# Patient Record
Sex: Female | Born: 1970
Health system: Southern US, Community
[De-identification: ages and names within clinical notes are randomized; demographics above are authoritative.]

## PROBLEM LIST (undated history)

## (undated) DIAGNOSIS — F32A Depression, unspecified: Secondary | ICD-10-CM

## (undated) DIAGNOSIS — K7581 Nonalcoholic steatohepatitis (NASH): Secondary | ICD-10-CM

## (undated) DIAGNOSIS — N6012 Diffuse cystic mastopathy of left breast: Secondary | ICD-10-CM

## (undated) DIAGNOSIS — E119 Type 2 diabetes mellitus without complications: Secondary | ICD-10-CM

## (undated) DIAGNOSIS — I10 Essential (primary) hypertension: Secondary | ICD-10-CM

## (undated) DIAGNOSIS — M069 Rheumatoid arthritis, unspecified: Secondary | ICD-10-CM

## (undated) DIAGNOSIS — R7303 Prediabetes: Secondary | ICD-10-CM

## (undated) DIAGNOSIS — Z87448 Personal history of other diseases of urinary system: Secondary | ICD-10-CM

## (undated) DIAGNOSIS — K760 Fatty (change of) liver, not elsewhere classified: Secondary | ICD-10-CM

## (undated) DIAGNOSIS — K635 Polyp of colon: Secondary | ICD-10-CM

## (undated) DIAGNOSIS — A630 Anogenital (venereal) warts: Secondary | ICD-10-CM

## (undated) DIAGNOSIS — F329 Major depressive disorder, single episode, unspecified: Secondary | ICD-10-CM

## (undated) DIAGNOSIS — F419 Anxiety disorder, unspecified: Secondary | ICD-10-CM

## (undated) DIAGNOSIS — K219 Gastro-esophageal reflux disease without esophagitis: Secondary | ICD-10-CM

## (undated) DIAGNOSIS — N951 Menopausal and female climacteric states: Secondary | ICD-10-CM

## (undated) HISTORY — DX: Anogenital (venereal) warts: A63.0

## (undated) HISTORY — PX: TOE SURGERY: SHX1073

## (undated) HISTORY — DX: Nonalcoholic steatohepatitis (NASH): K75.81

## (undated) HISTORY — PX: ABDOMINAL HYSTERECTOMY: SHX81

## (undated) HISTORY — DX: Fatty (change of) liver, not elsewhere classified: K76.0

## (undated) HISTORY — PX: LIVER BIOPSY: SHX301

## (undated) HISTORY — DX: Rheumatoid arthritis, unspecified: M06.9

## (undated) HISTORY — DX: Depression, unspecified: F32.A

## (undated) HISTORY — DX: Personal history of other diseases of urinary system: Z87.448

## (undated) HISTORY — PX: WISDOM TOOTH EXTRACTION: SHX21

## (undated) HISTORY — DX: Diffuse cystic mastopathy of left breast: N60.12

## (undated) HISTORY — DX: Anxiety disorder, unspecified: F41.9

## (undated) HISTORY — DX: Prediabetes: R73.03

## (undated) HISTORY — DX: Major depressive disorder, single episode, unspecified: F32.9

## (undated) HISTORY — DX: Menopausal and female climacteric states: N95.1

## (undated) HISTORY — PX: FOOT SURGERY: SHX648

## (undated) HISTORY — DX: Polyp of colon: K63.5

---

## 1998-03-30 DIAGNOSIS — K7581 Nonalcoholic steatohepatitis (NASH): Secondary | ICD-10-CM

## 1998-03-30 HISTORY — DX: Nonalcoholic steatohepatitis (NASH): K75.81

## 2000-11-12 ENCOUNTER — Other Ambulatory Visit: Admission: RE | Admit: 2000-11-12 | Discharge: 2000-11-12 | Payer: Self-pay | Admitting: Obstetrics and Gynecology

## 2001-03-30 DIAGNOSIS — Z87448 Personal history of other diseases of urinary system: Secondary | ICD-10-CM

## 2001-03-30 HISTORY — DX: Personal history of other diseases of urinary system: Z87.448

## 2001-12-08 ENCOUNTER — Inpatient Hospital Stay (HOSPITAL_COMMUNITY): Admission: AD | Admit: 2001-12-08 | Discharge: 2001-12-10 | Payer: Self-pay | Admitting: Obstetrics and Gynecology

## 2003-03-31 HISTORY — PX: PANNICULECTOMY: SUR1001

## 2003-03-31 HISTORY — PX: OTHER SURGICAL HISTORY: SHX169

## 2003-09-13 ENCOUNTER — Inpatient Hospital Stay (HOSPITAL_COMMUNITY): Admission: RE | Admit: 2003-09-13 | Discharge: 2003-09-16 | Payer: Self-pay | Admitting: Obstetrics and Gynecology

## 2003-10-29 ENCOUNTER — Ambulatory Visit (HOSPITAL_COMMUNITY): Admission: RE | Admit: 2003-10-29 | Discharge: 2003-10-29 | Payer: Self-pay | Admitting: Obstetrics and Gynecology

## 2008-08-31 ENCOUNTER — Ambulatory Visit (HOSPITAL_COMMUNITY): Admission: RE | Admit: 2008-08-31 | Discharge: 2008-08-31 | Payer: Self-pay | Admitting: Obstetrics & Gynecology

## 2009-01-11 ENCOUNTER — Ambulatory Visit (HOSPITAL_COMMUNITY): Admission: RE | Admit: 2009-01-11 | Discharge: 2009-01-11 | Payer: Self-pay | Admitting: Obstetrics and Gynecology

## 2010-01-13 ENCOUNTER — Ambulatory Visit (HOSPITAL_COMMUNITY): Admission: RE | Admit: 2010-01-13 | Discharge: 2010-01-13 | Payer: Self-pay | Admitting: Obstetrics and Gynecology

## 2010-01-22 ENCOUNTER — Ambulatory Visit (HOSPITAL_COMMUNITY): Admission: RE | Admit: 2010-01-22 | Discharge: 2010-01-22 | Payer: Self-pay | Admitting: Obstetrics and Gynecology

## 2010-05-29 ENCOUNTER — Other Ambulatory Visit: Payer: Self-pay | Admitting: Obstetrics & Gynecology

## 2010-05-29 DIAGNOSIS — K625 Hemorrhage of anus and rectum: Secondary | ICD-10-CM

## 2010-05-29 DIAGNOSIS — R140 Abdominal distension (gaseous): Secondary | ICD-10-CM

## 2010-05-29 HISTORY — PX: ESOPHAGOGASTRODUODENOSCOPY: SHX1529

## 2010-05-29 HISTORY — PX: COLONOSCOPY: SHX174

## 2010-06-02 ENCOUNTER — Ambulatory Visit (HOSPITAL_COMMUNITY)
Admission: RE | Admit: 2010-06-02 | Discharge: 2010-06-02 | Disposition: A | Discharge status: Home / Self Care | Payer: BC Managed Care – PPO | Source: Ambulatory Visit | Attending: Obstetrics & Gynecology | Admitting: Obstetrics & Gynecology

## 2010-06-02 ENCOUNTER — Encounter (HOSPITAL_COMMUNITY): Payer: Self-pay

## 2010-06-02 DIAGNOSIS — R142 Eructation: Secondary | ICD-10-CM | POA: Insufficient documentation

## 2010-06-02 DIAGNOSIS — K429 Umbilical hernia without obstruction or gangrene: Secondary | ICD-10-CM | POA: Insufficient documentation

## 2010-06-02 DIAGNOSIS — R140 Abdominal distension (gaseous): Secondary | ICD-10-CM

## 2010-06-02 DIAGNOSIS — R141 Gas pain: Secondary | ICD-10-CM | POA: Insufficient documentation

## 2010-06-02 DIAGNOSIS — K625 Hemorrhage of anus and rectum: Secondary | ICD-10-CM

## 2010-06-02 DIAGNOSIS — R143 Flatulence: Secondary | ICD-10-CM | POA: Insufficient documentation

## 2010-06-02 MED ORDER — IOHEXOL 300 MG/ML  SOLN
100.0000 mL | Freq: Once | INTRAMUSCULAR | Status: AC | PRN
  Administered 2010-06-02: 100 mL via INTRAVENOUS

## 2010-06-11 ENCOUNTER — Ambulatory Visit (INDEPENDENT_AMBULATORY_CARE_PROVIDER_SITE_OTHER): Payer: BC Managed Care – PPO | Admitting: Gastroenterology

## 2010-06-11 ENCOUNTER — Encounter: Payer: Self-pay | Admitting: Gastroenterology

## 2010-06-11 DIAGNOSIS — R1013 Epigastric pain: Secondary | ICD-10-CM | POA: Insufficient documentation

## 2010-06-11 DIAGNOSIS — K7689 Other specified diseases of liver: Secondary | ICD-10-CM

## 2010-06-11 DIAGNOSIS — K921 Melena: Secondary | ICD-10-CM

## 2010-06-11 DIAGNOSIS — K7581 Nonalcoholic steatohepatitis (NASH): Secondary | ICD-10-CM | POA: Insufficient documentation

## 2010-06-13 ENCOUNTER — Encounter: Payer: Self-pay | Admitting: Internal Medicine

## 2010-06-13 ENCOUNTER — Encounter: Payer: Self-pay | Admitting: Gastroenterology

## 2010-06-16 ENCOUNTER — Other Ambulatory Visit: Payer: Self-pay | Admitting: Obstetrics and Gynecology

## 2010-06-16 DIAGNOSIS — Z09 Encounter for follow-up examination after completed treatment for conditions other than malignant neoplasm: Secondary | ICD-10-CM

## 2010-06-17 NOTE — Assessment & Plan Note (Signed)
Summary: POSTIVE HEME STOOL/RECTAL BLEEDING/LAW   Vital Signs:  Patient profile:   40 year old female Height:      61 inches Weight:      165 pounds BMI:     31.29 Temp:     98.6 degrees F oral Pulse rate:   84 / minute BP sitting:   140 / 100  (left arm)  Vitals Entered By: Loney Loh LPN (June 11, 7827 5:62 PM)  Visit Type:  Consult Referring Provider:  Sharin [facility]  Chief Complaint:  heme positive stool and rectal bleeding.  History of Present Illness: Loretta Schaefer is a pleasant 40 y/o female who presents today at request of Derrek Monaco ANP/GNP, for further evaluation of rectal bleeding/heme positive stool. Patient developed BRBPR for three days. Never had TCS. On DRE, possible internal hemorrhoid. Denies constipation, diarrhea. Does complain of bloating and upper abd discomfort. No heartburn, melena, n/v.  CT A/P was done. She had 62m hypodensity in anterior segment right hepatic lobe not seen on study in 2005. Possibly a cyst or hemangioma. Focal fat in lateral segment left hepatic lobe, 347mright mid renal cortical hypodensity, stable. She developed itching and hives with the IV contrast.   Labs 05/29/10: WBC 6500, H/H 13.9/41.1, Plt 279,000, sed rate 5, CMET normal.  Has f/u with Dr. FeJohnnye Simaffice next week to f/u BP.   Current Medications (verified): 1)  Bupropion Hcl 150 Mg Xr12h-Tab (Bupropion Hcl) .... Take One Two Times A Day 2)  Daily Multi  Tabs (Multiple Vitamins-Minerals) .... Take One Once Daily 3)  Melatonex 3-10 Mg Cr-Tabs (Melatonin-Pyridoxine) .... Take One At Bed Time 4)  Vitamin B-12 1000 Mcg Tabs (Cyanocobalamin) .... Take One Once Daily 5)  Omeprazole 10 Mg Cpdr (Omeprazole) .... Take One Prn 6)  Advil 200 Mg Tabs (Ibuprofen) .... Prn  Allergies (verified): 1)  ! * Ivp Dye  Past History:  Past Medical History: Steatohepatitis without cirrhosis, bx 2000 H/O right pyelonephritis, 2003 H/O HTN  Past Surgical  History: Hysterectomy and panniculectomy, 2005 C-section, two  Family History: Mother and maternal aunt had steatohepatitis with cirrhosis. Mother passed away two years ago. Mother had colon polyps before age of 42 Social History: Married. Two children. Nonsmoker. No alcohol. No drugs.  Review of Systems General:  Denies fever, chills, sweats, anorexia, fatigue, weakness, malaise, and weight loss. Eyes:  Denies vision loss. ENT:  Denies nasal congestion, hoarseness, and difficulty swallowing. CV:  Denies chest pains, angina, palpitations, dyspnea on exertion, and peripheral edema. Resp:  Denies dyspnea at rest, dyspnea with exercise, cough, and sputum. GI:  See HPI. GU:  Denies urinary burning and blood in urine. MS:  Denies joint pain / LOM and low back pain. Derm:  Denies rash and itching. Neuro:  Denies weakness, frequent headaches, memory loss, and confusion. Psych:  Denies depression and anxiety. Endo:  Denies unusual weight change. Heme:  Denies bruising and bleeding. Allergy:  Denies hives and rash.  Physical Exam  General:  Well developed, well nourished, no acute distress.obese.   Head:  Normocephalic and atraumatic. Eyes:  Conjunctivae pink, no scleral icterus.  Mouth:  Oropharyngeal mucosa moist, pink.  No lesions, erythema or exudate.    Neck:  Supple; no masses or thyromegaly. Lungs:  Clear throughout to auscultation. Heart:  Regular rate and rhythm; no murmurs, rubs,  or bruits. Abdomen:  Mild epigastric tenderness to deep palpation. No rebound or guarding. No HSM or masses. No abd bruit or hernia.  Rectal:  Done by PCP. Extremities:  No clubbing, cyanosis, edema or deformities noted. Neurologic:  Alert and  oriented x4;  grossly normal neurologically. Skin:  Intact without significant lesions or rashes. Cervical Nodes:  No significant cervical adenopathy. Psych:  Alert and cooperative. Normal mood and affect.   Impression & Recommendations:  Problem # 1:   BLOOD IN STOOL (ICD-578.1)  Rectal bleeding X 3 days and heme positive stool. Mother had colon polyps in her 5s. Advised need for TCS but patient would like to think about it. She will call us when she has made a decision.  Orders: Consultation Level III (98022)  Problem # 2:  ABDOMINAL PAIN, EPIGASTRIC (ICD-789.06)  Epigastric pain on exam in setting of NSAID use. CT did not explain pain. Recommended TCS +/- EGD but patient wants to think about it.   Orders: Consultation Level III ([id number])  Problem # 3:  FATTY LIVER DISEASE (ICD-571.79)  Pulled old records. Bx proven steatohepatitis in 2000 without cirrhosis. Mother had NASH cirrhosis as well as maternal aunt. Advise 25 pound weight loss over next 12 months. Previously advised to take URSO 263m two times a day but at some point she came off. Will address with her in near future.  Orders: Consultation Level III ([phone]   Orders Added: 1)  Consultation Level III [[[id number]]  [facility] part in the care of this nice patient.

## 2010-06-17 NOTE — Letter (Signed)
Summary: TCS ORDER  TCS ORDER   Imported By: Sofie Rower 06/13/2010 14:40:19  _____________________________________________________________________  External Attachment:    Type:   Image     Comment:   External Document

## 2010-06-25 ENCOUNTER — Encounter: Payer: BC Managed Care – PPO | Admitting: Internal Medicine

## 2010-06-25 ENCOUNTER — Other Ambulatory Visit: Payer: Self-pay | Admitting: Internal Medicine

## 2010-06-25 ENCOUNTER — Ambulatory Visit (HOSPITAL_COMMUNITY)
Admission: RE | Admit: 2010-06-25 | Discharge: 2010-06-25 | Disposition: A | Discharge status: Home / Self Care | Payer: BC Managed Care – PPO | Source: Ambulatory Visit | Attending: Internal Medicine | Admitting: Internal Medicine

## 2010-06-25 DIAGNOSIS — I10 Essential (primary) hypertension: Secondary | ICD-10-CM | POA: Insufficient documentation

## 2010-06-25 DIAGNOSIS — K2100 Gastro-esophageal reflux disease with esophagitis, without bleeding: Secondary | ICD-10-CM

## 2010-06-25 DIAGNOSIS — K635 Polyp of colon: Secondary | ICD-10-CM

## 2010-06-25 DIAGNOSIS — D129 Benign neoplasm of anus and anal canal: Secondary | ICD-10-CM

## 2010-06-25 DIAGNOSIS — R1013 Epigastric pain: Secondary | ICD-10-CM | POA: Insufficient documentation

## 2010-06-25 DIAGNOSIS — T3995XA Adverse effect of unspecified nonopioid analgesic, antipyretic and antirheumatic, initial encounter: Secondary | ICD-10-CM

## 2010-06-25 DIAGNOSIS — K921 Melena: Secondary | ICD-10-CM | POA: Insufficient documentation

## 2010-06-25 DIAGNOSIS — D128 Benign neoplasm of rectum: Secondary | ICD-10-CM

## 2010-06-25 DIAGNOSIS — Z800 Family history of malignant neoplasm of digestive organs: Secondary | ICD-10-CM | POA: Insufficient documentation

## 2010-06-25 DIAGNOSIS — K625 Hemorrhage of anus and rectum: Secondary | ICD-10-CM

## 2010-06-25 HISTORY — DX: Polyp of colon: K63.5

## 2010-07-06 ENCOUNTER — Encounter: Payer: Self-pay | Admitting: Internal Medicine

## 2010-07-07 ENCOUNTER — Encounter: Payer: Self-pay | Admitting: Internal Medicine

## 2010-07-08 NOTE — Op Note (Signed)
Loretta Schaefer, Loretta Schaefer               ACCOUNT NO.:  [id number]  MEDICAL RECORD NO.:  [id number]           PATIENT TYPE:  O  LOCATION:  DAYP                          FACILITY:  [facility]  PHYSICIAN:  R. Garfield Cornea, M.D. DATE OF BIRTH:  1971-01-11  DATE OF PROCEDURE:  06/25/2010 DATE OF DISCHARGE:                              OPERATIVE REPORT   Diagnostic EGD followed colonoscopy biopsy.  INDICATIONS FOR PROCEDURE:  A 40 year old lady with recent epigastric pain in the setting of NSAID use, negative CT scan.  Recently has had a bout of apparently self-limiting painless rectal bleeding, Hemoccult positive stool.  Positive family history of colon polyps in relatively young age.  EGD and colonoscopy now being done to further evaluate her symptoms.  Risks, benefits, limitations, alternatives, imponderables have been discussed, questions answered, all parties agreeable.  PROCEDURE NOTE:  O2 saturation, blood pressure, pulse, and respirations were monitored throughout the entire procedure.  CONSCIOUS SEDATION:  Versed 7 mg IV, Demerol 125 mg IV in divided doses.  INSTRUMENT:  Pentax video chip system.  FINDINGS:  Examination of the tubular esophagus revealed a small distal esophageal erosions within 5 mm at the GE junction on the esophageal side that were circumferential.  There is no Barrett esophagus or other abnormality.  Tubular esophagus was patent throughout its length.  EG junction was easily traversed.  Stomach:  Gastric cavity was emptied and insufflated well with air.  Thorough examination of gastric mucosa including retroflexion of proximal stomach and esophagogastric junction did demonstrated only a small hiatal hernia.  Pylorus was patent, easily traversed.  Examination of the bulb, second portion revealed no abnormalities.  THERAPEUTIC/DIAGNOSTIC MANEUVERS PERFORMED:  None.  The patient tolerated the procedure well, was prepared for colonoscopy. Digital rectal exam  revealed no abnormalities.  Endoscopic findings: Prep was adequate.  Colon:  Colonic mucosa was surveyed from the rectosigmoid junction through the left transverse, right colon to the appendiceal orifice, ileocecal valve/cecum.  These structures were well seen and photographed for the record.  From this level, the scope was slowly and cautiously withdrawn.  All previously mentioned mucosal surfaces were again seen.  Terminal ileum was intubated 5 cm.  The colonic mucosa as well as the terminal mucosa appeared normal.  Scope was pulled down the rectum.  Thorough examination of the rectal mucosa including retroflexed view of the anal verge on fos view of anal canal demonstrated friable anal canal and 2 proximal rectum, diminutive polyps which were cold biopsy/removed.  Remainder of the rectal mucosa appeared normal.  The patient tolerated this procedure well.  Cecal withdrawal time 9 minutes.  IMPRESSION: 1. Esophagogastroduodenoscopy distal esophageal erosions consistent     with mild erosive reflux esophagitis. 2. Small hiatal hernia, otherwise normal stomach, D1 and D2.     Colonoscopy findings friable anal canal, diminutive rectal polyp     status post cold biopsy removal, otherwise normal-appearing colon     and terminal ileum.  RECOMMENDATIONS: 1. GERD polyp literature provided to Ms. Riviera. 2. Begin Protonix 40 mg orally daily. 3. Followup on path. 4. Begin fiber supplements 1 tablespoon daily. 5. A 10-day course  of Anusol suppositories one per rectum at bedtime.  Further recommendations to follow.  [facility].     Bridgette Habermann, M.D.     RMR/MEDQ  D:  06/25/2010  T:  06/26/2010  Job:  [id number]  cc:   [facility]  Electronically Signed by Jannette Spanner M.D. on 07/08/2010 02:57:47 PM

## 2010-07-25 ENCOUNTER — Other Ambulatory Visit: Payer: Self-pay | Admitting: Obstetrics and Gynecology

## 2010-07-25 DIAGNOSIS — Z09 Encounter for follow-up examination after completed treatment for conditions other than malignant neoplasm: Secondary | ICD-10-CM

## 2010-07-30 ENCOUNTER — Ambulatory Visit (HOSPITAL_COMMUNITY): Payer: BC Managed Care – PPO

## 2010-07-30 ENCOUNTER — Ambulatory Visit (HOSPITAL_COMMUNITY)
Admission: RE | Admit: 2010-07-30 | Discharge: 2010-07-30 | Disposition: A | Discharge status: Home / Self Care | Payer: BC Managed Care – PPO | Source: Ambulatory Visit | Attending: Obstetrics and Gynecology | Admitting: Obstetrics and Gynecology

## 2010-07-30 ENCOUNTER — Other Ambulatory Visit: Payer: Self-pay | Admitting: Obstetrics and Gynecology

## 2010-07-30 DIAGNOSIS — Z09 Encounter for follow-up examination after completed treatment for conditions other than malignant neoplasm: Secondary | ICD-10-CM | POA: Insufficient documentation

## 2010-07-30 DIAGNOSIS — N6009 Solitary cyst of unspecified breast: Secondary | ICD-10-CM | POA: Insufficient documentation

## 2010-07-31 ENCOUNTER — Ambulatory Visit (INDEPENDENT_AMBULATORY_CARE_PROVIDER_SITE_OTHER): Payer: BC Managed Care – PPO | Admitting: Psychology

## 2010-07-31 ENCOUNTER — Ambulatory Visit (HOSPITAL_COMMUNITY): Payer: BC Managed Care – PPO | Admitting: Psychology

## 2010-07-31 DIAGNOSIS — F332 Major depressive disorder, recurrent severe without psychotic features: Secondary | ICD-10-CM

## 2010-08-14 ENCOUNTER — Encounter (INDEPENDENT_AMBULATORY_CARE_PROVIDER_SITE_OTHER): Payer: BC Managed Care – PPO | Admitting: Psychology

## 2010-08-14 DIAGNOSIS — F332 Major depressive disorder, recurrent severe without psychotic features: Secondary | ICD-10-CM

## 2010-08-15 NOTE — Op Note (Signed)
Loretta Schaefer, Loretta Schaefer                         ACCOUNT NO.:  [id number]   MEDICAL RECORD NO.:  [id number]                   PATIENT TYPE:  INP   LOCATION:  P950                                 FACILITY:  [facility]   PHYSICIAN:  Jonnie Kind, M.D.              DATE OF BIRTH:  1970-07-11   DATE OF PROCEDURE:  09/13/2003  DATE OF DISCHARGE:                                 OPERATIVE REPORT   PREOPERATIVE DIAGNOSES:  1. Dysmenorrhea.  2. Menorrhagia.  3. Obesity with panniculus.   POSTOPERATIVE DIAGNOSES:  1. Dysmenorrhea.  2. Menorrhagia.  3. Obesity with panniculus.   PROCEDURES:  1. Total abdominal hysterectomy.  2. Panniculectomy.   SURGEON:  Jonnie Kind, M.D.   ASSISTANTMervin Hack   ANESTHESIA:  General.   COMPLICATIONS:  None.   FINDINGS:  Very thick abdominal fat intraperitoneal, extensive right adnexal  varicosities necessitating conversion from gyrus coagulation device to  sutures.   DETAILS OF PROCEDURE:  The patient was taken to the operating room, prepped  and draped for low abdominal surgery.  The abdomen had previously been  marked in the preop area for excision of the lax and irregularly healed  lower abdominal scar which resulted from her first cesarean section.  Approximately one half of the length of the midline vertical scar from her  second cesarean section was removed with the specimen which consisted of  removing skin and underlying fatty tissue to a depth of approximately 2-3  cm, leaving a layer of intact fat overlying the fascia.  We then entered the  abdomen in the midline, being very careful to open the abdomen and the  midline in a Pelosi incision technique.  We were able to carefully enter the  abdomen without any suspicion of intra-abdominal injury, free up the omental  adhesions to the anterior abdominal wall sufficiently to allow visualization  of the pelvis.  The patient's pelvis was very deep, and there was extensive  abdominal fat  which made packing of the bowel away challenging, even despite  the bowel prep which had been used the night before.  We were able to  identify the pelvis sufficiently to place Balfour retractor and then grasped  the uterine fundus with Lahey thyroid tenaculum.  Round ligaments were taken  down with gyrus bipolar cautery device on both sides.  The right side showed  so many extensive varicosities that we were uncomfortable with the gyrus  device, and we converted to sutures which were used to march down the broad  ligament on either side.  The bladder flap was developed with some  difficulty, as there was lots of scarring from the bladder flap to the  anterior abdominal wall.  We were able to dissect the bladder off the  anterior uterine surface and find the natural cleavage plane between the  cervix and bladder.  I was able to march down in small bites using  straight  Heaney clamp, Mayo scissors transection and 0 chromic suture ligature to  march down each side of the uterus.  Upon reaching the level of the cardinal  ligaments which were incorporated into the last clamp-cutting and sutures,  we were able to then identify the anterior cervical vaginal fornix, enter  the vaginal area with a single stab incision in the anterior cervical  vaginal fornix and amputated the cervix off the vaginal cuff.  The cuff was  then closed with Aldridge stitch at each lateral vaginal angle and  interrupted 2-0 chromic sutures across the cuff edge.  Hemostasis was  satisfactory.  There was oozing from 2-3 sites where the bladder flap  adhesions had been present.  In addition, there was some oozing from  adhesions in the cul-de-sac.  All these were carefully cauterized,  maintaining care to stay away from bladder and bowel surfaces.  Tiny amounts  of point cautery were all that was necessary.  We then were able to irrigate  the pelvis, confirm hemostasis, remove laparotomy equipment and tapes,  closed the  anterior peritoneum with 2-0 chromic, closed the midline vertical  incision with 0 Vicryl, closed the subcu fatty tissue with a series of  interrupted 2-0 plain sutures while placing flat J-P drains in the subcu  space and allowing them to exit at each lateral incision angle through a  separate stab incision.  These were sutured in place.  Staple closure of the  skin completed the procedure with patient tolerating things well with  approximately 200 mL blood loss.      ___________________________________________                                            Jonnie Kind, M.D.   JVF/MEDQ  D:  09/13/2003  T:  09/14/2003  Job:  [id number]

## 2010-08-15 NOTE — Discharge Summary (Signed)
   NAME:  Loretta Schaefer, Loretta Schaefer NO.:  [id number]   MEDICAL RECORD NO.:  [id number]                    PATIENT TYPE:   LOCATION:                                       FACILITY:   PHYSICIAN:  Jonnie Kind, M.D.              DATE OF BIRTH:   DATE OF ADMISSION:  12/08/2001  DATE OF DISCHARGE:  12/10/2001                                 DISCHARGE SUMMARY   ADMISSION DIAGNOSIS:  Right pyelonephritis.   DISCHARGE DIAGNOSIS:  Right pyelonephritis.   HOSPITAL COURSE:  Upon admission, the patient had a temperature of 101.7,  pulse was 100, respirations were in the 20s and blood pressure of 130/80s.  White count was 14.4. With IV antibiotics of Ancef 1 gm IV 6 hours, the  patient was admitted and managed her pain and discomfort and temperature.  The patient responded well to the IV antibiotic therapy and was ready for  discharge on September 13. Temperatures are as follow;  on admission it was  101.7, by the next morning temperature had dropped to normal status, pulse  was down and vital signs were stable. The patient responded well to the IV  therapy and pain management and was able for discharge.   DISCHARGE MEDICATIONS:  1. Ambien for rest.  2. Levaquin 500, one by mouth every day.  3. Ambien 10.  4. Tylox 1-2 for discomfort.   The patient was followed up in the office in one week without any  complications.     [name], [name], M.D.    DL/MEDQ  D:  03/29/2002  T:  03/29/2002  Job:  [id number]

## 2010-08-15 NOTE — H&P (Signed)
   NAME:  Loretta Schaefer, Loretta Schaefer NO.:  [id number]   MEDICAL RECORD NO.:  [id number]                    PATIENT TYPE:   LOCATION:                                       FACILITY:   PHYSICIAN:  Jonnie Kind, M.D.              DATE OF BIRTH:   DATE OF ADMISSION:  12/08/2001  DATE OF DISCHARGE:                                HISTORY & PHYSICAL   ADMISSION DIAGNOSIS:  Pyelonephritis.   HISTORY OF PRESENT ILLNESS:  Loretta Schaefer is a 40 year old patient who presented  this morning with pain in her right flank and lower abdomen for two days, a  low grade temperature and in moderate distress.   PAST MEDICAL HISTORY:  Positive for fatty liver disease.   PAST SURGICAL HISTORY:  Positive for C sections x2.   PHYSICAL EXAMINATION:  Weight is 190 pounds, blood pressure is 148/80.   DATA:  Urine has 1+ blood, 1+ leukocytes, positive for nitrites and 1+  protein. She has right CVA tenderness and pea colored urine with a malodor.   PLAN:  We are going to admit, do a CBC, a urine culture and treat with IV  antibiotics.     [name], [name], M.D.    DL/MEDQ  D:  03/29/2002  T:  03/29/2002  Job:  [id number]

## 2010-08-15 NOTE — Discharge Summary (Signed)
Loretta Schaefer, Loretta Schaefer                         ACCOUNT NO.:  [id number]   MEDICAL RECORD NO.:  [id number]                   PATIENT TYPE:  INP   LOCATION:  J497                                 FACILITY:  [facility]   PHYSICIAN:  Jonnie Kind, M.D.              DATE OF BIRTH:  10-Aug-1970   DATE OF ADMISSION:  09/13/2003  DATE OF DISCHARGE:  09/16/2003                                 DISCHARGE SUMMARY   ADMITTING DIAGNOSES:  1. Menorrhagia.  2. Dysmenorrhea.  3. Obesity.   DISCHARGE DIAGNOSES:  1. Menorrhagia.  2. Dysmenorrhea.  3. Obesity.   PROCEDURE:  Total abdominal hysterectomy and panniculectomy by Jonnie Kind.   DISCHARGE MEDICATIONS:  1. Levaquin 500 mg p.o. daily x1 week.  2. Hydrochlorothiazide 25 mg daily x30 days.  3. Tylox 15 tablets, one q.4h. p.r.n. pain.   FOLLOWUP:  In one week in our office for subcu drain removal and incision  check.   HOSPITAL SUMMARY:  This 40 year old female, gravida 2, para 2 with two prior  cesarean sections was admitted for abdominal hysterectomy due to severity of  menstrual discomfort and heaviness in flow.  She was not interested in  endometrial ablation, with a history of failed birth control pill use  (failed medical therapy).   PAST MEDICAL HISTORY:  Positive for fatty liver on CT of the abdomen,  chronic back pain, hypertension the last few years, depression treated with  Wellbutrin.   SURGICAL HISTORY:  Cesarean section x2.   ALLERGIES:  No known drug allergies.   PHYSICAL EXAMINATION:  Height 5 feet, 0 inches.  Weight 190.  Blood pressure  140/92.  GENERAL:  She is a stocky, Caucasian female, alert and oriented x3.  HEENT:  Pupils equal, round and reactive.  Extraocular movements intact.  CHEST: Clear.  CARDIAC:  Exam, regular rate and rhythm.  ABDOMEN:  Stocky, vertical lower abdominal scar as well as a transverse  surgical scar.  Bowel prep has been performed.  EXTREMITIES:  Normal.   LABORATORY DATA:   Includes hemoglobin 14.4, hematocrit 40.8, sodium 138,  potassium 4.1.  Glucose 120 non fasting.  BUN  7, creatinine 0.9.  AST 57,  ALT 87, both elevated liver function tests.  Blood type is A positive.   MEDICATIONS:  1. Wellbutrin XL, 150 mg p.o. daily.  2. Prilosec over-the-counter taken p.r.n.   HOSPITAL COURSE:  The patient was taken to the operating room and underwent  total abdominal hysterectomy and panniculectomy as described in the  operative note.  Findings include a very-thick abdominal intraperitoneal  fat, extensive right adnexal varicosities.  Estimated blood loss 200 cc.  Postoperatively, the patient tolerated a PCA pump.  She had excellent  support from her husband during her care.  She had TD stockings on for 24  hours.  She was slow to resume bowel function at being postoperative day #2  before she had any  passage of flatus.  She was discharged on postoperative  day #3 on the previously mentioned medicines, with discharge hemoglobin 11.8  and hematocrit 34.0.  She was felt to have perhaps had some postoperative  intraabdominal oozing as the hematocrit drop was greater than anticipated.  The incision remained in excellent condition.  The JP drain remained with  scanty fluid.  No fatty tissue or hematoma was suspected.  She was  discharged home in stable condition on previous medicines as listed.   FOLLOWUP:  In one week for incision inspection and consideration of JP drain  removal.   PATHOLOGY REPORT:  The pathology report returned showing an 86 gram uterus,  330 gram ellipse of skin and fatty tissue with uterus showing benign  proliferative endometrium.     ___________________________________________                                         Jonnie Kind, M.D.   JVF/MEDQ  D:  10/14/2003  T:  10/14/2003  Job:  [id number]

## 2010-08-28 ENCOUNTER — Encounter (INDEPENDENT_AMBULATORY_CARE_PROVIDER_SITE_OTHER): Payer: BC Managed Care – PPO | Admitting: Psychology

## 2010-08-28 DIAGNOSIS — F332 Major depressive disorder, recurrent severe without psychotic features: Secondary | ICD-10-CM

## 2010-09-11 ENCOUNTER — Encounter (INDEPENDENT_AMBULATORY_CARE_PROVIDER_SITE_OTHER): Payer: BC Managed Care – PPO | Admitting: Psychology

## 2010-09-11 DIAGNOSIS — F332 Major depressive disorder, recurrent severe without psychotic features: Secondary | ICD-10-CM

## 2010-09-11 DIAGNOSIS — F411 Generalized anxiety disorder: Secondary | ICD-10-CM

## 2010-09-25 ENCOUNTER — Encounter (INDEPENDENT_AMBULATORY_CARE_PROVIDER_SITE_OTHER): Payer: BC Managed Care – PPO | Admitting: Psychology

## 2010-09-25 DIAGNOSIS — F411 Generalized anxiety disorder: Secondary | ICD-10-CM

## 2010-09-25 DIAGNOSIS — F332 Major depressive disorder, recurrent severe without psychotic features: Secondary | ICD-10-CM

## 2010-10-14 ENCOUNTER — Telehealth: Payer: Self-pay | Admitting: Gastroenterology

## 2010-10-14 NOTE — Telephone Encounter (Signed)
Recommend f/u OV regarding steatohepatitis and to discuss use of URSO. Appt in three months please.

## 2010-10-15 NOTE — Telephone Encounter (Signed)
appt 01/14/11 w/LSL

## 2010-10-16 ENCOUNTER — Encounter (INDEPENDENT_AMBULATORY_CARE_PROVIDER_SITE_OTHER): Payer: BC Managed Care – PPO | Admitting: Psychology

## 2010-10-16 DIAGNOSIS — F411 Generalized anxiety disorder: Secondary | ICD-10-CM

## 2010-10-16 DIAGNOSIS — F332 Major depressive disorder, recurrent severe without psychotic features: Secondary | ICD-10-CM

## 2010-11-06 ENCOUNTER — Encounter (INDEPENDENT_AMBULATORY_CARE_PROVIDER_SITE_OTHER): Payer: BC Managed Care – PPO | Admitting: Psychology

## 2010-11-06 DIAGNOSIS — F332 Major depressive disorder, recurrent severe without psychotic features: Secondary | ICD-10-CM

## 2010-11-06 DIAGNOSIS — F411 Generalized anxiety disorder: Secondary | ICD-10-CM

## 2010-11-27 ENCOUNTER — Encounter (INDEPENDENT_AMBULATORY_CARE_PROVIDER_SITE_OTHER): Payer: BC Managed Care – PPO | Admitting: Psychology

## 2010-11-27 DIAGNOSIS — F411 Generalized anxiety disorder: Secondary | ICD-10-CM

## 2010-11-27 DIAGNOSIS — F332 Major depressive disorder, recurrent severe without psychotic features: Secondary | ICD-10-CM

## 2010-12-02 ENCOUNTER — Other Ambulatory Visit: Payer: Self-pay | Admitting: Obstetrics and Gynecology

## 2010-12-02 DIAGNOSIS — Z09 Encounter for follow-up examination after completed treatment for conditions other than malignant neoplasm: Secondary | ICD-10-CM

## 2010-12-19 ENCOUNTER — Encounter (INDEPENDENT_AMBULATORY_CARE_PROVIDER_SITE_OTHER): Payer: BC Managed Care – PPO | Admitting: Psychology

## 2010-12-19 DIAGNOSIS — F411 Generalized anxiety disorder: Secondary | ICD-10-CM

## 2010-12-19 DIAGNOSIS — F332 Major depressive disorder, recurrent severe without psychotic features: Secondary | ICD-10-CM

## 2011-01-14 ENCOUNTER — Encounter: Payer: Self-pay | Admitting: Gastroenterology

## 2011-01-14 ENCOUNTER — Ambulatory Visit (INDEPENDENT_AMBULATORY_CARE_PROVIDER_SITE_OTHER): Payer: BC Managed Care – PPO | Admitting: Gastroenterology

## 2011-01-14 DIAGNOSIS — K7689 Other specified diseases of liver: Secondary | ICD-10-CM

## 2011-01-14 DIAGNOSIS — K219 Gastro-esophageal reflux disease without esophagitis: Secondary | ICD-10-CM

## 2011-01-14 MED ORDER — URSODIOL 250 MG PO TABS: 250.0000 mg | ORAL_TABLET | Freq: Three times a day (TID) | ORAL | Status: AC

## 2011-01-14 NOTE — Progress Notes (Signed)
Primary Care Physician: Glenda Chroman., MD, MD  Primary Gastroenterologist:  Garfield Cornea, MD   Chief Complaint  Patient presents with  . Follow-up    doing ok    HPI: Loretta Schaefer is a 40 y.o. female here for f/u. Since her last OV, she had EGD/TCS as outlined below.  No heartburn. No dysphagia, vomiting, abdominal pain, constipation, diarrhea, melena, brbpr. Feels good.   She has biopsy documented NASH without cirrhosis in 2000. Mother and maternal aunt both had NASH cirrhosis. CT A/P in 05/2010 showed 5 mm too small to characterize  hypodensity in the anterior segment right hepatic lobe image 11 not definitely identified on the prior study, either due to slice selection differences or interval development, most likely a cyst or hemangioma.  LFTs normal in 05/2010.  Current Outpatient Prescriptions  Medication Sig Dispense Refill  . metoprolol tartrate (LOPRESSOR) 25 MG tablet Take 25 mg by mouth 2 (two) times daily.       . pantoprazole (PROTONIX) 40 MG tablet Take 40 mg by mouth daily.         Allergies as of 01/14/2011 - Review Complete 01/14/2011  Allergen Reaction Noted  . Omnipaque (iohexol) Hives and Itching 06/02/2010    ROS:  General: Negative for anorexia, weight loss, fever, chills, fatigue, weakness. ENT: Negative for hoarseness, difficulty swallowing , nasal congestion. CV: Negative for chest pain, angina, palpitations, dyspnea on exertion, peripheral edema.  Respiratory: Negative for dyspnea at rest, dyspnea on exertion, cough, sputum, wheezing.  GI: See history of present illness. GU:  Negative for dysuria, hematuria, urinary incontinence, urinary frequency, nocturnal urination.  Endo: Negative for unusual weight change.    Physical Examination:   BP 115/80  Pulse 75  Temp(Src) 98.4 F (36.9 C) (Temporal)  Ht 5' 2"  (1.575 m)  Wt 170 lb (77.111 kg)  BMI 31.09 kg/m2  General: Well-nourished, well-developed in no acute distress.  Eyes: No  icterus. Mouth: Oropharyngeal mucosa moist and pink , no lesions erythema or exudate. Lungs: Clear to auscultation bilaterally.  Heart: Regular rate and rhythm, no murmurs rubs or gallops.  Abdomen: Bowel sounds are normal, nontender, nondistended, no hepatosplenomegaly or masses, no abdominal bruits or hernia , no rebound or guarding.   Extremities: No lower extremity edema. No clubbing or deformities. Neuro: Alert and oriented x 4   Skin: Warm and dry, no jaundice.   Psych: Alert and cooperative, normal mood and affect.  Labs:   Imaging Studies: CT from 05/2010 as above.

## 2011-01-14 NOTE — Assessment & Plan Note (Signed)
Doing well on pantoprazole.

## 2011-01-14 NOTE — Progress Notes (Signed)
Cc to PCP 

## 2011-01-14 NOTE — Assessment & Plan Note (Signed)
Bx proven NASH in 2000. FH of NASH cirrhosis. Mother deceased. Discussed NASH and possibility of progression over time to cirrhosis. Given patient's young age and FH, discussed treatment with urso. Begin urso 246m po tid with food. OV in six months with Dr. RGala Romney   Instructions for fatty liver: Recommend 1-2# weight loss per week until ideal body weight through exercise & diet. Low fat/cholesterol diet. Gradually increase exercise from 15 min daily up to 1 hr per day 5 days/week. Limit alcohol use.

## 2011-01-14 NOTE — Patient Instructions (Addendum)
Please start URSO 274m po TID with food. If well-tolerated you can take one pill in am and two in pm with food to make it easier to take. Instructions for fatty liver: Recommend 1-2# weight loss per week until ideal body weight through exercise & diet. Low fat/cholesterol diet. Gradually increase exercise from 15 min daily up to 1 hr per day 5 days/week. Limit alcohol use.  Office visit in six months with Dr. RGala Romney  Fatty Liver Hepatosteatosis, Steatohepatitis Fatty liver is the accumulation of fat in liver cells. It is also called hepatosteatosis or steatohepatitis. It is normal for your liver to contain some fat. If fat is more than 5-10% of your liver's weight, you have fatty liver.  There are often no symptoms (problems) for years while damage is still occurring. People often learn about their fatty liver when they have medical tests for other reasons. Fat can damage your liver for years or even decades without causing problems. When it becomes severe, it can cause fatigue, weight loss, weakness, and confusion. This makes you more likely to develop more serious liver problems. The liver is the largest organ in the body. It does a lot of work and often gives no warning signs when it is sick until late in a disease. The liver has many important jobs including:  Breaking down foods.   Storing vitamins, iron, and other minerals.   Making proteins.   Making bile for food digestion.   Breaking down many products including medications, alcohol and some poisons.  CAUSES There are a number of different conditions, medications, and poisons that can cause a fatty liver. Eating too many calories causes fat to build up in the liver. Not processing and breaking fats down normally may also cause this. Certain conditions, such as obesity, diabetes, and high triglycerides also cause this. Most fatty liver patients tend to be middle-aged and over weight.  Some causes of fatty liver are:  Alcohol  over consumption.  Malnutrition.   Steroid use.   Valproic acid toxicity.   Obesity.  Cushing's syndrome.   Poisons.   Tetracycline in high dosages.   Pregnancy.  Diabetes.   Hyperlipidemia.   Rapid weight loss.   Some people develop fatty liver even having none of these conditions. SYMPTOMS Fatty liver most often causes no problems. This is called asymptomatic.  It can be diagnosed with blood tests and also by a liver biopsy.   It is one of the most common causes of minor elevations of liver enzymes on routine blood tests.   Specialized Imaging of the liver using ultrasound, CT (computed tomography) scan, or MRI (magnetic resonance imaging) can suggest a fatty liver but a biopsy is needed to confirm it.   A biopsy involves taking a small sample of liver tissue. This is done by using a needle. It is then looked at under a microscope by a specialist.  TREATMENT  It is important to treat the cause. Simple fatty liver without a medical reason may not need treatment.  Weight loss, fat restriction, and exercise in overweight patients produces inconsistent results but is worth trying.   Fatty liver due to alcohol toxicity may not improve even with stopping drinking.   Good control of diabetes may reduce fatty liver.   Lower your triglycerides through diet, medication or both.   Eat a balanced, healthy diet.   Increase your physical activity.   Get regular checkups from a liver specialist.   There are no medical or surgical treatments  for a fatty liver or NASH, but improving your diet and increasing your exercise may help prevent or reverse some of the damage.  PROGNOSIS Fatty liver may cause no damage or it can lead to an inflammation of the liver. This is, called steatohepatitis. When it is linked to alcohol abuse, it is called alcoholic steatohepatitis. It often is not linked to alcohol. It is then called nonalcoholic steatohepatitis, or NASH. Over time the liver may  become scarred and hardened. This condition is called cirrhosis. Cirrhosis is serious and may lead to liver failure or cancer. NASH is one of the leading causes of cirrhosis. About 10-20% of Americans have fatty liver and a smaller 2-5% has NASH. Much of this information is from the [employer]. Last reviewed by [facility] 04-30-05 Document Released: 05/01/2005 Document Re-Released: 06/12/2008 [facility] Patient Information 2011 [place].

## 2011-01-15 ENCOUNTER — Encounter (INDEPENDENT_AMBULATORY_CARE_PROVIDER_SITE_OTHER): Payer: BC Managed Care – PPO | Admitting: Psychology

## 2011-01-15 DIAGNOSIS — F332 Major depressive disorder, recurrent severe without psychotic features: Secondary | ICD-10-CM

## 2011-01-15 DIAGNOSIS — F411 Generalized anxiety disorder: Secondary | ICD-10-CM

## 2011-02-05 ENCOUNTER — Encounter (HOSPITAL_COMMUNITY): Payer: Self-pay | Admitting: Psychology

## 2011-02-05 ENCOUNTER — Ambulatory Visit (INDEPENDENT_AMBULATORY_CARE_PROVIDER_SITE_OTHER): Payer: BC Managed Care – PPO | Admitting: Psychology

## 2011-02-05 ENCOUNTER — Encounter (HOSPITAL_COMMUNITY): Payer: BC Managed Care – PPO | Admitting: Psychology

## 2011-02-05 DIAGNOSIS — F332 Major depressive disorder, recurrent severe without psychotic features: Secondary | ICD-10-CM

## 2011-02-05 DIAGNOSIS — F411 Generalized anxiety disorder: Secondary | ICD-10-CM

## 2011-02-05 NOTE — Progress Notes (Deleted)
Patient:   Loretta Schaefer   DOB:   July 16, 1970  MR Number:  [id number]  Location:  [facility] [facility] [address] [place] [address] Dept: [phone]     Date of Service:   ***  Start Time:   *** End Time:   ***  Provider/Observer:  Edgardo Roys PSYD       Billing Code/Service: [phone]  Chief Complaint:    No chief complaint on file.   Reason for Service:  ***  Current Status:  ***  Reliability of Information: ***  Behavioral Observation: DEZIRAE SERVICE  presents as a 40 y.o.-year-old {Handed:22697} {Race/ethnicity:17218} {INFANT GENDER IN OR:22171} who appeared her stated age. her dress was {Desc;appropriate/inappropriate:5787::"Appropriate"} and she was {Appearance:22683} and her manners were {Desc;appropriate/inappropriate:5787::"Appropriate"} to the situation.  There {were/were OIN:86767} any physical disabilities noted.  she displayed an {Desc; ppropriate/inappropriate:30686::"appropriate"} level of cooperation and motivation.    Interactions:    {BHH PARTICIPATION MCNOB:09628}   Attention:   {Desc; normal/abnormal/low/high:18745}  Memory:   {Desc; normal/abnormal/low/high:18745}  Visuo-spatial:   {Desc; normal/abnormal/low/high:18745}  Speech (Volume):  {desc; low/normal/high/v ZMOQ:94765}  Speech:   {findings; speech psych:31885}  Thought Process:  {BHH THOUGHT PROCESS:22309}  Though Content:  {BHH THOUGHT CONTENT:22310}  Orientation:   {orientation:30299}  Judgment:   {BHH JUDGMENT:22312}  Planning:   {BHH JUDGMENT:22312}  Affect:    {BHH AFFECT:22266}  Mood:    {BHH MOOD:22306}  Insight:   {Insight (PAA):22695}  Intelligence:   {desc; low/normal/high/v YYTK:35465}  Marital Status/Living: ***  Current Employment: @EMPNAME @ ***  Past Employment:  ***  Substance Use:  {Substance abuse:20568}  ***  Education:   {Education:22679}  Medical History:   Past  Medical History  Diagnosis Date  . Steatohepatitis 2000    without cirrhosis  . HTN (hypertension)   . History of pyelonephritis 2003    right        Outpatient Encounter Prescriptions as of 02/05/2011  Medication Sig Dispense Refill  . metoprolol tartrate (LOPRESSOR) 25 MG tablet Take 25 mg by mouth 2 (two) times daily.       . pantoprazole (PROTONIX) 40 MG tablet Take 40 mg by mouth daily.       . ursodiol (ACTIGALL) 250 MG tablet Take 1 tablet (250 mg total) by mouth 3 (three) times daily.  90 tablet  5        ***  Sexual History:   History  Sexual Activity  . Sexually Active: Not on file    Abuse/Trauma History: ***  Psychiatric History:  ***  Family Med/Psych History:  Family History  Problem Relation Age of Onset  . Colon polyps Mother     less than age 41  . Cirrhosis Mother     NASH, deceased  . Cirrhosis Maternal Aunt     NASH    Risk of Suicide/Violence: {desc; high/low:14016} ***  Impression/DX:  ***  Disposition/Plan:  ***  Diagnosis:    Axis I:  No diagnosis found.      Axis II: {psych axis 2:31910}       Axis III:  ***      Axis IV:  {psych axis iv:31915}          Axis V:  {psych axis v score:31919}

## 2011-02-05 NOTE — Progress Notes (Signed)
Patient:  Loretta Schaefer   DOB: Jan 03, 1971  MR Number: [id number]  Location: [place]:  [address] [place],  [place], [address]  Start: 10:30 AM End: 11:30 AM  Provider/Observer:     Ilean Skill, Psy.D.   Reason For Service:     The patient was referred because of persistent and increasing symptoms of depression. The patient reports that the first symptoms she experienced a depression happened after her sister died in 2004/05/31. She reports that her mother then passed away in May 31, 2008 after the patient had taking care of her mother for the prior 2 years. She reports that her depression has worsened after this time. She was treated initially after her sister died for depression and she got better but then her depression again worsened. Her primary care physician referred her to our office.  Interventions Strategy:  Cognitive behavioral psychotherapy on an individual basis.  Participation Level:   Active  Participation Quality:  Appropriate      Behavioral Observation:  Well Groomed, Alert, and Appropriate.   Current Psychosocial Factors: The patient has ongoing conflicts and difficulties with her sister [name] who has a very manipulative style and this caused a lot of problems particularly issues with regard to the patient's overseen her mothers possessions.  Content of Session:   Review current symptoms and continue to work on therapeutic interventions around coping skills and strategies particularly with regard to issues of depression.  Current Status:   The patient reports that her depression overall has continued to improve and that she has had less crying spells and other issues.  Patient Progress:   Very good  Target Goals:   Target goals specifically have to do with reducing the frequency and duration of her depressive episodes as well as increasing her coping skills and strategies to reduce her overall level of anxiety.  Last Reviewed:   02/04/2011  Goals  Addressed Today:    We address goals around issues of depression primarily today as well as her relationship with her sister.  Impression/Diagnosis:   The patient denies a long-standing history of mood swings are consistent and persistent episodes of of depression. She doesn't now is that she has become depressed both in 05/31/04 and then again with the death of her mother in 2008/05/31.  Diagnosis:    Axis I:  1. Major depressive disorder, recurrent episode, severe   2. Anxiety state, unspecified         Axis II: No diagnosis

## 2011-02-05 NOTE — Progress Notes (Deleted)
Patient:   Loretta Schaefer   DOB:   1970-10-07  MR Number:  [id number]  Location:  [place]:  [address] [place], [place], [address]  Date of Service:   ***  Start Time:   *** End Time:   ***  Provider/Observer:  Edgardo Roys PSYD     Asir Bingley R  Billing Code/Service: @LEVELOFCARE @  Chief Complaint:    No chief complaint on file.   Reason for Service:  ***  Current Status:  ***  Reliability of Information: ***  Behavioral Observation: Loretta Schaefer  presents as a 40 y.o.-year-old {Handed:22697} {Race/ethnicity:17218} {INFANT GENDER IN OR:22171} who appeared her stated age. her dress was {Desc;appropriate/inappropriate:5787::"Appropriate"} and she was {Appearance:22683} and her manners were {Desc;appropriate/inappropriate:5787::"Appropriate"} to the situation.  There {were/were QPR:91638} any physical disabilities noted.  she displayed an {Desc; ppropriate/inappropriate:30686::"appropriate"} level of cooperation and motivation.    Interactions:    {BHH PARTICIPATION GYKZL:93570}   Attention:   {Desc; normal/abnormal/low/high:18745}  Memory:   {Desc; normal/abnormal/low/high:18745}  Visuo-spatial:   {Desc; normal/abnormal/low/high:18745}  Speech (Volume):  {desc; low/normal/high/v VXBL:39030}  Speech:   {findings; speech psych:31885}  Thought Process:  {BHH THOUGHT PROCESS:22309}  Though Content:  {BHH THOUGHT CONTENT:22310}  Orientation:   {orientation:30299}  Judgment:   {BHH JUDGMENT:22312}  Planning:   {BHH JUDGMENT:22312}  Affect:    {BHH AFFECT:22266}  Mood:    {BHH MOOD:22306}  Insight:   {Insight (PAA):22695}  Intelligence:   {desc; low/normal/high/v SPQZ:30076}  Marital Status/Living: ***  Current Employment: @EMPNAME @ ***  Past Employment:  ***  Substance Use:  {Substance abuse:20568}  ***  Education:   {Education:22679}  Medical History:   Past Medical History  Diagnosis Date  . Steatohepatitis 2000   without cirrhosis  . HTN (hypertension)   . History of pyelonephritis 2003    right        Outpatient Encounter Prescriptions as of 02/05/2011  Medication Sig Dispense Refill  . metoprolol tartrate (LOPRESSOR) 25 MG tablet Take 25 mg by mouth 2 (two) times daily.       . pantoprazole (PROTONIX) 40 MG tablet Take 40 mg by mouth daily.       . ursodiol (ACTIGALL) 250 MG tablet Take 1 tablet (250 mg total) by mouth 3 (three) times daily.  90 tablet  5        ***  Sexual History:   History  Sexual Activity  . Sexually Active: Not on file    Abuse/Trauma History: ***  Psychiatric History:  ***  Family Med/Psych History:  Family History  Problem Relation Age of Onset  . Colon polyps Mother     less than age 53  . Cirrhosis Mother     NASH, deceased  . Cirrhosis Maternal Aunt     NASH    Risk of Suicide/Violence: {desc; high/low:14016} ***  Impression/DX:  ***  Disposition/Plan:  ***  Diagnosis:    Axis I:  No diagnosis found.      Axis II: {psych axis 2:31910}       Axis III:  ***      Axis IV:  {psych axis iv:31915}          Axis V:  {psych axis v score:31919}

## 2011-02-11 ENCOUNTER — Other Ambulatory Visit (HOSPITAL_COMMUNITY): Payer: Self-pay | Admitting: Obstetrics and Gynecology

## 2011-02-11 ENCOUNTER — Ambulatory Visit (HOSPITAL_COMMUNITY)
Admission: RE | Admit: 2011-02-11 | Discharge: 2011-02-11 | Disposition: A | Discharge status: Home / Self Care | Payer: BC Managed Care – PPO | Source: Ambulatory Visit | Attending: Obstetrics and Gynecology | Admitting: Obstetrics and Gynecology

## 2011-02-11 DIAGNOSIS — N6009 Solitary cyst of unspecified breast: Secondary | ICD-10-CM | POA: Insufficient documentation

## 2011-02-11 DIAGNOSIS — Z09 Encounter for follow-up examination after completed treatment for conditions other than malignant neoplasm: Secondary | ICD-10-CM

## 2011-03-06 ENCOUNTER — Encounter (HOSPITAL_COMMUNITY): Payer: Self-pay | Admitting: Psychology

## 2011-03-06 ENCOUNTER — Ambulatory Visit (INDEPENDENT_AMBULATORY_CARE_PROVIDER_SITE_OTHER): Payer: BC Managed Care – PPO | Admitting: Psychology

## 2011-03-06 DIAGNOSIS — F419 Anxiety disorder, unspecified: Secondary | ICD-10-CM

## 2011-03-06 DIAGNOSIS — F332 Major depressive disorder, recurrent severe without psychotic features: Secondary | ICD-10-CM

## 2011-03-06 NOTE — Progress Notes (Signed)
Patient:  Loretta Schaefer   DOB: Feb 18, 1971  MR Number: [id number]  Location: [place]:  [address] [place],  [place], [address]  Start: 9:30 AM End: 10:30 AM  Provider/Observer:     Ilean Skill, Psy.D.   Reason For Service:     The patient was referred because of persistent and increasing symptoms of depression. The patient reports that the first symptoms she experienced a depression happened after her sister died in 2004-06-06. She reports that her mother then passed away in 06-06-2008 after the patient had taking care of her mother for the prior 2 years. She reports that her depression has worsened after this time. She was treated initially after her sister died for depression and she got better but then her depression again worsened. Her primary care physician referred her to our office.  Interventions Strategy:  Cognitive behavioral psychotherapy on an individual basis.  Participation Level:   Active  Participation Quality:  Appropriate      Behavioral Observation:  Well Groomed, Alert, and Appropriate.   Current Psychosocial Factors: The patient's son has moved out of the home and this has created a lot of distressing emotional response from the patient. She reports that she does feel proud about the fact that he has done so well with himself and is moving on and becoming much orphan adult and responsible. However, the fact that her kids have now moved out and she has lost her mother at [place] increasingly depressed and feeling alone.   Content of Session:   Review current symptoms and continue to work on therapeutic interventions around coping skills and strategies particularly with regard to issues of depression.  Current Status:   The patient reports that her depression overall has continued to improve and that she has had less crying spells and other issues.  Patient Progress:   Very good  Target Goals:   Target goals specifically have to do with reducing the  frequency and duration of her depressive episodes as well as increasing her coping skills and strategies to reduce her overall level of anxiety.  Last Reviewed:   03/06/2011  Goals Addressed Today:    We address goals around issues of depression primarily today as well as her relationship with her sister.  Impression/Diagnosis:   The patient denies a long-standing history of mood swings are consistent and persistent episodes of of depression. She doesn't now is that she has become depressed both in 06-06-04 and then again with the death of her mother in Jun 06, 2008.  Diagnosis:    Axis I:  1. Major depressive disorder, recurrent episode, severe   2. Anxiety         Axis II: No diagnosis

## 2011-04-01 ENCOUNTER — Ambulatory Visit (INDEPENDENT_AMBULATORY_CARE_PROVIDER_SITE_OTHER): Payer: BC Managed Care – PPO | Admitting: Psychology

## 2011-04-01 DIAGNOSIS — F419 Anxiety disorder, unspecified: Secondary | ICD-10-CM

## 2011-04-01 DIAGNOSIS — F332 Major depressive disorder, recurrent severe without psychotic features: Secondary | ICD-10-CM

## 2011-04-01 DIAGNOSIS — F411 Generalized anxiety disorder: Secondary | ICD-10-CM

## 2011-04-02 ENCOUNTER — Encounter (HOSPITAL_COMMUNITY): Payer: Self-pay | Admitting: Psychology

## 2011-04-02 NOTE — Progress Notes (Signed)
Patient:  Loretta Schaefer   DOB: March 05, 1971  MR Number: [id number]  Location: [place]:  [address] [place],  [place], [address]  Start: 11:30 AM End: 12:30 PM  Provider/Observer:     Ilean Skill, Psy.D.   Reason For Service:     The patient was referred because of persistent and increasing symptoms of depression. The patient reports that the first symptoms she experienced a depression happened after her sister died in 05-26-2004. She reports that her mother then passed away in May 26, 2008 after the patient had taking care of her mother for the prior 2 years. She reports that her depression has worsened after this time. She was treated initially after her sister died for depression and she got better but then her depression again worsened. Her primary care physician referred her to our office.  Interventions Strategy:  Cognitive behavioral psychotherapy on an individual basis.  Participation Level:   Active  Participation Quality:  Appropriate      Behavioral Observation:  Well Groomed, Alert, and Appropriate.   Current Psychosocial Factors: The patient has had more stress with regard to her son who has recently moved out and his lack of regular communications with her. He brings up feelings that he does not care about her love for her when in fact she logically knows that he does care about her quite a great deal. However, the patient continues to have these intrusive thoughts and worries and his behaviors are magnifying knees.   Content of Session:   Review current symptoms and continue to work on therapeutic interventions around coping skills and strategies particularly with regard to issues of depression.  Current Status:   The patient reports that her depression overall has continued to improve and that she has had less crying spells and other issues.  Patient Progress:   Very good  Target Goals:   Target goals specifically have to do with reducing the frequency and  duration of her depressive episodes as well as increasing her coping skills and strategies to reduce her overall level of anxiety.  Last Reviewed:   04/01/2011  Goals Addressed Today:    We address goals around issues of depression primarily today as well as her relationship with her son that has now moved out of the family home to start his adult life.  Impression/Diagnosis:   The patient denies a long-standing history of mood swings are consistent and persistent episodes of of depression. She doesn't now is that she has become depressed both in 05-26-2004 and then again with the death of her mother in 05-26-2008.  Diagnosis:    Axis I:  1. Major depressive disorder, recurrent episode, severe, without mention of psychotic behavior   2. Anxiety         Axis II: No diagnosis

## 2011-04-28 ENCOUNTER — Ambulatory Visit (INDEPENDENT_AMBULATORY_CARE_PROVIDER_SITE_OTHER): Payer: BC Managed Care – PPO | Admitting: Psychology

## 2011-04-28 DIAGNOSIS — F419 Anxiety disorder, unspecified: Secondary | ICD-10-CM

## 2011-04-28 DIAGNOSIS — F332 Major depressive disorder, recurrent severe without psychotic features: Secondary | ICD-10-CM

## 2011-04-28 DIAGNOSIS — F411 Generalized anxiety disorder: Secondary | ICD-10-CM

## 2011-04-28 NOTE — Progress Notes (Signed)
Patient:  Loretta Schaefer   DOB: Feb 01, 1971  MR Number: [id number]  Location: [facility] [address] [place] [address] Dept: [phone]  Start: 9:30 AM End: 10:30 AM  Provider/Observer:     Edgardo Roys PSYD  Chief Complaint:      Chief Complaint  Patient presents with  . Stress  . Anxiety  . Depression    Reason For Service:   The patient was referred because of persistent and increasing symptoms of depression. The patient reports that the first symptoms she experienced a depression happened after her sister died in 06/23/2004. She reports that her mother then passed away in 06/23/2008 after the patient had taking care of her mother for the prior 2 years. She reports that her depression has worsened after this time. She was treated initially after her sister died for depression and she got better but then her depression again worsened. Her primary care physician referred her to our office.   Interventions Strategy:  Cognitive/behavioral psychotherapeutic interventions  Participation Level:   Active  Participation Quality:  Appropriate      Behavioral Observation:  Well Groomed, Alert, and Appropriate.   Current Psychosocial Factors: The patient reports that she has had contact with her son on an increasing basis. She has had several conversations with them as well as text messages. The patient reports that this is better than what her fear was after he moved out. The patient reports though, however, that she has continued to experience a great deal of anxiety and worry. Increased worries are also contributing to ongoing sleep issues.  Content of Session:   Review current symptoms and continue to work on therapeutic interventions are in issues of recurrent depression and anxiety. We have also been working on sleep hygiene issues as well.  Current Status:   The patient reports that she has  continued to actively work on the therapeutic interventions we have develop. The patient reports that she has been improving overall but continues to experience significant depression and anxiety.  Patient Progress:   Overall the patient has been progressing well and continuing to meet target goals.  Target Goals:   Target goals are to decrease the overall intensity, frequency, and duration of depressive events and improved issues related to her anxiety and intrusive thoughts.  Last Reviewed:   04/28/2011  Goals Addressed Today:    Today we worked on the cognitive skills of dealing with intrusive thoughts and anxiety as well as sleep hygiene issues.  Impression/Diagnosis:   The patient denies a long-standing history of mood swings are consistent and persistent episodes of of depression. She doesn't now is that she has become depressed both in June 23, 2004 and then again with the death of her mother in Jun 23, 2008.   Diagnosis:    Axis I:  1. Major depressive disorder, recurrent episode, severe, without mention of psychotic behavior   2. Anxiety         Axis II: No diagnosis

## 2011-05-19 ENCOUNTER — Ambulatory Visit (HOSPITAL_COMMUNITY): Payer: BC Managed Care – PPO | Admitting: Psychology

## 2011-05-21 ENCOUNTER — Ambulatory Visit (INDEPENDENT_AMBULATORY_CARE_PROVIDER_SITE_OTHER): Payer: BC Managed Care – PPO | Admitting: Psychology

## 2011-05-21 DIAGNOSIS — F411 Generalized anxiety disorder: Secondary | ICD-10-CM

## 2011-05-21 DIAGNOSIS — F332 Major depressive disorder, recurrent severe without psychotic features: Secondary | ICD-10-CM

## 2011-06-17 NOTE — Progress Notes (Signed)
Patient:  Loretta Schaefer   DOB: 1970-11-27  MR Number: [id number]  Location: [facility] [address] [place] [address] Dept: [phone]  Start: 8:30 AM End: 10:30 AM  Provider/Observer:     Edgardo Roys PSYD  Chief Complaint:      Chief Complaint  Patient presents with  . Anxiety  . Depression    Reason For Service:   The patient was referred because of persistent and increasing symptoms of depression. The patient reports that the first symptoms she experienced a depression happened after her sister died in Jun 09, 2004. She reports that her mother then passed away in 09-Jun-2008 after the patient had taking care of her mother for the prior 2 years. She reports that her depression has worsened after this time. She was treated initially after her sister died for depression and she got better but then her depression again worsened. Her primary care physician referred her to our office.   Interventions Strategy:  Cognitive/behavioral psychotherapeutic interventions  Participation Level:   Active  Participation Quality:  Appropriate      Behavioral Observation:  Well Groomed, Alert, and Appropriate.   Current Psychosocial Factors: The patient reports that she is still struggling with intrusive thoughts that have contributed to worsening of her depression. She has been able to interact more with her son recently and this has been a help for her.   Content of Session:   Review current symptoms and continue to work on therapeutic interventions are in issues of recurrent depression and anxiety. We have also been working on sleep hygiene issues as well.  Current Status:   The patient reports that she has continued to actively work on the therapeutic interventions we have develop. The patient reports that she has been improving overall but continues to experience significant depression and anxiety.  Patient  Progress:   Overall the patient has been progressing well and continuing to meet target goals.  Target Goals:   Target goals are to decrease the overall intensity, frequency, and duration of depressive events and improved issues related to her anxiety and intrusive thoughts.  Last Reviewed:   05/21/2011  Goals Addressed Today:    Today we worked on the cognitive skills of dealing with intrusive thoughts and anxiety as well as sleep hygiene issues.  Impression/Diagnosis:   The patient denies a long-standing history of mood swings are consistent and persistent episodes of of depression. She doesn't now is that she has become depressed both in 2004/06/09 and then again with the death of her mother in 06-09-08.   Diagnosis:    Axis I:  1. Major depressive disorder, recurrent episode, severe, without mention of psychotic behavior   2. Anxiety state, unspecified         Axis II: No diagnosis

## 2011-06-18 ENCOUNTER — Ambulatory Visit (INDEPENDENT_AMBULATORY_CARE_PROVIDER_SITE_OTHER): Payer: BC Managed Care – PPO | Admitting: Psychology

## 2011-06-18 ENCOUNTER — Encounter (HOSPITAL_COMMUNITY): Payer: Self-pay | Admitting: Psychology

## 2011-06-18 DIAGNOSIS — F332 Major depressive disorder, recurrent severe without psychotic features: Secondary | ICD-10-CM

## 2011-06-18 DIAGNOSIS — F419 Anxiety disorder, unspecified: Secondary | ICD-10-CM

## 2011-06-18 DIAGNOSIS — F411 Generalized anxiety disorder: Secondary | ICD-10-CM

## 2011-06-18 NOTE — Progress Notes (Signed)
Patient:  Loretta Schaefer   DOB: Oct 27, 1970  MR Number: [id number]  Location: [facility] [address] [place] [address] Dept: [phone]  Start: 4 PM End: 5 PM  Provider/Observer:     Edgardo Roys PSYD  Chief Complaint:      Chief Complaint  Patient presents with  . Depression  . Anxiety    Reason For Service:   The patient was referred because of persistent and increasing symptoms of depression. The patient reports that the first symptoms she experienced a depression happened after her sister died in 06/21/04. She reports that her mother then passed away in June 21, 2008 after the patient had taking care of her mother for the prior 2 years. She reports that her depression has worsened after this time. She was treated initially after her sister died for depression and she got better but then her depression again worsened. Her primary care physician referred her to our office.   Interventions Strategy:  Cognitive/behavioral psychotherapeutic interventions  Participation Level:   Active  Participation Quality:  Appropriate      Behavioral Observation:  Well Groomed, Alert, and Appropriate.   Current Psychosocial Factors: The patient reports that she has been doing much better with her situation with her son. They are having more regular contacts and she is not feeling as overwhelmed and distressed by that. The patient also reports that she has been more worried about her daughter who is in a difficult marriage and that the patient is worried that she will need to help her out in some way if she leaves her husband.  Content of Session:   Review current symptoms and continue to work on therapeutic interventions are in issues of recurrent depression and anxiety. We have also been working on sleep hygiene issues as well.  Current Status:   The patient reports that overall her anxiety symptoms have been  improving. She continues to have episodes of depression but her worry as a whole has been decreasing.  Patient Progress:   Overall the patient has been progressing well and continuing to meet target goals.  Target Goals:   Target goals are to decrease the overall intensity, frequency, and duration of depressive events and improved issues related to her anxiety and intrusive thoughts.  Last Reviewed:   06/18/2011  Goals Addressed Today:    Today we worked on the cognitive skills of dealing with intrusive thoughts and anxiety as well as sleep hygiene issues.  Impression/Diagnosis:   The patient denies a long-standing history of mood swings are consistent and persistent episodes of of depression. She doesn't now is that she has become depressed both in 2004/06/21 and then again with the death of her mother in 2008/06/21.   Diagnosis:    Axis I:  1. Major depressive disorder, recurrent episode, severe, without mention of psychotic behavior   2. Anxiety         Axis II: No diagnosis

## 2011-07-24 ENCOUNTER — Ambulatory Visit (INDEPENDENT_AMBULATORY_CARE_PROVIDER_SITE_OTHER): Payer: BC Managed Care – PPO | Admitting: Psychology

## 2011-07-24 ENCOUNTER — Ambulatory Visit (HOSPITAL_COMMUNITY): Payer: Self-pay | Admitting: Psychology

## 2011-07-24 DIAGNOSIS — F411 Generalized anxiety disorder: Secondary | ICD-10-CM

## 2011-07-24 DIAGNOSIS — F419 Anxiety disorder, unspecified: Secondary | ICD-10-CM

## 2011-07-24 DIAGNOSIS — F332 Major depressive disorder, recurrent severe without psychotic features: Secondary | ICD-10-CM

## 2011-08-21 ENCOUNTER — Ambulatory Visit (INDEPENDENT_AMBULATORY_CARE_PROVIDER_SITE_OTHER): Payer: BC Managed Care – PPO | Admitting: Psychology

## 2011-08-21 DIAGNOSIS — F411 Generalized anxiety disorder: Secondary | ICD-10-CM

## 2011-08-21 DIAGNOSIS — F419 Anxiety disorder, unspecified: Secondary | ICD-10-CM

## 2011-08-21 DIAGNOSIS — F332 Major depressive disorder, recurrent severe without psychotic features: Secondary | ICD-10-CM

## 2011-08-28 NOTE — Progress Notes (Signed)
Patient:  Loretta Schaefer   DOB: 1971/01/18  MR Number: [id number]  Location: [facility] [address] [place] [address] Dept: [phone]  Start: 9 AM End: 10 AM  Provider/Observer:     Edgardo Roys PSYD  Chief Complaint:      Chief Complaint  Patient presents with  . Depression  . Anxiety  . Stress    Reason For Service:   The patient was referred because of persistent and increasing symptoms of depression. The patient reports that the first symptoms she experienced a depression happened after her sister died in 06/23/04. She reports that her mother then passed away in 06/23/2008 after the patient had taking care of her mother for the prior 2 years. She reports that her depression has worsened after this time. She was treated initially after her sister died for depression and she got better but then her depression again worsened. Her primary care physician referred her to our office.   Interventions Strategy:  Cognitive/behavioral psychotherapeutic interventions  Participation Level:   Active  Participation Quality:  Appropriate      Behavioral Observation:  Well Groomed, Alert, and Appropriate.   Current Psychosocial Factors: The patient reports that her psychosocial situation has been stable for the most part but she continues to stress about issues with her children. The patient reports that she is continuing to have a lot of responsibilities and that the stressors exacerbate her underlying symptoms of depression..  Content of Session:   Review current symptoms and continue to work on therapeutic interventions are in issues of recurrent depression and anxiety. We have also been working on sleep hygiene issues as well.  Current Status:   The patient reports that overall her anxiety symptoms have been improving. She continues to have episodes of depression but her worry as a whole has been  decreasing.  Patient Progress:   Overall the patient has been progressing well and continuing to meet target goals.  Target Goals:   Target goals are to decrease the overall intensity, frequency, and duration of depressive events and improved issues related to her anxiety and intrusive thoughts.  Last Reviewed:   07/24/2011  Goals Addressed Today:    Today we worked on the cognitive skills of dealing with intrusive thoughts and anxiety as well as sleep hygiene issues.  Impression/Diagnosis:   The patient denies a long-standing history of mood swings are consistent and persistent episodes of of depression. She doesn't now is that she has become depressed both in 23-Jun-2004 and then again with the death of her mother in 06-23-2008.   Diagnosis:    Axis I:  1. Major depressive disorder, recurrent episode, severe, without mention of psychotic behavior   2. Anxiety         Axis II: No diagnosis

## 2011-09-04 ENCOUNTER — Ambulatory Visit: Payer: Self-pay | Admitting: Internal Medicine

## 2011-09-11 ENCOUNTER — Ambulatory Visit: Payer: Self-pay | Admitting: Internal Medicine

## 2011-09-15 ENCOUNTER — Encounter: Payer: Self-pay | Admitting: Internal Medicine

## 2011-09-15 ENCOUNTER — Ambulatory Visit (INDEPENDENT_AMBULATORY_CARE_PROVIDER_SITE_OTHER): Payer: BC Managed Care – PPO | Admitting: Internal Medicine

## 2011-09-15 VITALS — BP 127/79 | HR 77 | Temp 98.2°F | Ht 62.0 in | Wt 172.0 lb

## 2011-09-15 DIAGNOSIS — K7581 Nonalcoholic steatohepatitis (NASH): Secondary | ICD-10-CM

## 2011-09-15 DIAGNOSIS — K7689 Other specified diseases of liver: Secondary | ICD-10-CM

## 2011-09-15 NOTE — Progress Notes (Signed)
Primary Care Physician:  Glenda Chroman., MD Primary Gastroenterologist:  Dr.   Pre-Procedure History & Physical: HPI:  Loretta Schaefer is a 41 y.o. female here for followup biopsy-proven [name]. LFTs normal back in March 2012. Patient has gained weight. She eats for comfort. She's not getting much exercise. She had labs drawn through her PCPs office recently. We do not have results. Mother had [name] cirrhosis.  Patient had prior biopsy demonstrated [name] without cirrhosis 13 years ago. Patient a couple doses of Actigall which caused abdominal cramping and she stopped taking this agent. She's not taking vitamin E.  Reflux symptoms well controlled with Protonix 40 mg daily.  Past Medical History  Diagnosis Date  . Steatohepatitis 2000    without cirrhosis  . HTN (hypertension)   . History of pyelonephritis 2003    right  . Anxiety   . Depression   . Hyperplastic colon polyp 06/25/10    colonoscopy by Dr. Gala Romney    Past Surgical History  Procedure Date  . Cesarean section     x 2  . Panniculectomy 2005  . S/p hysterectomy 2005  . Esophagogastroduodenoscopy 05/2010    distal ERE, small hh  . Colonoscopy 05/2010    normal TI, hyperplastic rectal polyp, friable anal canal    Prior to Admission medications   Medication Sig Start Date End Date Taking? Authorizing Provider  pantoprazole (PROTONIX) 40 MG tablet Take 40 mg by mouth daily.  11/25/10  Yes Historical Provider, MD  metoprolol tartrate (LOPRESSOR) 25 MG tablet Take 25 mg by mouth 2 (two) times daily.  10/12/10   Historical Provider, MD  ursodiol (ACTIGALL) 250 MG tablet Take 1 tablet (250 mg total) by mouth 3 (three) times daily. 01/14/11 01/14/12  Mahala Menghini, PA    Allergies as of 09/15/2011 - Review Complete 09/15/2011  Allergen Reaction Noted  . Omnipaque (iohexol) Hives and Itching 06/02/2010    Family History  Problem Relation Age of Onset  . Colon polyps Mother     less than age 38  . Cirrhosis Mother     NASH,  deceased  . Depression Mother   . Cirrhosis Maternal Aunt     NASH  . Bipolar disorder Sister     History   Social History  . Marital Status: Married    Spouse Name: N/A    Number of Children: N/A  . Years of Education: N/A   Occupational History  . Not on file.   Social History Main Topics  . Smoking status: Never Smoker   . Smokeless tobacco: Never Used  . Alcohol Use: No  . Drug Use: No  . Sexually Active: Not on file   Other Topics Concern  . Not on file   Social History Narrative  . No narrative on file    Review of Systems: See HPI, otherwise negative ROS  Physical Exam: BP 127/79  Pulse 77  Temp 98.2 F (36.8 C) (Temporal)  Ht 5' 2"  (1.575 m)  Wt 172 lb (78.019 kg)  BMI 31.46 kg/m2 General:   Alert,  Well-developed, well-nourished, pleasant and cooperative in NAD Skin:  Intact without significant lesions or rashes. Eyes:  Sclera clear, no icterus.   Conjunctiva pink. Ears:  Normal auditory acuity. Nose:  No deformity, discharge,  or lesions. Mouth:  No deformity or lesions. Neck:  Supple; no masses or thyromegaly. No significant cervical adenopathy. Lungs:  Clear throughout to auscultation.   No wheezes, crackles, or rhonchi. No acute distress. Heart:  Regular rate and rhythm; no murmurs, clicks, rubs,  or gallops. Abdomen: Non-distended, normal bowel sounds.  Soft and nontender without appreciable mass or hepatosplenomegaly.  Pulses:  Normal pulses noted. Extremities:  Without clubbing or edema.  Impression/Plan:   Biopsy-proven [place] without cirrhosis. LFTs normal a year and half ago. Patient is overweight and not getting adequate exercise. I reviewed the treatment approach regarding [name] with this patient in detail. Weight loss and the importance of it emphasized. Aerobic exercise 30-45 minutes 3 times weekly also emphasized.  Recommendations: Loose 15 pounds by the end of the year. Begin vitamin D 800 international units daily. Aerobic exercise 30  minutes 3 times weekly. We'll check to see if she had her recent ALT/AST. If not, will obtain a hepatic profile in the near future. Further recommendations to follow.

## 2011-09-15 NOTE — Patient Instructions (Addendum)
Loose 15 pounds by the end of the year/ aerobic exercise for 30 minutes 3x weekly; may begin Vitamin E 800 IU's daily  Continue Protonix daily  Need to see a current ALT/AST  (check with Dr. Woody Seller about recent labs - if not done, blood will need to be drawn)  Further recommendations to follow

## 2011-09-16 NOTE — Progress Notes (Signed)
Results on RMR cart- lab was done  04/23/11.

## 2011-09-16 NOTE — Progress Notes (Unsigned)
Sent request to Dr. Woody Seller for a current ALT/ALT. ( Per Dr. Gala Romney at [facility], pt needs a current one if not already done).

## 2011-09-18 ENCOUNTER — Ambulatory Visit (INDEPENDENT_AMBULATORY_CARE_PROVIDER_SITE_OTHER): Payer: BC Managed Care – PPO | Admitting: Psychology

## 2011-09-18 DIAGNOSIS — F419 Anxiety disorder, unspecified: Secondary | ICD-10-CM

## 2011-09-18 DIAGNOSIS — F411 Generalized anxiety disorder: Secondary | ICD-10-CM

## 2011-09-18 DIAGNOSIS — F332 Major depressive disorder, recurrent severe without psychotic features: Secondary | ICD-10-CM

## 2011-09-21 ENCOUNTER — Telehealth: Payer: Self-pay

## 2011-09-21 ENCOUNTER — Other Ambulatory Visit: Payer: Self-pay

## 2011-09-21 ENCOUNTER — Encounter (HOSPITAL_COMMUNITY): Payer: Self-pay | Admitting: Psychology

## 2011-09-21 DIAGNOSIS — K760 Fatty (change of) liver, not elsewhere classified: Secondary | ICD-10-CM

## 2011-09-21 NOTE — Progress Notes (Signed)
Patient:  Loretta Schaefer   DOB: 12/09/1970  MR Number: [id number]  Location: [facility] [address] [place] [address] Dept: [phone]  Start: 3 PM End: 4 PM  Provider/Observer:     Edgardo Roys PSYD  Chief Complaint:      Chief Complaint  Patient presents with  . Anxiety  . Depression  . Stress    Reason For Service:   The patient was referred because of persistent and increasing symptoms of depression. The patient reports that the first symptoms she experienced a depression happened after her sister died in 06-21-2004. She reports that her mother then passed away in 06-21-2008 after the patient had taking care of her mother for the prior 2 years. She reports that her depression has worsened after this time. She was treated initially after her sister died for depression and she got better but then her depression again worsened. Her primary care physician referred her to our office.   Interventions Strategy:  Cognitive/behavioral psychotherapeutic interventions  Participation Level:   Active  Participation Quality:  Appropriate      Behavioral Observation:  Well Groomed, Alert, and Appropriate.   Current Psychosocial Factors: The patient reports that she is doing better with regard to her symptoms of anxiety. She reports that the situation with her sister has improved somewhat in that she has been actively working on trying to cope better. However, more recently, there have been significant difficulties and frustrations with her daughter and in particular her son-in-law.  Content of Session:   Review current symptoms and continue to work on therapeutic interventions are in issues of recurrent depression and anxiety. We have also been working on sleep hygiene issues as well.  Current Status:   The patient reports that overall her anxiety symptoms have been improving. She continues to have  episodes of depression but her worry as a whole has been decreasing.  Patient Progress:   Overall the patient has been progressing well and continuing to meet target goals.  Target Goals:   Target goals are to decrease the overall intensity, frequency, and duration of depressive events and improved issues related to her anxiety and intrusive thoughts.  Last Reviewed:   08/21/2011  Goals Addressed Today:    Today we worked on the cognitive skills of dealing with intrusive thoughts and anxiety as well as sleep hygiene issues.  Impression/Diagnosis:   The patient denies a long-standing history of mood swings are consistent and persistent episodes of of depression. She doesn't now is that she has become depressed both in 06/21/2004 and then again with the death of her mother in 2008-06-21.   Diagnosis:    Axis I:  1. Major depressive disorder, recurrent episode, severe, without mention of psychotic behavior   2. Anxiety         Axis II: No diagnosis

## 2011-09-21 NOTE — Telephone Encounter (Signed)
Dr. Woody Seller did not have an updated ALT/AST. Mailing those orders to the pt. LMOM they are in the mail so she can have done in [place] if she prefers. Call if any questions.

## 2011-09-24 ENCOUNTER — Encounter (HOSPITAL_COMMUNITY): Payer: Self-pay | Admitting: Psychology

## 2011-09-24 ENCOUNTER — Other Ambulatory Visit: Payer: Self-pay

## 2011-09-24 DIAGNOSIS — K760 Fatty (change of) liver, not elsewhere classified: Secondary | ICD-10-CM

## 2011-09-24 NOTE — Progress Notes (Signed)
Patient:  Loretta Schaefer   DOB: September 20, 1970  MR Number: [id number]  Location: [facility] [address] [place] [address] Dept: [phone]  Start: 4 PM End: 5 PM  Provider/Observer:     Edgardo Roys PSYD  Chief Complaint:      Chief Complaint  Patient presents with  . Depression  . Anxiety    Reason For Service:   The patient was referred because of persistent and increasing symptoms of depression. The patient reports that the first symptoms she experienced a depression happened after her sister died in 10-Jun-2004. She reports that her mother then passed away in 2008-06-10 after the patient had taking care of her mother for the prior 2 years. She reports that her depression has worsened after this time. She was treated initially after her sister died for depression and she got better but then her depression again worsened. Her primary care physician referred her to our office.   Interventions Strategy:  Cognitive/behavioral psychotherapeutic interventions  Participation Level:   Active  Participation Quality:  Appropriate      Behavioral Observation:  Well Groomed, Alert, and Appropriate.   Current Psychosocial Factors: The patient reports that there continue to be significant psychosocial stressors going on particularly around issues of her daughter's relationship with her husband. The patient reports that she has been working on trying to be more definitive in her dealing with others and expecting more appropriate responses from them..  Content of Session:   Review current symptoms and continue to work on therapeutic interventions are in issues of recurrent depression and anxiety. We have also been working on sleep hygiene issues as well.  Current Status:   The patient reports that overall her anxiety symptoms have been improving. She continues to have episodes of depression but her worry as a  whole has been decreasing.  Patient Progress:   Overall the patient has been progressing well and continuing to meet target goals.  Target Goals:   Target goals are to decrease the overall intensity, frequency, and duration of depressive events and improved issues related to her anxiety and intrusive thoughts.  Last Reviewed:   09/18/2011  Goals Addressed Today:    Today we worked on the cognitive skills of dealing with intrusive thoughts and anxiety as well as sleep hygiene issues.  Impression/Diagnosis:   The patient denies a long-standing history of mood swings are consistent and persistent episodes of of depression. She doesn't now is that she has become depressed both in 06/10/04 and then again with the death of her mother in 10-Jun-2008.   Diagnosis:    Axis I:  1. Major depressive disorder, recurrent episode, severe, without mention of psychotic behavior   2. Anxiety         Axis II: No diagnosis

## 2011-09-25 LAB — AST
ALT: 43 U/L — AB (ref 7–35)
AST: 35 U/L

## 2011-09-29 ENCOUNTER — Telehealth: Payer: Self-pay | Admitting: Internal Medicine

## 2011-09-29 NOTE — Telephone Encounter (Signed)
Pt called this afternoon to check on her lab results. Please return her call at [phone]

## 2011-09-29 NOTE — Telephone Encounter (Signed)
Called [facility]- requested copy of labs. They are going to fax [place] a copy. Pt is aware that we dont have the results yet and we will call her as soon as someone looks at it.

## 2011-10-05 ENCOUNTER — Telehealth: Payer: Self-pay | Admitting: Internal Medicine

## 2011-10-05 NOTE — Telephone Encounter (Signed)
Patient has called the office again asking for her lab results. I told her that RMR and his nurse will be back tomorrow and JL will call her as soon as RMR reviews them. Please call her at [phone]

## 2011-10-05 NOTE — Telephone Encounter (Signed)
The labs are in RMR box. He will be in the office on Tuesday.

## 2011-10-06 ENCOUNTER — Telehealth: Payer: Self-pay | Admitting: Internal Medicine

## 2011-10-06 NOTE — Telephone Encounter (Signed)
Patient is calling for lab results from [facility] and I told her the results were on Dr. Coralee North desk and he is reviewing them and as soon as he gets back to his nurse Almyra Free with results she will give patient a call back

## 2011-10-06 NOTE — Telephone Encounter (Signed)
Routed to RMR. Labs are on cart.

## 2011-10-07 ENCOUNTER — Other Ambulatory Visit: Payer: Self-pay | Admitting: Internal Medicine

## 2011-10-07 DIAGNOSIS — R7989 Other specified abnormal findings of blood chemistry: Secondary | ICD-10-CM

## 2011-10-07 NOTE — Telephone Encounter (Signed)
Tried to call pt back, RMR answered in lab section. Will continue to try to reach pt.

## 2011-10-13 ENCOUNTER — Encounter: Payer: Self-pay | Admitting: Internal Medicine

## 2011-10-19 ENCOUNTER — Ambulatory Visit (INDEPENDENT_AMBULATORY_CARE_PROVIDER_SITE_OTHER): Payer: BC Managed Care – PPO | Admitting: Psychology

## 2011-10-19 DIAGNOSIS — F332 Major depressive disorder, recurrent severe without psychotic features: Secondary | ICD-10-CM

## 2011-10-19 DIAGNOSIS — F411 Generalized anxiety disorder: Secondary | ICD-10-CM

## 2011-10-19 DIAGNOSIS — F419 Anxiety disorder, unspecified: Secondary | ICD-10-CM

## 2011-11-16 ENCOUNTER — Ambulatory Visit (INDEPENDENT_AMBULATORY_CARE_PROVIDER_SITE_OTHER): Payer: BC Managed Care – PPO | Admitting: Psychology

## 2011-11-16 DIAGNOSIS — F419 Anxiety disorder, unspecified: Secondary | ICD-10-CM

## 2011-11-16 DIAGNOSIS — F411 Generalized anxiety disorder: Secondary | ICD-10-CM

## 2011-11-16 DIAGNOSIS — F332 Major depressive disorder, recurrent severe without psychotic features: Secondary | ICD-10-CM

## 2011-11-17 ENCOUNTER — Encounter (HOSPITAL_COMMUNITY): Payer: Self-pay | Admitting: Psychology

## 2011-11-17 NOTE — Progress Notes (Signed)
Patient:  Loretta Schaefer   DOB: 30-Oct-1970  MR Number: [id number]  Location: [facility] [address] [place] [address] Dept: [phone]  Start: 4 PM End: 5 PM  Provider/Observer:     Edgardo Roys PSYD  Chief Complaint:      Chief Complaint  Patient presents with  . Anxiety  . Depression  . Stress    Reason For Service:   The patient was referred because of persistent and increasing symptoms of depression. The patient reports that the first symptoms she experienced a depression happened after her sister died in 05-Jun-2004. She reports that her mother then passed away in 05-Jun-2008 after the patient had taking care of her mother for the prior 2 years. She reports that her depression has worsened after this time. She was treated initially after her sister died for depression and she got better but then her depression again worsened. Her primary care physician referred her to our office.   Interventions Strategy:  Cognitive/behavioral psychotherapeutic interventions  Participation Level:   Active  Participation Quality:  Appropriate      Behavioral Observation:  Well Groomed, Alert, and Appropriate.   Current Psychosocial Factors: The patient reports that she has been working on some of our basic coping skills around dealing with her family. The patient reports that she has been having significant stress with her sister who has her own set of difficulties that the patient is very stressed about.  Content of Session:   Review current symptoms and continue to work on therapeutic interventions are in issues of recurrent depression and anxiety. We have also been working on sleep hygiene issues as well.  Current Status:   The patient reports continued improvement but family stressors and other psychosocial stressors continue to be present..  Patient Progress:   Overall the patient has been progressing  well and continuing to meet target goals.  Target Goals:   Target goals are to decrease the overall intensity, frequency, and duration of depressive events and improved issues related to her anxiety and intrusive thoughts.  Last Reviewed:   10/19/2011  Goals Addressed Today:    Today we worked on the cognitive skills of dealing with intrusive thoughts and anxiety as well as sleep hygiene issues.  Impression/Diagnosis:   The patient denies a long-standing history of mood swings are consistent and persistent episodes of of depression. She doesn't now is that she has become depressed both in 06-05-2004 and then again with the death of her mother in June 05, 2008.   Diagnosis:    Axis I:  1. Major depressive disorder, recurrent episode, severe, without mention of psychotic behavior   2. Anxiety         Axis II: No diagnosis

## 2011-11-17 NOTE — Progress Notes (Signed)
Patient:  Loretta Schaefer   DOB: 1971-01-30  MR Number: [id number]  Location: [facility] [address] [place] [address] Dept: [phone]  Start: 4 PM End: 5 PM  Provider/Observer:     Edgardo Roys PSYD  Chief Complaint:      Chief Complaint  Patient presents with  . Depression  . Anxiety  . Stress    Reason For Service:   The patient was referred because of persistent and increasing symptoms of depression. The patient reports that the first symptoms she experienced a depression happened after her sister died in 05/29/04. She reports that her mother then passed away in 05/29/08 after the patient had taking care of her mother for the prior 2 years. She reports that her depression has worsened after this time. She was treated initially after her sister died for depression and she got better but then her depression again worsened. Her primary care physician referred her to our office.   Interventions Strategy:  Cognitive/behavioral psychotherapeutic interventions  Participation Level:   Active  Participation Quality:  Appropriate      Behavioral Observation:  Well Groomed, Alert, and Appropriate.   Current Psychosocial Factors: The patient reports that she had lunch with her sister today and it did not go as planned. She reports that she plans on talking with her about the way her sister constantly takes advantage of the patient and shows little regard for her needs Judithe Modest..  Content of Session:   Review current symptoms and continue to work on therapeutic interventions are in issues of recurrent depression and anxiety. We have also been working on sleep hygiene issues as well.  Current Status:   The patient reports continued improvement but family stressors and other psychosocial stressors continue to be present..  Patient Progress:   Overall the patient has been progressing well and  continuing to meet target goals.  Target Goals:   Target goals are to decrease the overall intensity, frequency, and duration of depressive events and improved issues related to her anxiety and intrusive thoughts.  Last Reviewed:   11/16/2011  Goals Addressed Today:    Today we worked on the cognitive skills of dealing with intrusive thoughts and anxiety as well as sleep hygiene issues.  Impression/Diagnosis:   The patient denies a long-standing history of mood swings are consistent and persistent episodes of of depression. She doesn't now is that she has become depressed both in 05-29-04 and then again with the death of her mother in 2008-05-29.   Diagnosis:    Axis I:  1. Major depressive disorder, recurrent episode, severe, without mention of psychotic behavior   2. Anxiety         Axis II: No diagnosis

## 2011-12-18 ENCOUNTER — Ambulatory Visit (HOSPITAL_COMMUNITY): Payer: Self-pay | Admitting: Psychology

## 2011-12-24 ENCOUNTER — Other Ambulatory Visit: Payer: Self-pay | Admitting: Obstetrics and Gynecology

## 2011-12-24 DIAGNOSIS — Z139 Encounter for screening, unspecified: Secondary | ICD-10-CM

## 2011-12-30 ENCOUNTER — Ambulatory Visit (INDEPENDENT_AMBULATORY_CARE_PROVIDER_SITE_OTHER): Payer: BC Managed Care – PPO | Admitting: Psychology

## 2011-12-30 DIAGNOSIS — F411 Generalized anxiety disorder: Secondary | ICD-10-CM

## 2011-12-30 DIAGNOSIS — F419 Anxiety disorder, unspecified: Secondary | ICD-10-CM

## 2011-12-30 DIAGNOSIS — F332 Major depressive disorder, recurrent severe without psychotic features: Secondary | ICD-10-CM

## 2011-12-31 ENCOUNTER — Encounter (HOSPITAL_COMMUNITY): Payer: Self-pay | Admitting: Psychology

## 2011-12-31 NOTE — Progress Notes (Signed)
Patient:  Loretta Schaefer   DOB: [birth year]  MR Number: [id number]  Location: [facility] [address] [place] [address] Dept: [phone]  Start: 4 PM End: 5 PM  Provider/Observer:     Edgardo Roys PSYD  Chief Complaint:      Chief Complaint  Patient presents with  . Anxiety  . Depression    Reason For Service:   The patient was referred because of persistent and increasing symptoms of depression. The patient reports that the first symptoms she experienced a depression happened after her sister died in 06-04-2004. She reports that her mother then passed away in 2008-06-04 after the patient had taking care of her mother for the prior 2 years. She reports that her depression has worsened after this time. She was treated initially after her sister died for depression and she got better but then her depression again worsened. Her primary care physician referred her to our office.   Interventions Strategy:  Cognitive/behavioral psychotherapeutic interventions  Participation Level:   Active  Participation Quality:  Appropriate      Behavioral Observation:  Well Groomed, Alert, and Appropriate.   Current Psychosocial Factors: The patient is still having stress with family members especially her son-in-law.  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  Content of Session:   Review current symptoms and continue to work on therapeutic interventions are in issues of recurrent depression and anxiety. We have also been working on sleep hygiene issues as well.  Current Status:   The patient reports continued improvement but family stressors and other psychosocial stressors continue to be present..  Patient Progress:   Overall the patient has been progressing well and continuing to meet target goals.  Target Goals:   Target goals are to decrease the overall intensity, frequency, and duration  of depressive events and improved issues related to her anxiety and intrusive thoughts.  Last Reviewed:   12/31/2011  Goals Addressed Today:    Today we worked on the cognitive skills of dealing with intrusive thoughts and anxiety as well as sleep hygiene issues.  Impression/Diagnosis:   The patient denies a long-standing history of mood swings are consistent and persistent episodes of of depression. She doesn't now is that she has become depressed both in 06-04-04 and then again with the death of her mother in 2008/06/04.   Diagnosis:    Axis I:  1. Major depressive disorder, recurrent episode, severe, without mention of psychotic behavior   2. Anxiety         Axis II: No diagnosis

## 2012-02-02 ENCOUNTER — Ambulatory Visit (HOSPITAL_COMMUNITY): Payer: Self-pay | Admitting: Psychology

## 2012-02-10 ENCOUNTER — Ambulatory Visit (INDEPENDENT_AMBULATORY_CARE_PROVIDER_SITE_OTHER): Payer: BC Managed Care – PPO | Admitting: Psychology

## 2012-02-10 DIAGNOSIS — F332 Major depressive disorder, recurrent severe without psychotic features: Secondary | ICD-10-CM

## 2012-02-10 DIAGNOSIS — F419 Anxiety disorder, unspecified: Secondary | ICD-10-CM

## 2012-02-10 DIAGNOSIS — F411 Generalized anxiety disorder: Secondary | ICD-10-CM

## 2012-02-12 ENCOUNTER — Ambulatory Visit (HOSPITAL_COMMUNITY)
Admission: RE | Admit: 2012-02-12 | Discharge: 2012-02-12 | Disposition: A | Discharge status: Home / Self Care | Payer: BC Managed Care – PPO | Source: Ambulatory Visit | Attending: Obstetrics and Gynecology | Admitting: Obstetrics and Gynecology

## 2012-02-12 DIAGNOSIS — Z1231 Encounter for screening mammogram for malignant neoplasm of breast: Secondary | ICD-10-CM | POA: Insufficient documentation

## 2012-02-12 DIAGNOSIS — Z139 Encounter for screening, unspecified: Secondary | ICD-10-CM

## 2012-02-19 ENCOUNTER — Encounter (HOSPITAL_COMMUNITY): Payer: Self-pay | Admitting: Psychology

## 2012-02-19 NOTE — Progress Notes (Signed)
Patient:  Loretta Schaefer   DOB: 05/15/1970  MR Number: [id number]  Location: [facility] [address] [place] [address] Dept: [phone]  Start: 4 PM End: 5 PM  Provider/Observer:     Edgardo Roys PSYD  Chief Complaint:      Chief Complaint  Patient presents with  . Anxiety  . Depression    Reason For Service:   The patient was referred because of persistent and increasing symptoms of depression. The patient reports that the first symptoms she experienced a depression happened after her sister died in 2004/06/12. She reports that her mother then passed away in 2008/06/12 after the patient had taking care of her mother for the prior 2 years. She reports that her depression has worsened after this time. She was treated initially after her sister died for depression and she got better but then her depression again worsened. Her primary care physician referred her to our office.   Interventions Strategy:  Cognitive/behavioral psychotherapeutic interventions  Participation Level:   Active  Participation Quality:  Appropriate      Behavioral Observation:  Well Groomed, Alert, and Appropriate.   Current Psychosocial Factors: The patient comes in today and reports that she is continuing to have major stressors with regard to her sister. She reports that she did go out to lunch with her sister recently but was not able to confront her as this is the typical pattern. We ended up going over this issue when using a therapeutic intervention point..  Content of Session:   Review current symptoms and continue to work on therapeutic interventions are in issues of recurrent depression and anxiety. We have also been working on sleep hygiene issues as well.  Current Status:   The patient reports continued improvement but family stressors and other psychosocial stressors continue to be present..  Patient  Progress:   Overall the patient has been progressing well and continuing to meet target goals.  Target Goals:   Target goals are to decrease the overall intensity, frequency, and duration of depressive events and improved issues related to her anxiety and intrusive thoughts.  Last Reviewed:   02/10/2012  Goals Addressed Today:    Today we worked on the cognitive skills of dealing with intrusive thoughts and anxiety as well as sleep hygiene issues.  Impression/Diagnosis:   The patient denies a long-standing history of mood swings are consistent and persistent episodes of of depression. She doesn't now is that she has become depressed both in 06-12-2004 and then again with the death of her mother in 06/12/08.   Diagnosis:    Axis I:  1. Major depressive disorder, recurrent episode, severe, without mention of psychotic behavior   2. Anxiety         Axis II: No diagnosis

## 2012-03-01 ENCOUNTER — Other Ambulatory Visit: Payer: Self-pay

## 2012-03-01 DIAGNOSIS — R7989 Other specified abnormal findings of blood chemistry: Secondary | ICD-10-CM

## 2012-03-14 ENCOUNTER — Ambulatory Visit (INDEPENDENT_AMBULATORY_CARE_PROVIDER_SITE_OTHER): Payer: BC Managed Care – PPO | Admitting: Psychology

## 2012-03-14 DIAGNOSIS — F419 Anxiety disorder, unspecified: Secondary | ICD-10-CM

## 2012-03-14 DIAGNOSIS — F411 Generalized anxiety disorder: Secondary | ICD-10-CM

## 2012-03-14 DIAGNOSIS — F332 Major depressive disorder, recurrent severe without psychotic features: Secondary | ICD-10-CM

## 2012-03-15 ENCOUNTER — Encounter (HOSPITAL_COMMUNITY): Payer: Self-pay | Admitting: Psychology

## 2012-03-15 LAB — HEPATIC FUNCTION PANEL
ALT: 12 U/L (ref 0–35)
AST: 16 U/L (ref 0–37)
Albumin: 4.7 g/dL (ref 3.5–5.2)
Alkaline Phosphatase: 42 U/L (ref 39–117)
Bilirubin, Direct: 0.1 mg/dL (ref 0.0–0.3)

## 2012-03-15 NOTE — Progress Notes (Signed)
Patient:  Loretta Schaefer   DOB: 01-26-1971  MR Number: [id number]  Location: [facility] [address] [place] [address] Dept: [phone]  Start: 4 PM End: 5 PM  Provider/Observer:     Edgardo Roys PSYD  Chief Complaint:      Chief Complaint  Patient presents with  . Anxiety  . Depression    Reason For Service:   The patient was referred because of persistent and increasing symptoms of depression. The patient reports that the first symptoms she experienced a depression happened after her sister died in 06/09/04. She reports that her mother then passed away in 2008-06-09 after the patient had taking care of her mother for the prior 2 years. She reports that her depression has worsened after this time. She was treated initially after her sister died for depression and she got better but then her depression again worsened. Her primary care physician referred her to our office.   Interventions Strategy:  Cognitive/behavioral psychotherapeutic interventions  Participation Level:   Active  Participation Quality:  Appropriate      Behavioral Observation:  Well Groomed, Alert, and Appropriate.   Current Psychosocial Factors: T the patient reports that she has had some limited contact with her sister. He reports these gone particularly well but she has been practicing dictation skills with her..  Content of Session:   Review current symptoms and continue to work on therapeutic interventions are in issues of recurrent depression and anxiety. We have also been working on sleep hygiene issues as well.  Current Status:   Patient continues to report improvement in her overall level of depression and frequency of significant anxiety..  Patient Progress:   Overall the patient has been progressing well and continuing to meet target goals.  Target Goals:   Target goals are to decrease the overall intensity,  frequency, and duration of depressive events and improved issues related to her anxiety and intrusive thoughts.  Last Reviewed:   03/14/2012  Goals Addressed Today:    Today we worked on the cognitive skills of dealing with intrusive thoughts and anxiety as well as sleep hygiene issues.  Impression/Diagnosis:   The patient denies a long-standing history of mood swings are consistent and persistent episodes of of depression. She doesn't now is that she has become depressed both in 2004-06-09 and then again with the death of her mother in 2008-06-09.   Diagnosis:    Axis I:  1. Major depressive disorder, recurrent episode, severe, without mention of psychotic behavior   2. Anxiety         Axis II: No diagnosis

## 2012-03-17 ENCOUNTER — Other Ambulatory Visit: Payer: Self-pay | Admitting: Internal Medicine

## 2012-03-17 DIAGNOSIS — K7689 Other specified diseases of liver: Secondary | ICD-10-CM

## 2012-04-12 ENCOUNTER — Ambulatory Visit (INDEPENDENT_AMBULATORY_CARE_PROVIDER_SITE_OTHER): Payer: BC Managed Care – PPO | Admitting: Psychology

## 2012-04-12 DIAGNOSIS — F411 Generalized anxiety disorder: Secondary | ICD-10-CM

## 2012-04-12 DIAGNOSIS — F332 Major depressive disorder, recurrent severe without psychotic features: Secondary | ICD-10-CM

## 2012-04-12 DIAGNOSIS — F419 Anxiety disorder, unspecified: Secondary | ICD-10-CM

## 2012-04-14 ENCOUNTER — Encounter (HOSPITAL_COMMUNITY): Payer: Self-pay | Admitting: Psychology

## 2012-04-14 DIAGNOSIS — F419 Anxiety disorder, unspecified: Secondary | ICD-10-CM | POA: Insufficient documentation

## 2012-04-14 NOTE — Progress Notes (Signed)
Patient:  Loretta Schaefer   DOB: [birth year]  MR Number: [id number]  Location: [facility] [address] [place] [address] Dept: [phone]  Start: 4 PM End: 5 PM  Provider/Observer:     Edgardo Roys PSYD  Chief Complaint:      Chief Complaint  Patient presents with  . Anxiety  . Depression    Reason For Service:   The patient was referred because of persistent and increasing symptoms of depression. The patient reports that the first symptoms she experienced a depression happened after her sister died in 06-14-04. She reports that her mother then passed away in 14-Jun-2008 after the patient had taking care of her mother for the prior 2 years. She reports that her depression has worsened after this time. She was treated initially after her sister died for depression and she got better but then her depression again worsened. Her primary care physician referred her to our office.   Interventions Strategy:  Cognitive/behavioral psychotherapeutic interventions  Participation Level:   Active  Participation Quality:  Appropriate      Behavioral Observation:  Well Groomed, Alert, and Appropriate.   Current Psychosocial Factors: The patient reports that she has been a good deal of time with her family over the holidays and things went generally well. They were able to work out some of the issues going on.  Content of Session:   Review current symptoms and continue to work on therapeutic interventions are in issues of recurrent depression and anxiety. We have also been working on sleep hygiene issues as well.  Current Status:   Patient continues to report improvement in her overall level of depression and frequency of significant anxiety.  Patient Progress:   Overall the patient has been progressing well and continuing to meet target goals.  Target Goals:   Target goals are to decrease the overall  intensity, frequency, and duration of depressive events and improved issues related to her anxiety and intrusive thoughts.  Last Reviewed:   04/14/2012   Addressed Today:    Today we worked on the cognitive skills of dealing with intrusive thoughts and anxiety as well as sleep hygiene issues.  Impression/Diagnosis:   The patient denies a long-standing history of mood swings are consistent and persistent episodes of of depression. She doesn't now is that she has become depressed both in 2004/06/14 and then again with the death of her mother in 06/14/2008.   Diagnosis:    Axis I:  1. Major depressive disorder, recurrent episode, severe, without mention of psychotic behavior   2. Anxiety         Axis II: No diagnosis

## 2012-05-13 ENCOUNTER — Ambulatory Visit (HOSPITAL_COMMUNITY): Payer: Self-pay | Admitting: Psychology

## 2012-05-25 ENCOUNTER — Encounter (HOSPITAL_COMMUNITY): Payer: Self-pay | Admitting: Psychology

## 2012-05-25 ENCOUNTER — Ambulatory Visit (INDEPENDENT_AMBULATORY_CARE_PROVIDER_SITE_OTHER): Payer: BC Managed Care – PPO | Admitting: Psychology

## 2012-05-25 DIAGNOSIS — F411 Generalized anxiety disorder: Secondary | ICD-10-CM

## 2012-05-25 DIAGNOSIS — F332 Major depressive disorder, recurrent severe without psychotic features: Secondary | ICD-10-CM

## 2012-05-25 NOTE — Progress Notes (Signed)
Patient:  Loretta Schaefer   DOB: [birth year]  MR Number: [id number]  Location: [facility] [address] [place] [address] Dept: [phone]  Start: 9 AM End: 10 AM  Provider/Observer:     Edgardo Roys PSYD  Chief Complaint:      Chief Complaint  Patient presents with  . Depression  . Anxiety    Reason For Service:   The patient was referred because of persistent and increasing symptoms of depression. The patient reports that the first symptoms she experienced a depression happened after her sister died in 03-Jun-2004. She reports that her mother then passed away in June 03, 2008 after the patient had taking care of her mother for the prior 2 years. She reports that her depression has worsened after this time. She was treated initially after her sister died for depression and she got better but then her depression again worsened. Her primary care physician referred her to our office.   Interventions Strategy:  Cognitive/behavioral psychotherapeutic interventions  Participation Level:   Active  Participation Quality:  Appropriate      Behavioral Observation:  Well Groomed, Alert, and Appropriate.   Current Psychosocial Factors: The patient is still having some issues with her sister as well as her husband having issues with his sister.  Worked on coping issues around these areas..  Content of Session:   Review current symptoms and continue to work on therapeutic interventions are in issues of recurrent depression and anxiety. We have also been working on sleep hygiene issues as well.  Current Status:   Patient continues to report improvement in her overall level of depression and frequency of significant anxiety.  Patient Progress:   Overall the patient has been progressing well and continuing to meet target goals.  Target Goals:   Target goals are to decrease the overall intensity, frequency, and  duration of depressive events and improved issues related to her anxiety and intrusive thoughts.  Last Reviewed:   05/25/2012   Addressed Today:    Today we worked on the cognitive skills of dealing with intrusive thoughts and anxiety as well as sleep hygiene issues.  Impression/Diagnosis:   The patient denies a long-standing history of mood swings are consistent and persistent episodes of of depression. She doesn't now is that she has become depressed both in 06/03/2004 and then again with the death of her mother in 06/03/2008.   Diagnosis:    Axis I:  Major depressive disorder, recurrent episode, severe, without mention of psychotic behavior  Anxiety state, unspecified      Axis II: No diagnosis

## 2012-06-22 ENCOUNTER — Ambulatory Visit (HOSPITAL_COMMUNITY): Payer: Self-pay | Admitting: Psychology

## 2012-06-29 ENCOUNTER — Ambulatory Visit (INDEPENDENT_AMBULATORY_CARE_PROVIDER_SITE_OTHER): Payer: BC Managed Care – PPO | Admitting: Psychology

## 2012-06-29 ENCOUNTER — Encounter (HOSPITAL_COMMUNITY): Payer: Self-pay | Admitting: Psychology

## 2012-06-29 DIAGNOSIS — F411 Generalized anxiety disorder: Secondary | ICD-10-CM

## 2012-06-29 DIAGNOSIS — F332 Major depressive disorder, recurrent severe without psychotic features: Secondary | ICD-10-CM

## 2012-06-29 NOTE — Progress Notes (Signed)
Patient:  Loretta Schaefer   DOB: 10/26/1970  MR Number: [id number]  Location: [facility] [address] [place] [address] Dept: [phone]  Start: 9 AM End: 10 AM  Provider/Observer:     Edgardo Roys PSYD  Chief Complaint:      Chief Complaint  Patient presents with  . Anxiety  . Depression    Reason For Service:   The patient was referred because of persistent and increasing symptoms of depression. The patient reports that the first symptoms she experienced a depression happened after her sister died in 23-Jun-2004. She reports that her mother then passed away in Jun 23, 2008 after the patient had taking care of her mother for the prior 2 years. She reports that her depression has worsened after this time. She was treated initially after her sister died for depression and she got better but then her depression again worsened. Her primary care physician referred her to our office.   Interventions Strategy:  Cognitive/behavioral psychotherapeutic interventions  Participation Level:   Active  Participation Quality:  Appropriate      Behavioral Observation:  Well Groomed, Alert, and Appropriate.   Current Psychosocial Factors: The patient reports that things have been fairly quiet for the most part. She did have a distracting or disturbing interaction with one of her relative is when during a conversation the relative expressed the need for privacy and went on to tell the patient about how the relatives husband had cheated on her years ago with another family member. The patient reports that this was quite upsetting to her and not really knowing how to respond her react to her relatives in this situation. The patient has been coping with these types of distress and emotional situations for some time with this relative.  Content of Session:   Review current symptoms and continue to work on therapeutic  interventions are in issues of recurrent depression and anxiety. We have also been working on sleep hygiene issues as well.  Current Status:   Patient continues to report improvement in her overall level of depression and frequency of significant anxiety.  Patient Progress:   Overall the patient has been progressing well and continuing to meet target goals.  Target Goals:   Target goals are to decrease the overall intensity, frequency, and duration of depressive events and improved issues related to her anxiety and intrusive thoughts.  Last Reviewed:   06/29/2012   Addressed Today:    Today we worked on the cognitive skills of dealing with intrusive thoughts and anxiety as well as sleep hygiene issues.  Impression/Diagnosis:   The patient denies a long-standing history of mood swings are consistent and persistent episodes of of depression. She doesn't now is that she has become depressed both in June 23, 2004 and then again with the death of her mother in 2008/06/23.   Diagnosis:    Axis I:  Major depressive disorder, recurrent episode, severe, without mention of psychotic behavior  Anxiety state, unspecified      Axis II: No diagnosis

## 2012-07-04 ENCOUNTER — Encounter: Payer: Self-pay | Admitting: *Deleted

## 2012-07-05 ENCOUNTER — Ambulatory Visit (INDEPENDENT_AMBULATORY_CARE_PROVIDER_SITE_OTHER): Payer: BC Managed Care – PPO | Admitting: Adult Health

## 2012-07-05 ENCOUNTER — Encounter: Payer: Self-pay | Admitting: Adult Health

## 2012-07-05 VITALS — BP 126/72 | HR 72 | Ht 61.0 in | Wt 148.5 lb

## 2012-07-05 DIAGNOSIS — Z1212 Encounter for screening for malignant neoplasm of rectum: Secondary | ICD-10-CM

## 2012-07-05 DIAGNOSIS — N898 Other specified noninflammatory disorders of vagina: Secondary | ICD-10-CM

## 2012-07-05 DIAGNOSIS — K7689 Other specified diseases of liver: Secondary | ICD-10-CM

## 2012-07-05 DIAGNOSIS — A630 Anogenital (venereal) warts: Secondary | ICD-10-CM

## 2012-07-05 DIAGNOSIS — Z01419 Encounter for gynecological examination (general) (routine) without abnormal findings: Secondary | ICD-10-CM

## 2012-07-05 LAB — HEMOCCULT GUIAC POC 1CARD (OFFICE): Fecal Occult Blood, POC: NEGATIVE

## 2012-07-05 NOTE — Progress Notes (Signed)
Patient ID: Loretta Schaefer, female   DOB: [birth year], 42 y.o.   MRN: [id number] History of Present Illness: Loretta Schaefer is a 42 year old white female in today for a physical exam.    Current Medications, Allergies, Past Medical History, Past Surgical History, Family History and Social History were reviewed in [facility] record.     Review of Systems:Patient denies any headaches, blurred vision, shortness of breath, chest pain, abdominal pain, problems with bowel movements, urination, she has noticed vaginal dryness with a decrease in libido.No joint selling noted, no recent mood changes. She does have a ?tag on right labia she wants looked at.   Physical Exam: General:  Well developed, well nourished, no acute distress Skin:  Warm and dry Neck:  Midline trachea, normal thyroid Lungs; Clear to auscultation bilaterally Breast:  No dominant palpable mass, retraction, or nipple discharge Cardiovascular: Regular rate and rhythm. Back: foot shaped mole with dark area(see dermatologist) Abdomen:  Soft, non tender, no hepatosplenomegaly, does itch at scar(try lotion daily, no rash noted). Pelvic:  External genitalia is normal in appearance, except for small wart right labia near clitoris(TCA 85 % applied).  The vagina is normal in appearance.  The cervix and uterus are absent.  No adnexal masses or tenderness noted. Rectal: Good sphincter tone, no polyps, or hemorrhoids felt.  Hemoccult negative. Extremities:  No swelling or varicosities noted Psych:  No mood changes   Impression: Yearly exam Condyloma History fatty liver, anxiety,and GERD  Plan:Wash TCA off in 2 hours Try Luvena OTC for vaginal moisture and good lubricate  Labs at PCP Mammogram yearly Colonoscopy per Dr. Gala Romney Physical in 1 year Return to clinic in 3 weeks to re check wart

## 2012-07-05 NOTE — Patient Instructions (Addendum)
Follow up 3 weeks to re check wart, go home and wash TCA off in about 2 hours Physical in 1 year,labs at PCP, mammogram yearly Sign up for my chart

## 2012-08-01 ENCOUNTER — Ambulatory Visit (INDEPENDENT_AMBULATORY_CARE_PROVIDER_SITE_OTHER): Payer: BC Managed Care – PPO | Admitting: Adult Health

## 2012-08-01 ENCOUNTER — Ambulatory Visit: Payer: BC Managed Care – PPO | Admitting: Adult Health

## 2012-08-01 ENCOUNTER — Encounter: Payer: Self-pay | Admitting: Adult Health

## 2012-08-01 VITALS — BP 120/76 | Ht 61.0 in | Wt 151.0 lb

## 2012-08-01 DIAGNOSIS — A630 Anogenital (venereal) warts: Secondary | ICD-10-CM

## 2012-08-01 NOTE — Progress Notes (Signed)
Subjective:     Patient ID: Loretta Schaefer, female   DOB: 28-Oct-1970, 42 y.o.   MRN: [id number]  HPI Loretta Schaefer is in for recheck of a wart, it is still there.  Review of Systems Patient denies any headaches, blurred vision, shortness of breath, chest pain, abdominal pain, problems with bowel movements, urination, or intercourse. She is missing her grandson who has been on vacation.   Reviewed past medical,surgical, social and family history. Reviewed medications and allergies.  Objective:   Physical Exam Blood pressure 120/76, height 5' 1"  (1.549 m), weight 151 lb (68.493 kg).Wart right labia.   TCA 85% applied to wart on right labia at top near clitoris. The wart turned white and she had some burning, she said it did not burn so much last time. Wash off in about 1-2 hours, 2 is better if can stand it. Assessment:    Condyloma    Plan:      Follow up in 3 weeks to re check wart Wash off in 1-2 hours. No sex for 3-4 days

## 2012-08-01 NOTE — Patient Instructions (Addendum)
Wash TCA off in 1- 2 hours Follow up in 3 weeks

## 2012-08-03 ENCOUNTER — Ambulatory Visit (INDEPENDENT_AMBULATORY_CARE_PROVIDER_SITE_OTHER): Payer: BC Managed Care – PPO | Admitting: Psychology

## 2012-08-03 DIAGNOSIS — F411 Generalized anxiety disorder: Secondary | ICD-10-CM

## 2012-08-03 DIAGNOSIS — F332 Major depressive disorder, recurrent severe without psychotic features: Secondary | ICD-10-CM

## 2012-08-05 ENCOUNTER — Telehealth (HOSPITAL_COMMUNITY): Payer: Self-pay

## 2012-08-18 ENCOUNTER — Encounter (HOSPITAL_COMMUNITY): Payer: Self-pay | Admitting: Psychology

## 2012-08-18 NOTE — Progress Notes (Signed)
Patient:  Loretta Schaefer   DOB: October 16, 1970  MR Number: [id number]  Location: [facility] [address] [place] [address] Dept: [phone]  Start: 4 PM End: 5 PM  Provider/Observer:     Edgardo Roys PSYD  Chief Complaint:      Chief Complaint  Patient presents with  . Anxiety  . Depression    Reason For Service:   The patient was referred because of persistent and increasing symptoms of depression. The patient reports that the first symptoms she experienced a depression happened after her sister died in May 28, 2004. She reports that her mother then passed away in 2008-05-28 after the patient had taking care of her mother for the prior 2 years. She reports that her depression has worsened after this time. She was treated initially after her sister died for depression and she got better but then her depression again worsened. Her primary care physician referred her to our office.   Interventions Strategy:  Cognitive/behavioral psychotherapeutic interventions  Participation Level:   Active  Participation Quality:  Appropriate      Behavioral Observation:  Well Groomed, Alert, and Appropriate.   Current Psychosocial Factors: The patient reports that she is still having confusing interactions with her sister but she is handling these situations better. We continue to process these issues..  Content of Session:   Review current symptoms and continue to work on therapeutic interventions are in issues of recurrent depression and anxiety. We have also been working on sleep hygiene issues as well.  Current Status:   The patient reports that her symptoms of anxiety and depressive been little bit better of the past several weeks..  Patient Progress:   Overall the patient has been progressing well and continuing to meet target goals.  Target Goals:   Target goals are to decrease the overall intensity,  frequency, and duration of depressive events and improved issues related to her anxiety and intrusive thoughts.  Last Reviewed:   08/03/2012   Addressed Today:    Today we worked on the cognitive skills of dealing with intrusive thoughts and anxiety as well as sleep hygiene issues.  Impression/Diagnosis:   The patient denies a long-standing history of mood swings are consistent and persistent episodes of of depression. She doesn't now is that she has become depressed both in 05-28-04 and then again with the death of her mother in May 28, 2008.   Diagnosis:    Axis I:  Major depressive disorder, recurrent episode, severe, without mention of psychotic behavior  Anxiety state, unspecified      Axis II: No diagnosis

## 2012-08-23 ENCOUNTER — Ambulatory Visit (INDEPENDENT_AMBULATORY_CARE_PROVIDER_SITE_OTHER): Payer: BC Managed Care – PPO | Admitting: Adult Health

## 2012-08-23 ENCOUNTER — Encounter: Payer: Self-pay | Admitting: Adult Health

## 2012-08-23 ENCOUNTER — Ambulatory Visit: Payer: BC Managed Care – PPO | Admitting: Adult Health

## 2012-08-23 VITALS — BP 128/80 | Ht 61.0 in | Wt 148.0 lb

## 2012-08-23 DIAGNOSIS — A630 Anogenital (venereal) warts: Secondary | ICD-10-CM

## 2012-08-23 NOTE — Patient Instructions (Addendum)
Use dial soap  Return prn

## 2012-08-23 NOTE — Progress Notes (Signed)
Subjective:     Patient ID: Loretta Schaefer, female   DOB: [birth year], 42 y.o.   MRN: [id number]  HPI Ashyla is back to check to see if wart is gone.  Review of Systems No complaints Reviewed past medica,surgical, social and family history. Reviewed medications and allergies.     Objective:   Physical Exam Blood pressure 128/80, height 5' 1"  (1.549 m), weight 148 lb (67.132 kg). Skin warm and dry.Pelvic: external genitalia is normal in appearance, the wart is gone.    Assessment:    Condyloma resolved    Plan:      Use dial soap Follow up prn

## 2012-09-02 ENCOUNTER — Ambulatory Visit (INDEPENDENT_AMBULATORY_CARE_PROVIDER_SITE_OTHER): Payer: BC Managed Care – PPO | Admitting: Psychology

## 2012-09-02 DIAGNOSIS — F411 Generalized anxiety disorder: Secondary | ICD-10-CM

## 2012-09-02 DIAGNOSIS — F332 Major depressive disorder, recurrent severe without psychotic features: Secondary | ICD-10-CM

## 2012-09-06 ENCOUNTER — Encounter (HOSPITAL_COMMUNITY): Payer: Self-pay | Admitting: Psychology

## 2012-09-06 NOTE — Progress Notes (Signed)
Patient:  Loretta Schaefer   DOB: 11-06-1970  MR Number: [id number]  Location: [facility] [address] [place] [address] Dept: [phone]  Start: 9 AM End: 10 AM  Provider/Observer:     Edgardo Roys PSYD  Chief Complaint:      Chief Complaint  Patient presents with  . Anxiety  . Depression  . Stress    Reason For Service:   The patient was referred because of persistent and increasing symptoms of depression. The patient reports that the first symptoms she experienced a depression happened after her sister died in 06/22/04. She reports that her mother then passed away in 06/22/08 after the patient had taking care of her mother for the prior 2 years. She reports that her depression has worsened after this time. She was treated initially after her sister died for depression and she got better but then her depression again worsened. Her primary care physician referred her to our office.   Interventions Strategy:  Cognitive/behavioral psychotherapeutic interventions  Participation Level:   Active  Participation Quality:  Appropriate      Behavioral Observation:  Well Groomed, Alert, and Appropriate.   Current Psychosocial Factors: The patient is having more stress and worry about psychosocial factors. The patient reports that her daughter and her husband have now been making has an upcoming with the patient and her husband on a vacation his fault of the. The patient and her husband had planned on going on the trip on her own to have some time together. The patient reports she doesn't particularly like spending time with her son-in-law as he tends to look for any way in which he doesn't have to pay for something. She reports she clearly feels this is the amount of time these efforts.  Content of Session:   Review current symptoms and continue to work on therapeutic interventions are in issues of  recurrent depression and anxiety. We have also been working on sleep hygiene issues as well.  Current Status:   The patient reports that her symptoms of anxiety and depressive been little bit better of the past several weeks..  Patient Progress:   Overall the patient has been progressing well and continuing to meet target goals.  Target Goals:   Target goals are to decrease the overall intensity, frequency, and duration of depressive events and improved issues related to her anxiety and intrusive thoughts.  Last Reviewed:   09/02/2012   Addressed Today:    Today we worked on the cognitive skills of dealing with intrusive thoughts and anxiety as well as sleep hygiene issues.  Impression/Diagnosis:   The patient denies a long-standing history of mood swings are consistent and persistent episodes of of depression. She doesn't now is that she has become depressed both in 06-22-2004 and then again with the death of her mother in Jun 22, 2008.   Diagnosis:    Axis I:  Major depressive disorder, recurrent episode, severe, without mention of psychotic behavior  Anxiety state, unspecified      Axis II: No diagnosis

## 2012-09-29 ENCOUNTER — Ambulatory Visit (HOSPITAL_COMMUNITY): Payer: Self-pay | Admitting: Psychology

## 2012-10-05 ENCOUNTER — Other Ambulatory Visit: Payer: Self-pay

## 2012-10-05 MED ORDER — PANTOPRAZOLE SODIUM 40 MG PO TBEC: 40.0000 mg | DELAYED_RELEASE_TABLET | Freq: Every day | ORAL | Status: DC

## 2012-10-05 NOTE — Telephone Encounter (Signed)
Patient is calling she is out of refills on her Protonix, please advise?

## 2012-10-06 ENCOUNTER — Ambulatory Visit (INDEPENDENT_AMBULATORY_CARE_PROVIDER_SITE_OTHER): Payer: BC Managed Care – PPO | Admitting: Psychology

## 2012-10-06 DIAGNOSIS — F332 Major depressive disorder, recurrent severe without psychotic features: Secondary | ICD-10-CM

## 2012-10-06 DIAGNOSIS — F411 Generalized anxiety disorder: Secondary | ICD-10-CM

## 2012-11-04 ENCOUNTER — Ambulatory Visit (INDEPENDENT_AMBULATORY_CARE_PROVIDER_SITE_OTHER): Payer: BC Managed Care – PPO | Admitting: Psychology

## 2012-11-04 DIAGNOSIS — F411 Generalized anxiety disorder: Secondary | ICD-10-CM

## 2012-11-04 DIAGNOSIS — F332 Major depressive disorder, recurrent severe without psychotic features: Secondary | ICD-10-CM

## 2012-11-10 NOTE — Progress Notes (Signed)
Patient:  Loretta Schaefer   DOB: 1971/02/03  MR Number: [id number]  Location: [facility] [address] [place] [address] Dept: [phone]  Start: 4 PM End: 5 PM  Provider/Observer:     Edgardo Roys PSYD  Chief Complaint:      Chief Complaint  Patient presents with  . Anxiety  . Depression    Reason For Service:   The patient was referred because of persistent and increasing symptoms of depression. The patient reports that the first symptoms she experienced a depression happened after her sister died in 2004/06/10. She reports that her mother then passed away in 06/10/2008 after the patient had taking care of her mother for the prior 2 years. She reports that her depression has worsened after this time. She was treated initially after her sister died for depression and she got better but then her depression again worsened. Her primary care physician referred her to our office.   Interventions Strategy:  Cognitive/behavioral psychotherapeutic interventions  Participation Level:   Active  Participation Quality:  Appropriate      Behavioral Observation:  Well Groomed, Alert, and Appropriate.   Current Psychosocial Factors: The patient reports that she has been doing much better recently and is coping better with the situation between her daughter/son-in-law and she and her husband. She reports that things have improved with her significantly.  Content of Session:   Review current symptoms and continue to work on therapeutic interventions are in issues of recurrent depression and anxiety. We have also been working on sleep hygiene issues as well.  Current Status:   The patient reports that her symptoms of anxiety and depressive been little bit better of the past several weeks..  Patient Progress:   Overall the patient has been progressing well and continuing to meet target goals.  Target  Goals:   Target goals are to decrease the overall intensity, frequency, and duration of depressive events and improved issues related to her anxiety and intrusive thoughts.  Last Reviewed:   11/08/2012   Addressed Today:    Today we worked on the cognitive skills of dealing with intrusive thoughts and anxiety as well as sleep hygiene issues.  Impression/Diagnosis:   The patient denies a long-standing history of mood swings are consistent and persistent episodes of of depression. She doesn't now is that she has become depressed both in Jun 10, 2004 and then again with the death of her mother in 2008/06/10.   Diagnosis:    Axis I:  Major depressive disorder, recurrent episode, severe, without mention of psychotic behavior  Anxiety state, unspecified      Axis II: No diagnosis

## 2012-11-16 ENCOUNTER — Encounter (HOSPITAL_COMMUNITY): Payer: Self-pay | Admitting: Psychology

## 2012-11-16 NOTE — Progress Notes (Signed)
Patient:  Loretta Schaefer   DOB: [birth year]  MR Number: [id number]  Location: [facility] [address] [place] [address] Dept: [phone]  Start: 4 PM End:  5 PM  Provider/Observer:     Edgardo Roys PSYD  Chief Complaint:      Chief Complaint  Patient presents with  . Anxiety  . Depression    Reason For Service:   The patient was referred because of persistent and increasing symptoms of depression. The patient reports that the first symptoms she experienced a depression happened after her sister died in 06/03/04. She reports that her mother then passed away in June 03, 2008 after the patient had taking care of her mother for the prior 2 years. She reports that her depression has worsened after this time. She was treated initially after her sister died for depression and she got better but then her depression again worsened. Her primary care physician referred her to our office.   Interventions Strategy:  Cognitive/behavioral psychotherapeutic interventions  Participation Level:   Active  Participation Quality:  Appropriate      Behavioral Observation:  Well Groomed, Alert, and Appropriate.   Current Psychosocial Factors: The patient reports that she has been doing somewhat better but a stressor between her and her daughter has developed. The patient and her husband had planned on taking a vacation for the first time with just is to and her son-in-law began acting like the patient had expected him to do been upset and wanting to be invited for their vacation.  Content of Session:   Review current symptoms and continue to work on therapeutic interventions are in issues of recurrent depression and anxiety. We have also been working on sleep hygiene issues as well.  Current Status:   The patient reports that her symptoms of anxiety and depressive been little bit better of the past several  weeks..  Patient Progress:   Overall the patient has been progressing well and continuing to meet target goals.  Target Goals:   Target goals are to decrease the overall intensity, frequency, and duration of depressive events and improved issues related to her anxiety and intrusive thoughts.  Last Reviewed:   10/06/2012   Addressed Today:    Today we worked on the cognitive skills of dealing with intrusive thoughts and anxiety as well as sleep hygiene issues.  Impression/Diagnosis:   The patient denies a long-standing history of mood swings are consistent and persistent episodes of of depression. She doesn't now is that she has become depressed both in 06-03-04 and then again with the death of her mother in 06-03-2008.   Diagnosis:    Axis I:  Major depressive disorder, recurrent episode, severe, without mention of psychotic behavior  Anxiety state, unspecified      Axis II: No diagnosis

## 2013-01-03 ENCOUNTER — Other Ambulatory Visit: Payer: Self-pay | Admitting: Obstetrics and Gynecology

## 2013-01-03 DIAGNOSIS — Z139 Encounter for screening, unspecified: Secondary | ICD-10-CM

## 2013-01-06 ENCOUNTER — Ambulatory Visit (INDEPENDENT_AMBULATORY_CARE_PROVIDER_SITE_OTHER): Payer: BC Managed Care – PPO | Admitting: Psychology

## 2013-01-06 DIAGNOSIS — F411 Generalized anxiety disorder: Secondary | ICD-10-CM

## 2013-01-06 DIAGNOSIS — F332 Major depressive disorder, recurrent severe without psychotic features: Secondary | ICD-10-CM

## 2013-02-02 ENCOUNTER — Other Ambulatory Visit: Payer: Self-pay

## 2013-02-06 ENCOUNTER — Ambulatory Visit (HOSPITAL_COMMUNITY): Payer: Self-pay | Admitting: Psychology

## 2013-02-14 ENCOUNTER — Ambulatory Visit (HOSPITAL_COMMUNITY): Payer: Self-pay | Admitting: Psychology

## 2013-02-17 ENCOUNTER — Encounter (HOSPITAL_COMMUNITY): Payer: Self-pay | Admitting: Psychology

## 2013-02-17 NOTE — Progress Notes (Signed)
Patient:  Loretta Schaefer   DOB: [birth year]  MR Number: [id number]  Location: [facility] [address] [place] [address] Dept: [phone]  Start: 4 PM End: 5 PM  Provider/Observer:     Edgardo Roys PSYD  Chief Complaint:      Chief Complaint  Patient presents with  . Anxiety  . Depression    Reason For Service:   The patient was referred because of persistent and increasing symptoms of depression. The patient reports that the first symptoms she experienced a depression happened after her sister died in 05-29-2004. She reports that her mother then passed away in 2008-05-29 after the patient had taking care of her mother for the prior 2 years. She reports that her depression has worsened after this time. She was treated initially after her sister died for depression and she got better but then her depression again worsened. Her primary care physician referred her to our office.   Interventions Strategy:  Cognitive/behavioral psychotherapeutic interventions  Participation Level:   Active  Participation Quality:  Appropriate      Behavioral Observation:  Well Groomed, Alert, and Appropriate.   Current Psychosocial Factors: T the patient reports that she and her husband were able to get away on vacation and she was able to manage the situation quite well with regard to expected responses by her daughter and son-in-law  Content of Session:   Review current symptoms and continue to work on therapeutic interventions are in issues of recurrent depression and anxiety. We have also been working on sleep hygiene issues as well.  Current Status:   The patient reports that her symptoms of anxiety and depressive been little bit better of the past several weeks..  Patient Progress:   Overall the patient has been progressing well and continuing to meet target goals.  Target Goals:   Target goals are to decrease  the overall intensity, frequency, and duration of depressive events and improved issues related to her anxiety and intrusive thoughts.  Last Reviewed:   01/06/2013   Addressed Today:    Today we worked on the cognitive skills of dealing with intrusive thoughts and anxiety as well as sleep hygiene issues.  Impression/Diagnosis:   The patient denies a long-standing history of mood swings are consistent and persistent episodes of of depression. She doesn't now is that she has become depressed both in 2004/05/29 and then again with the death of her mother in 05/29/2008.   Diagnosis:    Axis I:  Major depressive disorder, recurrent episode, severe, without mention of psychotic behavior  Anxiety state, unspecified      Axis II: No diagnosis

## 2013-02-20 ENCOUNTER — Other Ambulatory Visit: Payer: Self-pay

## 2013-02-20 ENCOUNTER — Ambulatory Visit (HOSPITAL_COMMUNITY)
Admission: RE | Admit: 2013-02-20 | Discharge: 2013-02-20 | Disposition: A | Discharge status: Home / Self Care | Payer: BC Managed Care – PPO | Source: Ambulatory Visit | Attending: Obstetrics and Gynecology | Admitting: Obstetrics and Gynecology

## 2013-02-20 DIAGNOSIS — Z1231 Encounter for screening mammogram for malignant neoplasm of breast: Secondary | ICD-10-CM | POA: Insufficient documentation

## 2013-02-20 DIAGNOSIS — K7689 Other specified diseases of liver: Secondary | ICD-10-CM

## 2013-02-20 DIAGNOSIS — Z139 Encounter for screening, unspecified: Secondary | ICD-10-CM

## 2013-02-24 LAB — HEPATIC FUNCTION PANEL
ALT: 13 U/L (ref 0–35)
AST: 15 U/L (ref 0–37)
Bilirubin, Direct: 0.1 mg/dL (ref 0.0–0.3)
Indirect Bilirubin: 0.4 mg/dL (ref 0.0–0.9)

## 2013-02-28 ENCOUNTER — Other Ambulatory Visit: Payer: Self-pay

## 2013-02-28 MED ORDER — PANTOPRAZOLE SODIUM 40 MG PO TBEC: 40.0000 mg | DELAYED_RELEASE_TABLET | Freq: Every day | ORAL | Status: DC

## 2013-03-01 ENCOUNTER — Other Ambulatory Visit: Payer: Self-pay | Admitting: Internal Medicine

## 2013-03-01 DIAGNOSIS — K7689 Other specified diseases of liver: Secondary | ICD-10-CM

## 2013-03-13 ENCOUNTER — Ambulatory Visit (HOSPITAL_COMMUNITY): Payer: Self-pay | Admitting: Psychology

## 2013-03-14 ENCOUNTER — Ambulatory Visit (HOSPITAL_COMMUNITY): Payer: Self-pay | Admitting: Psychology

## 2013-03-21 ENCOUNTER — Ambulatory Visit (INDEPENDENT_AMBULATORY_CARE_PROVIDER_SITE_OTHER): Payer: BC Managed Care – PPO | Admitting: Psychology

## 2013-03-21 ENCOUNTER — Encounter (HOSPITAL_COMMUNITY): Payer: Self-pay | Admitting: Psychology

## 2013-03-21 DIAGNOSIS — F411 Generalized anxiety disorder: Secondary | ICD-10-CM

## 2013-03-21 DIAGNOSIS — F332 Major depressive disorder, recurrent severe without psychotic features: Secondary | ICD-10-CM

## 2013-03-21 NOTE — Progress Notes (Signed)
Patient:  Loretta Schaefer   DOB: Sep 30, 1970  MR Number: [id number]  Location: [facility] [address] [place] [address] Dept: [phone]  Start: 10 AM End: 11 AM  Provider/Observer:     Edgardo Roys PSYD  Chief Complaint:      Chief Complaint  Patient presents with  . Stress  . Anxiety    Reason For Service:   The patient was referred because of persistent and increasing symptoms of depression. The patient reports that the first symptoms she experienced a depression happened after her sister died in 2004/06/22. She reports that her mother then passed away in June 22, 2008 after the patient had taking care of her mother for the prior 2 years. She reports that her depression has worsened after this time. She was treated initially after her sister died for depression and she got better but then her depression again worsened. Her primary care physician referred her to our office.   Interventions Strategy:  Cognitive/behavioral psychotherapeutic interventions  Participation Level:   Active  Participation Quality:  Appropriate      Behavioral Observation:  Well Groomed, Alert, and Appropriate.   Current Psychosocial Factors: The patient reports that she has struggled some coming up with the holidays dealing with the memories of her mother and her mother passing. The patient reports that this is led to some negative interactions with her sister [name]. The patient reports that there also a lot of stressors going on with her husband's family and their relationship but it does appear that her and her husband are on the same page and doing well in this regard.  Content of Session:   Review current symptoms and continue to work on therapeutic interventions are in issues of recurrent depression and anxiety. We have also been working on sleep hygiene issues as well.  Current Status:   The patient reports that  while she continues to have symptoms of depression anxiety have continued to get better and she is coping and managing with life stressors much better.  Patient Progress:   Overall the patient has been progressing well and continuing to meet target goals.  Target Goals:   Target goals are to decrease the overall intensity, frequency, and duration of depressive events and improved issues related to her anxiety and intrusive thoughts.  Last Reviewed:   03/21/2013   Addressed Today:    Today we worked on the cognitive skills of dealing with intrusive thoughts and anxiety as well as sleep hygiene issues.  Impression/Diagnosis:   The patient denies a long-standing history of mood swings are consistent and persistent episodes of of depression. She doesn't now is that she has become depressed both in 06-22-2004 and then again with the death of her mother in 06-22-08.   Diagnosis:    Axis I:  Major depressive disorder, recurrent episode, severe, without mention of psychotic behavior  Anxiety state, unspecified      Axis II: No diagnosis

## 2013-04-24 ENCOUNTER — Ambulatory Visit (INDEPENDENT_AMBULATORY_CARE_PROVIDER_SITE_OTHER): Payer: BC Managed Care – PPO | Admitting: Psychology

## 2013-04-24 DIAGNOSIS — F332 Major depressive disorder, recurrent severe without psychotic features: Secondary | ICD-10-CM

## 2013-04-24 DIAGNOSIS — F411 Generalized anxiety disorder: Secondary | ICD-10-CM

## 2013-05-10 ENCOUNTER — Ambulatory Visit (HOSPITAL_COMMUNITY): Payer: Self-pay | Admitting: Psychology

## 2013-05-17 ENCOUNTER — Encounter (HOSPITAL_COMMUNITY): Payer: Self-pay | Admitting: Psychology

## 2013-05-17 NOTE — Progress Notes (Signed)
Patient:  Loretta Schaefer   DOB: [birth year]  MR Number: [id number]  Location: [facility] [address] [place] [address] Dept: [phone]  Start: 4 PM End: 5 PM  Provider/Observer:     Edgardo Roys PSYD  Chief Complaint:      Chief Complaint  Patient presents with  . Anxiety  . Depression  . Stress    Reason For Service:   The patient was referred because of persistent and increasing symptoms of depression. The patient reports that the first symptoms she experienced a depression happened after her sister died in 11-Jun-2004. She reports that her mother then passed away in 06-11-08 after the patient had taking care of her mother for the prior 2 years. She reports that her depression has worsened after this time. She was treated initially after her sister died for depression and she got better but then her depression again worsened. Her primary care physician referred her to our office.   Interventions Strategy:  Cognitive/behavioral psychotherapeutic interventions  Participation Level:   Active  Participation Quality:  Appropriate      Behavioral Observation:  Well Groomed, Alert, and Appropriate.   Current Psychosocial Factors: The patient reports that she has struggled some coming up with the holidays dealing with the memories of her mother and her mother passing. The patient reports that this is led to some negative interactions with her sister [name]. The patient reports that there also a lot of stressors going on with her husband's family and their relationship but it does appear that her and her husband are on the same page and doing well in this regard.  Content of Session:   Review current symptoms and continue to work on therapeutic interventions are in issues of recurrent depression and anxiety. We have also been working on sleep hygiene issues as well.  Current Status:   The patient  reports that while she continues to have symptoms of depression anxiety have continued to get better and she is coping and managing with life stressors much better.  Patient Progress:   Overall the patient has been progressing well and continuing to meet target goals.  Target Goals:   Target goals are to decrease the overall intensity, frequency, and duration of depressive events and improved issues related to her anxiety and intrusive thoughts.  Last Reviewed:   04/24/2013   Addressed Today:    Today we worked on the cognitive skills of dealing with intrusive thoughts and anxiety as well as sleep hygiene issues.  Impression/Diagnosis:   The patient denies a long-standing history of mood swings are consistent and persistent episodes of of depression. She doesn't now is that she has become depressed both in 06/11/04 and then again with the death of her mother in 2008-06-11.   Diagnosis:    Axis I:  Major depressive disorder, recurrent episode, severe, without mention of psychotic behavior  Anxiety state, unspecified      Axis II: No diagnosis

## 2013-06-01 ENCOUNTER — Ambulatory Visit (INDEPENDENT_AMBULATORY_CARE_PROVIDER_SITE_OTHER): Payer: BC Managed Care – PPO | Admitting: Psychology

## 2013-06-01 DIAGNOSIS — F332 Major depressive disorder, recurrent severe without psychotic features: Secondary | ICD-10-CM

## 2013-06-01 DIAGNOSIS — F411 Generalized anxiety disorder: Secondary | ICD-10-CM

## 2013-06-05 ENCOUNTER — Ambulatory Visit (HOSPITAL_COMMUNITY): Payer: Self-pay | Admitting: Psychology

## 2013-06-27 ENCOUNTER — Encounter (HOSPITAL_COMMUNITY): Payer: Self-pay | Admitting: Psychology

## 2013-06-27 NOTE — Progress Notes (Signed)
  Patient:  Loretta Schaefer   DOB: [birth year]  MR Number: [id number]  Location: [facility] [address] [place] [address] Dept: [phone]  Start: 4 PM End: 5 PM  Provider/Observer:     Edgardo Roys PSYD  Chief Complaint:      Chief Complaint  Patient presents with  . Depression  . Stress    Reason For Service:   The patient was referred because of persistent and increasing symptoms of depression. The patient reports that the first symptoms she experienced a depression happened after her sister died in 06-07-2004. She reports that her mother then passed away in 06-07-2008 after the patient had taking care of her mother for the prior 2 years. She reports that her depression has worsened after this time. She was treated initially after her sister died for depression and she got better but then her depression again worsened. Her primary care physician referred her to our office.   Interventions Strategy:  Cognitive/behavioral psychotherapeutic interventions  Participation Level:   Active  Participation Quality:  Appropriate      Behavioral Observation:  Well Groomed, Alert, and Appropriate.   Current Psychosocial Factors: The patient reports that she has been doing better recently and has been a major conflicts within the family. She reports that she is doing much better job interacting with her sister and keeping her sister's manipulative behaviors at day.  Content of Session:   Review current symptoms and continue to work on therapeutic interventions are in issues of recurrent depression and anxiety. We have also been working on sleep hygiene issues as well.  Current Status:   The patient reports that while she continues to have symptoms of depression anxiety have continued to get better and she is coping and managing with life stressors much better.  Patient Progress:   Overall the patient has  been progressing well and continuing to meet target goals.  Target Goals:   Target goals are to decrease the overall intensity, frequency, and duration of depressive events and improved issues related to her anxiety and intrusive thoughts.  Last Reviewed:   06/01/2013   Addressed Today:    Today we worked on the cognitive skills of dealing with intrusive thoughts and anxiety as well as sleep hygiene issues.  Impression/Diagnosis:   The patient denies a long-standing history of mood swings are consistent and persistent episodes of of depression. She doesn't now is that she has become depressed both in Jun 07, 2004 and then again with the death of her mother in 07-Jun-2008.   Diagnosis:    Axis I:  Major depressive disorder, recurrent episode, severe, without mention of psychotic behavior  Anxiety state, unspecified      Axis II: No diagnosis

## 2013-07-10 ENCOUNTER — Ambulatory Visit (INDEPENDENT_AMBULATORY_CARE_PROVIDER_SITE_OTHER): Payer: BC Managed Care – PPO | Admitting: Psychology

## 2013-07-10 DIAGNOSIS — F411 Generalized anxiety disorder: Secondary | ICD-10-CM

## 2013-07-10 DIAGNOSIS — F332 Major depressive disorder, recurrent severe without psychotic features: Secondary | ICD-10-CM

## 2013-07-11 ENCOUNTER — Encounter (HOSPITAL_COMMUNITY): Payer: Self-pay | Admitting: Psychology

## 2013-07-11 NOTE — Progress Notes (Signed)
   PROGRESS NOTE   Patient:  Loretta Schaefer   DOB: Mar 24, 1971  MR Number: [id number]  Location: [facility] [address] [place] [address] Dept: [phone]  Start: 4 PM End: 5 PM  Provider/Observer:     Edgardo Roys PSYD  Chief Complaint:      Chief Complaint  Patient presents with  . Depression  . Anxiety  . Stress    Reason For Service:   The patient was referred because of persistent and increasing symptoms of depression. The patient reports that the first symptoms she experienced a depression happened after her sister died in 06-22-2004. She reports that her mother then passed away in 2008/06/22 after the patient had taking care of her mother for the prior 2 years. She reports that her depression has worsened after this time. She was treated initially after her sister died for depression and she got better but then her depression again worsened. Her primary care physician referred her to our office.   Interventions Strategy:  Cognitive/behavioral psychotherapeutic interventions  Participation Level:   Active  Participation Quality:  Appropriate      Behavioral Observation:  Well Groomed, Alert, and Appropriate.   Current Psychosocial Factors: The patient reports that she has been through a lot since I last saw her.  Her son got sick and went to ED.  Difficulty with throat.  When they scoped him found a hole in his esopagus.  Feeding tube attempted to be placed and he ended up getting septic and was in ICU with surgery for two weeks and was very sick with risk of death.  The patient reports that she had to help cope with his fears and not really deal with her own fears.  Content of Session:   Review current symptoms and continue to work on therapeutic interventions are in issues of recurrent depression and anxiety. We have also been working on sleep hygiene issues as well.  Current  Status:   The patient reports that while she continues to have symptoms of depression anxiety but the stressfull events of the past two weeks indicate that she has really improved a great deal in her coping skills  Patient Progress:   Overall the patient has been progressing well and continuing to meet target goals.  Target Goals:   Target goals are to decrease the overall intensity, frequency, and duration of depressive events and improved issues related to her anxiety and intrusive thoughts.  Last Reviewed:   07/10/2013   Addressed Today:    Today we worked on the cognitive skills of dealing with intrusive thoughts and anxiety as well as sleep hygiene issues.  Impression/Diagnosis:   The patient denies a long-standing history of mood swings are consistent and persistent episodes of of depression. She doesn't now is that she has become depressed both in 06/22/04 and then again with the death of her mother in June 22, 2008.   Diagnosis:    Axis I:  Major depressive disorder, recurrent episode, severe, without mention of psychotic behavior  Anxiety state, unspecified      Axis II: No diagnosis                 Catori Panozzo R, PsyD 07/11/2013

## 2013-08-09 ENCOUNTER — Ambulatory Visit (INDEPENDENT_AMBULATORY_CARE_PROVIDER_SITE_OTHER): Payer: BC Managed Care – PPO | Admitting: Psychology

## 2013-08-09 DIAGNOSIS — F332 Major depressive disorder, recurrent severe without psychotic features: Secondary | ICD-10-CM

## 2013-08-09 DIAGNOSIS — F411 Generalized anxiety disorder: Secondary | ICD-10-CM

## 2013-08-18 ENCOUNTER — Encounter (HOSPITAL_COMMUNITY): Payer: Self-pay | Admitting: Psychology

## 2013-08-18 NOTE — Progress Notes (Signed)
   PROGRESS NOTE   Patient:  Loretta Schaefer   DOB: 08/31/1970  MR Number: [id number]  Location: [facility] [address] [place] [address] Dept: [phone]  Start: 4 PM End: 5 PM  Provider/Observer:     Edgardo Roys PSYD  Chief Complaint:      Chief Complaint  Patient presents with  . Anxiety  . Depression  . Stress    Reason For Service:   The patient was referred because of persistent and increasing symptoms of depression. The patient reports that the first symptoms she experienced a depression happened after her sister died in 06/04/2004. She reports that her mother then passed away in June 04, 2008 after the patient had taking care of her mother for the prior 2 years. She reports that her depression has worsened after this time. She was treated initially after her sister died for depression and she got better but then her depression again worsened. Her primary care physician referred her to our office.   Interventions Strategy:  Cognitive/behavioral psychotherapeutic interventions  Participation Level:   Active  Participation Quality:  Appropriate      Behavioral Observation:  Well Groomed, Alert, and Appropriate.   Current Psychosocial Factors: The patient reports that her son has been recovering quite well after his severe and frightening medical complications and hospitalization. The patient reports that he has continued to be stressful for her as he has avoided or resisted followup medical care for some of the things that they need to do. The patient reports that her depression has improved significantly as her stress is reduced..  Content of Session:   Review current symptoms and continue to work on therapeutic interventions are in issues of recurrent depression and anxiety. We have also been working on sleep hygiene issues as well.  Current Status:   The patient reports as her son's  medical status has improved she has been experiencing a little bit less anxiety and is coping better.  Patient Progress:   Overall the patient has been progressing well and continuing to meet target goals.  Target Goals:   Target goals are to decrease the overall intensity, frequency, and duration of depressive events and improved issues related to her anxiety and intrusive thoughts.  Last Reviewed:   07/10/2013   Addressed Today:    Today we worked on the cognitive skills of dealing with intrusive thoughts and anxiety as well as sleep hygiene issues.  Impression/Diagnosis:   The patient denies a long-standing history of mood swings are consistent and persistent episodes of of depression. She doesn't now is that she has become depressed both in 06-04-04 and then again with the death of her mother in 06-04-2008.   Diagnosis:    Axis I:  Major depressive disorder, recurrent episode, severe, without mention of psychotic behavior  Anxiety state, unspecified      Axis II: No diagnosis                 RODENBOUGH,JOHN R, PsyD 08/18/2013

## 2013-09-06 ENCOUNTER — Ambulatory Visit (INDEPENDENT_AMBULATORY_CARE_PROVIDER_SITE_OTHER): Payer: BC Managed Care – PPO | Admitting: Psychology

## 2013-09-06 DIAGNOSIS — F332 Major depressive disorder, recurrent severe without psychotic features: Secondary | ICD-10-CM

## 2013-09-06 DIAGNOSIS — F411 Generalized anxiety disorder: Secondary | ICD-10-CM

## 2013-09-07 ENCOUNTER — Encounter (HOSPITAL_COMMUNITY): Payer: Self-pay | Admitting: Psychology

## 2013-09-07 NOTE — Progress Notes (Signed)
   PROGRESS NOTE   Patient:  Loretta Schaefer   DOB: 1970/08/20  MR Number: [id number]  Location: [facility] [address] [place] [address] Dept: [phone]  Start: 4 PM End: 5 PM  Provider/Observer:     Edgardo Roys PSYD  Chief Complaint:      Chief Complaint  Patient presents with  . Depression  . Anxiety    Reason For Service:   The patient was referred because of persistent and increasing symptoms of depression. The patient reports that the first symptoms she experienced a depression happened after her sister died in 2004/05/27. She reports that her mother then passed away in 27-May-2008 after the patient had taking care of her mother for the prior 2 years. She reports that her depression has worsened after this time. She was treated initially after her sister died for depression and she got better but then her depression again worsened. Her primary care physician referred her to our office.   Interventions Strategy:  Cognitive/behavioral psychotherapeutic interventions  Participation Level:   Active  Participation Quality:  Appropriate      Behavioral Observation:  Well Groomed, Alert, and Appropriate.   Current Psychosocial Factors: The patient reports that her son is doing much better from a medical standpoint and this has helped her fear and worry a great deal. The patient reports that she has been recovering from her surgery on her big toe that is going to take a couple of months to finish. She is still wearing a boot which has limited her movement.  Content of Session:   Review current symptoms and continue to work on therapeutic interventions are in issues of recurrent depression and anxiety. We have also been working on sleep hygiene issues as well.  Current Status:   The patient reports as her son's medical status has improved she has been experiencing a little bit less anxiety and  is coping better.  Patient Progress:   Overall the patient has been progressing well and continuing to meet target goals.  Target Goals:   Target goals are to decrease the overall intensity, frequency, and duration of depressive events and improved issues related to her anxiety and intrusive thoughts.  Last Reviewed:   09/06/2013   Addressed Today:    Today we worked on the cognitive skills of dealing with intrusive thoughts and anxiety as well as sleep hygiene issues.  Impression/Diagnosis:   The patient denies a long-standing history of mood swings are consistent and persistent episodes of of depression. She doesn't now is that she has become depressed both in May 27, 2004 and then again with the death of her mother in May 27, 2008.   Diagnosis:    Axis I:  Major depressive disorder, recurrent episode, severe, without mention of psychotic behavior  Anxiety state, unspecified      Axis II: No diagnosis     Enmanuel Zufall R, PsyD 09/07/2013

## 2013-09-12 ENCOUNTER — Telehealth: Payer: Self-pay | Admitting: *Deleted

## 2013-09-12 MED ORDER — PANTOPRAZOLE SODIUM 40 MG PO TBEC
40.0000 mg | DELAYED_RELEASE_TABLET | Freq: Every day | ORAL | Status: DC
Start: 2013-09-12 — End: 2013-10-20

## 2013-09-12 NOTE — Telephone Encounter (Signed)
Routing to refill box

## 2013-09-12 NOTE — Telephone Encounter (Signed)
Needs OV before further refills. RF for 2 months provided.

## 2013-09-12 NOTE — Telephone Encounter (Signed)
Pt called stating she does not have anymore refills on her protonix. Please advise [phone]

## 2013-09-13 NOTE — Telephone Encounter (Signed)
Pt called back I made pt a appointment for 10/19/13 3:30 with Laban Emperor NP, pt is aware of this appointment.

## 2013-10-16 ENCOUNTER — Ambulatory Visit (INDEPENDENT_AMBULATORY_CARE_PROVIDER_SITE_OTHER): Payer: BC Managed Care – PPO | Admitting: Psychology

## 2013-10-16 DIAGNOSIS — F411 Generalized anxiety disorder: Secondary | ICD-10-CM

## 2013-10-16 DIAGNOSIS — F332 Major depressive disorder, recurrent severe without psychotic features: Secondary | ICD-10-CM

## 2013-10-19 ENCOUNTER — Ambulatory Visit: Payer: Self-pay | Admitting: Gastroenterology

## 2013-10-20 ENCOUNTER — Ambulatory Visit (INDEPENDENT_AMBULATORY_CARE_PROVIDER_SITE_OTHER): Payer: BC Managed Care – PPO | Admitting: Gastroenterology

## 2013-10-20 ENCOUNTER — Encounter: Payer: Self-pay | Admitting: Gastroenterology

## 2013-10-20 VITALS — BP 123/83 | HR 69 | Temp 98.1°F | Ht 61.0 in | Wt 161.6 lb

## 2013-10-20 DIAGNOSIS — K219 Gastro-esophageal reflux disease without esophagitis: Secondary | ICD-10-CM

## 2013-10-20 DIAGNOSIS — K7689 Other specified diseases of liver: Secondary | ICD-10-CM

## 2013-10-20 LAB — HEPATIC FUNCTION PANEL
ALK PHOS: 43 U/L (ref 39–117)
ALT: 15 U/L (ref 0–35)
AST: 16 U/L (ref 0–37)
Albumin: 4.3 g/dL (ref 3.5–5.2)
BILIRUBIN DIRECT: 0.1 mg/dL (ref 0.0–0.3)
BILIRUBIN TOTAL: 0.7 mg/dL (ref 0.2–1.2)
Indirect Bilirubin: 0.6 mg/dL (ref 0.2–1.2)
TOTAL PROTEIN: 7 g/dL (ref 6.0–8.3)

## 2013-10-20 MED ORDER — PANTOPRAZOLE SODIUM 40 MG PO TBEC: 40.0000 mg | DELAYED_RELEASE_TABLET | Freq: Every day | ORAL | Status: DC

## 2013-10-20 NOTE — Progress Notes (Signed)
      Primary Care Physician: Glenda Chroman., MD  Primary Gastroenterologist:  Garfield Cornea, MD   Chief Complaint  Patient presents with  . Follow-up    doing ok needs refill    HPI: Loretta Schaefer is a 43 y.o. female here for f/u biopsy proven NASH (without cirrhosis in 2010), GERD. Last seen in 08/2011. Mother and Aunt both had NASH cirrhosis. Patient did not take Actigall after few doses due to abdominal cramping. Weighed 172 in 08/2011, 161 today. Had gotten down to 150 but gained some back after foot surgery. Feels well. Heartburn under controlled. No dysphagia, constipation, diarrhea, melena, brbpr, abdominal pain.   Current Outpatient Prescriptions  Medication Sig Dispense Refill  . Ibuprofen (MOTRIN PO) Take 200 mg by mouth as needed.       . pantoprazole (PROTONIX) 40 MG tablet Take 1 tablet (40 mg total) by mouth daily.  30 tablet  1  . Red Yeast Rice 600 MG CAPS Take by mouth daily.      . vitamin E 400 UNIT capsule Take 400 Units by mouth daily.       No current facility-administered medications for this visit.    Allergies as of 10/20/2013 - Review Complete 10/20/2013  Allergen Reaction Noted  . Omnipaque [iohexol] Hives and Itching 06/02/2010   Past Surgical History  Procedure Laterality Date  . Cesarean section      x 2  . Panniculectomy  2005  . S/p hysterectomy  2005  . Esophagogastroduodenoscopy  05/2010    distal ERE, small hh  . Colonoscopy  05/2010    normal TI, hyperplastic rectal polyp, friable anal canal    ROS:  General: Negative for anorexia, weight loss, fever, chills, fatigue, weakness. ENT: Negative for hoarseness, difficulty swallowing , nasal congestion. CV: Negative for chest pain, angina, palpitations, dyspnea on exertion, peripheral edema.  Respiratory: Negative for dyspnea at rest, dyspnea on exertion, cough, sputum, wheezing.  GI: See history of present illness. GU:  Negative for dysuria, hematuria, urinary incontinence, urinary  frequency, nocturnal urination.  Endo: Negative for unusual weight change.    Physical Examination:   BP 123/83  Pulse 69  Temp(Src) 98.1 F (36.7 C) (Oral)  Ht 5' 1"  (1.549 m)  Wt 161 lb 9.6 oz (73.301 kg)  BMI 30.55 kg/m2  General: Well-nourished, well-developed in no acute distress.  Eyes: No icterus. Mouth: Oropharyngeal mucosa moist and pink , no lesions erythema or exudate. Lungs: Clear to auscultation bilaterally.  Heart: Regular rate and rhythm, no murmurs rubs or gallops.  Abdomen: Bowel sounds are normal, nontender, nondistended, no hepatosplenomegaly or masses, no abdominal bruits or hernia , no rebound or guarding.   Extremities: No lower extremity edema. No clubbing or deformities. Neuro: Alert and oriented x 4   Skin: Warm and dry, no jaundice.   Psych: Alert and cooperative, normal mood and affect.  Labs:  Lab Results  Component Value Date   ALT 13 02/24/2013   AST 15 02/24/2013   ALKPHOS 41 02/24/2013   BILITOT 0.5 02/24/2013    Imaging Studies: No results found.

## 2013-10-20 NOTE — Patient Instructions (Signed)
1. Please get your labs and ultrasound done. We will call you with results.  2. Return in 18 months.

## 2013-10-20 NOTE — Assessment & Plan Note (Signed)
Doing well on pantoprazole. Ov in 18 months.

## 2013-10-20 NOTE — Assessment & Plan Note (Signed)
Due LFTs. U/S with elastography for baseline, look for progression since liver bx 2000.  Instructions for fatty liver: Recommend 1-2# weight loss per week until ideal body weight through exercise & diet. Low fat/cholesterol diet.   Avoid sweets, sodas, fruit juices, sweetened beverages like tea, etc. Gradually increase exercise from 15 min daily up to 1 hr per day 5 days/week. Limit alcohol use.  OV in 18 months.

## 2013-10-23 NOTE — Progress Notes (Signed)
Quick Note:  Please let patient know her LFTs were normal. Await u/s. ______

## 2013-10-24 NOTE — Progress Notes (Signed)
cc'd to pcp 

## 2013-10-26 ENCOUNTER — Ambulatory Visit (HOSPITAL_COMMUNITY)
Admission: RE | Admit: 2013-10-26 | Discharge: 2013-10-26 | Disposition: A | Discharge status: Home / Self Care | Payer: BC Managed Care – PPO | Source: Ambulatory Visit | Attending: Gastroenterology | Admitting: Gastroenterology

## 2013-10-26 DIAGNOSIS — K7689 Other specified diseases of liver: Secondary | ICD-10-CM

## 2013-10-26 DIAGNOSIS — K219 Gastro-esophageal reflux disease without esophagitis: Secondary | ICD-10-CM | POA: Insufficient documentation

## 2013-10-27 NOTE — Progress Notes (Signed)
Quick Note:  To discuss findings with Dr. Gala Romney. Relatively young patient, significant family history. Likely tertiary care referral. Clinically no signs of decompensated liver disease. ______

## 2013-11-01 ENCOUNTER — Encounter (HOSPITAL_COMMUNITY): Payer: Self-pay | Admitting: Pharmacy Technician

## 2013-11-01 ENCOUNTER — Other Ambulatory Visit: Payer: Self-pay | Admitting: Internal Medicine

## 2013-11-01 DIAGNOSIS — K7581 Nonalcoholic steatohepatitis (NASH): Secondary | ICD-10-CM

## 2013-11-01 NOTE — Progress Notes (Signed)
Quick Note:  Patient has called several times. Please see what Magda Paganini wrote. She does have a Metavir score of 4, which likely indicates cirrhosis. History of NASH, so this is possibly the culprit. I question if possibly a liver biopsy would be the best route, but it I am unsure what Dr. Gala Romney would think is best. Please let the patient know that we need to set up cirrhosis care, which includes vaccinations for Hep A and B if not already done, EGD for variceal screening, and return in 6 months. We can go ahead and get this started now. Will let Magda Paganini discuss with RMR. Please reassure the patient that she is well-compensated, and before I order anything additional, we need time to speak with the GI attending. ______

## 2013-11-03 ENCOUNTER — Telehealth: Payer: Self-pay

## 2013-11-03 NOTE — Telephone Encounter (Signed)
Pt is calling because she does not know why she is having an EGD done. Please advise

## 2013-11-07 NOTE — Telephone Encounter (Signed)
I spoke with patient. She is aware of reasoning behind EGD. I spoke with RMR about F4 score and upcoming EGD for esophageal screening. Patient with FH of NASH cirrhosis (mother and maternal aunt). Patient had liver bx 2000, steatohepatitis but no cirrhosis at that time. Did not tolerate urso.   I do not have access to any labs or serologies that may have been done remotely. She is going to hold off on vaccinations until we confirmed whether or not she is immune. She may need other labs after EGD as per RMR future instructions.

## 2013-11-07 NOTE — Progress Notes (Signed)
Quick Note:  Please see telephone note 11/03/13.  Patient had liver bx 2000, steatohepatitis at that time. Old chart in storage.  Discussed with RMR. Proceed with EGD. Decide next step for F4 score afterwards. Patient aware. I also requested her to hold off on vaccinations. She is not sure if she took hep B vaccine before. We will check immune status in upcoming future. Await egd. ______

## 2013-11-08 ENCOUNTER — Encounter (HOSPITAL_COMMUNITY): Payer: Self-pay | Admitting: Psychology

## 2013-11-08 NOTE — Progress Notes (Signed)
   PROGRESS NOTE   Patient:  Loretta Schaefer   DOB: December 31, 1970  MR Number: [id number]  Location: [facility] [address] [place] [address] Dept: [phone]  Start: 4 PM End: 5 PM  Provider/Observer:     Edgardo Roys PSYD  Chief Complaint:      Chief Complaint  Patient presents with  . Anxiety  . Depression    Reason For Service:   The patient was referred because of persistent and increasing symptoms of depression. The patient reports that the first symptoms she experienced a depression happened after her sister died in 2004-06-09. She reports that her mother then passed away in June 09, 2008 after the patient had taking care of her mother for the prior 2 years. She reports that her depression has worsened after this time. She was treated initially after her sister died for depression and she got better but then her depression again worsened. Her primary care physician referred her to our office.   Interventions Strategy:  Cognitive/behavioral psychotherapeutic interventions  Participation Level:   Active  Participation Quality:  Appropriate      Behavioral Observation:  Well Groomed, Alert, and Appropriate.   Current Psychosocial Factors: The patient reports her anxiety has reduced along with the improvement in her son. The patient reports that she is still frustrated that he is not able to follow through. However, with his improvement she has been able to focus on other aspects of her life and feels like she has been doing better recently..  Content of Session:   Review current symptoms and continue to work on therapeutic interventions are in issues of recurrent depression and anxiety. We have also been working on sleep hygiene issues as well.  Current Status:   The patient reports as her son's medical status has improved she has been experiencing a little bit less anxiety and is coping  better.  Patient Progress:   Overall the patient has been progressing well and continuing to meet target goals.  Target Goals:   Target goals are to decrease the overall intensity, frequency, and duration of depressive events and improved issues related to her anxiety and intrusive thoughts.  Last Reviewed:   10/16/2013   Addressed Today:    Today we worked on the cognitive skills of dealing with intrusive thoughts and anxiety as well as sleep hygiene issues.  Impression/Diagnosis:   The patient denies a long-standing history of mood swings are consistent and persistent episodes of of depression. She doesn't now is that she has become depressed both in 06-09-2004 and then again with the death of her mother in 06/09/2008.   Diagnosis:    Axis I:  Major depressive disorder, recurrent episode, severe, without mention of psychotic behavior  Anxiety state, unspecified      Axis II: No diagnosis     RODENBOUGH,JOHN R, PsyD 11/08/2013

## 2013-11-09 ENCOUNTER — Other Ambulatory Visit: Payer: Self-pay

## 2013-11-09 DIAGNOSIS — K7689 Other specified diseases of liver: Secondary | ICD-10-CM

## 2013-11-10 ENCOUNTER — Other Ambulatory Visit: Payer: Self-pay

## 2013-11-13 ENCOUNTER — Ambulatory Visit (HOSPITAL_COMMUNITY)
Admission: RE | Admit: 2013-11-13 | Discharge: 2013-11-13 | Disposition: A | Discharge status: Home / Self Care | Payer: BC Managed Care – PPO | Source: Ambulatory Visit | Attending: Internal Medicine | Admitting: Internal Medicine

## 2013-11-13 ENCOUNTER — Encounter (HOSPITAL_COMMUNITY): Payer: Self-pay | Admitting: *Deleted

## 2013-11-13 ENCOUNTER — Encounter (HOSPITAL_COMMUNITY)
Admission: RE | Disposition: A | Discharge status: Home / Self Care | Payer: Self-pay | Source: Ambulatory Visit | Attending: Internal Medicine

## 2013-11-13 DIAGNOSIS — K449 Diaphragmatic hernia without obstruction or gangrene: Secondary | ICD-10-CM | POA: Insufficient documentation

## 2013-11-13 DIAGNOSIS — K219 Gastro-esophageal reflux disease without esophagitis: Secondary | ICD-10-CM | POA: Diagnosis not present

## 2013-11-13 DIAGNOSIS — K7689 Other specified diseases of liver: Secondary | ICD-10-CM | POA: Insufficient documentation

## 2013-11-13 DIAGNOSIS — K7581 Nonalcoholic steatohepatitis (NASH): Secondary | ICD-10-CM

## 2013-11-13 HISTORY — PX: ESOPHAGOGASTRODUODENOSCOPY: SHX5428

## 2013-11-13 SURGERY — EGD (ESOPHAGOGASTRODUODENOSCOPY)
Anesthesia: Moderate Sedation

## 2013-11-13 MED ORDER — ONDANSETRON HCL 4 MG/2ML IJ SOLN
INTRAMUSCULAR | Status: DC | PRN
  Administered 2013-11-13: 4 mg via INTRAVENOUS

## 2013-11-13 MED ORDER — MIDAZOLAM HCL 5 MG/5ML IJ SOLN
INTRAMUSCULAR | Status: DC | PRN
  Administered 2013-11-13: 2 mg via INTRAVENOUS
  Administered 2013-11-13 (×2): 1 mg via INTRAVENOUS
  Administered 2013-11-13: 2 mg via INTRAVENOUS

## 2013-11-13 MED ORDER — ONDANSETRON HCL 4 MG/2ML IJ SOLN
INTRAMUSCULAR | Status: AC
  Filled 2013-11-13: qty 2

## 2013-11-13 MED ORDER — MEPERIDINE HCL 100 MG/ML IJ SOLN
INTRAMUSCULAR | Status: AC
  Filled 2013-11-13: qty 2

## 2013-11-13 MED ORDER — LIDOCAINE VISCOUS 2 % MT SOLN
OROMUCOSAL | Status: DC | PRN
  Administered 2013-11-13: 3 mL via OROMUCOSAL

## 2013-11-13 MED ORDER — MEPERIDINE HCL 100 MG/ML IJ SOLN
INTRAMUSCULAR | Status: DC | PRN
  Administered 2013-11-13 (×2): 25 mg via INTRAVENOUS
  Administered 2013-11-13: 50 mg via INTRAVENOUS

## 2013-11-13 MED ORDER — SODIUM CHLORIDE 0.9 % IV SOLN
INTRAVENOUS | Status: DC
  Administered 2013-11-13: 12:00:00 via INTRAVENOUS

## 2013-11-13 MED ORDER — LIDOCAINE VISCOUS 2 % MT SOLN
OROMUCOSAL | Status: DC
Start: 2013-11-13 — End: 2013-11-13
  Filled 2013-11-13: qty 15

## 2013-11-13 MED ORDER — MIDAZOLAM HCL 5 MG/5ML IJ SOLN
INTRAMUSCULAR | Status: AC
  Filled 2013-11-13: qty 10

## 2013-11-13 MED ORDER — PANTOPRAZOLE SODIUM 40 MG PO TBEC: 40.0000 mg | DELAYED_RELEASE_TABLET | Freq: Every day | ORAL | Status: DC

## 2013-11-13 MED ORDER — STERILE WATER FOR IRRIGATION IR SOLN
Status: DC | PRN
  Administered 2013-11-13: 13:00:00

## 2013-11-13 NOTE — Interval H&P Note (Signed)
History and Physical Interval Note:  11/13/2013 12:47 PM  [place]  has presented today for surgery, with the diagnosis of HISTORY OF NASH  The various methods of treatment have been discussed with the patient and family. After consideration of risks, benefits and other options for treatment, the patient has consented to  Procedure(s) with comments: ESOPHAGOGASTRODUODENOSCOPY (EGD) (N/A) - 2:15 as a surgical intervention .  The patient's history has been reviewed, patient examined, no change in status, stable for surgery.  I have reviewed the patient's chart and labs.  Questions were answered to the patient's satisfaction.     No change. EGD per plan.The risks, benefits, limitations, alternatives and imponderables have been reviewed with the patient. Potential for esophageal dilation, biopsy, etc. have also been reviewed.  Questions have been answered. All parties agreeable.  Manus Rudd

## 2013-11-13 NOTE — H&P (View-Only) (Signed)
      Primary Care Physician: Glenda Chroman., MD  Primary Gastroenterologist:  Garfield Cornea, MD   Chief Complaint  Patient presents with  . Follow-up    doing ok needs refill    HPI: Loretta Schaefer is a 43 y.o. female here for f/u biopsy proven NASH (without cirrhosis in 2010), GERD. Last seen in 08/2011. Mother and Aunt both had NASH cirrhosis. Patient did not take Actigall after few doses due to abdominal cramping. Weighed 172 in 08/2011, 161 today. Had gotten down to 150 but gained some back after foot surgery. Feels well. Heartburn under controlled. No dysphagia, constipation, diarrhea, melena, brbpr, abdominal pain.   Current Outpatient Prescriptions  Medication Sig Dispense Refill  . Ibuprofen (MOTRIN PO) Take 200 mg by mouth as needed.       . pantoprazole (PROTONIX) 40 MG tablet Take 1 tablet (40 mg total) by mouth daily.  30 tablet  1  . Red Yeast Rice 600 MG CAPS Take by mouth daily.      . vitamin E 400 UNIT capsule Take 400 Units by mouth daily.       No current facility-administered medications for this visit.    Allergies as of 10/20/2013 - Review Complete 10/20/2013  Allergen Reaction Noted  . Omnipaque [iohexol] Hives and Itching 06/02/2010   Past Surgical History  Procedure Laterality Date  . Cesarean section      x 2  . Panniculectomy  2005  . S/p hysterectomy  2005  . Esophagogastroduodenoscopy  05/2010    distal ERE, small hh  . Colonoscopy  05/2010    normal TI, hyperplastic rectal polyp, friable anal canal    ROS:  General: Negative for anorexia, weight loss, fever, chills, fatigue, weakness. ENT: Negative for hoarseness, difficulty swallowing , nasal congestion. CV: Negative for chest pain, angina, palpitations, dyspnea on exertion, peripheral edema.  Respiratory: Negative for dyspnea at rest, dyspnea on exertion, cough, sputum, wheezing.  GI: See history of present illness. GU:  Negative for dysuria, hematuria, urinary incontinence, urinary  frequency, nocturnal urination.  Endo: Negative for unusual weight change.    Physical Examination:   BP 123/83  Pulse 69  Temp(Src) 98.1 F (36.7 C) (Oral)  Ht 5' 1"  (1.549 m)  Wt 161 lb 9.6 oz (73.301 kg)  BMI 30.55 kg/m2  General: Well-nourished, well-developed in no acute distress.  Eyes: No icterus. Mouth: Oropharyngeal mucosa moist and pink , no lesions erythema or exudate. Lungs: Clear to auscultation bilaterally.  Heart: Regular rate and rhythm, no murmurs rubs or gallops.  Abdomen: Bowel sounds are normal, nontender, nondistended, no hepatosplenomegaly or masses, no abdominal bruits or hernia , no rebound or guarding.   Extremities: No lower extremity edema. No clubbing or deformities. Neuro: Alert and oriented x 4   Skin: Warm and dry, no jaundice.   Psych: Alert and cooperative, normal mood and affect.  Labs:  Lab Results  Component Value Date   ALT 13 02/24/2013   AST 15 02/24/2013   ALKPHOS 41 02/24/2013   BILITOT 0.5 02/24/2013    Imaging Studies: No results found.

## 2013-11-13 NOTE — Op Note (Signed)
[facility] [address] [place], [address]   ENDOSCOPY PROCEDURE REPORT  PATIENT: Loretta, Schaefer  MR#: [id number] BIRTHDATE: October 27, 1970 , 89  yrs. old GENDER: Female ENDOSCOPIST: R.  Garfield Cornea, MD FACP FACG REFERRED BY:  Jerene Bears, M.D. PROCEDURE DATE:  11/13/2013 PROCEDURE:     EGD-diagnostic/screen for varices  INDICATIONS:    history of NASH; Metavir of score of 4 recently  INFORMED CONSENT:   The risks, benefits, limitations, alternatives and imponderables have been discussed.  The potential for biopsy, esophogeal dilation, etc. have also been reviewed.  Questions have been answered.  All parties agreeable.  Please see the history and physical in the medical record for more information.  MEDICATIONS:   IV Versed and Demerol in incremental doses. Zofran 4 mg IV. Xylocaine gel orally  DESCRIPTION OF PROCEDURE:   The [id number] ([id number])  endoscope was introduced through the mouth and advanced to the second portion of the duodenum without difficulty or limitations.  The mucosal surfaces were surveyed very carefully during advancement of the scope and upon withdrawal.  Retroflexion view of the proximal stomach and esophagogastric junction was performed.      FINDINGS: Normal esophagus. No varices whatsoever. Stomach empty. Small hiatal hernia.-Because also appeared normal. Patent pylorus. Normal first and second portion of the duodenum  THERAPEUTIC / DIAGNOSTIC MANEUVERS PERFORMED:  none   COMPLICATIONS:  None  IMPRESSION:  Small hiatal hernia; otherwise, negative EGD. No stigmata of cirrhosis found.  Patient may  not have cirrhosis at this time.  RECOMMENDATIONS:  Aerobic exercise 30-45 minutes 3 times weekly. Strive to achieve an ideal body weight.  Continue drinking (2 to 3) 8 ounce cups of coffee daily as this is been shown to be protective against NASH.  Office visit with [place] in 3 months.    _______________________________ R.  Garfield Cornea, MD FACP [facility] eSigned:  R. Garfield Cornea, MD FACP [facility] 11/13/2013 1:30 PM     CC:

## 2013-11-13 NOTE — Discharge Instructions (Signed)
EGD Discharge instructions Please read the instructions outlined below and refer to this sheet in the next few weeks. These discharge instructions provide you with general information on caring for yourself after you leave the hospital. Your doctor may also give you specific instructions. While your treatment has been planned according to the most current medical practices available, unavoidable complications occasionally occur. If you have any problems or questions after discharge, please call your doctor. ACTIVITY  You may resume your regular activity but move at a slower pace for the next 24 hours.   Take frequent rest periods for the next 24 hours.   Walking will help expel (get rid of) the air and reduce the bloated feeling in your abdomen.   No driving for 24 hours (because of the anesthesia (medicine) used during the test).   You may shower.   Do not sign any important legal documents or operate any machinery for 24 hours (because of the anesthesia used during the test).  NUTRITION  Drink plenty of fluids.   You may resume your normal diet.   Begin with a light meal and progress to your normal diet.   Avoid alcoholic beverages for 24 hours or as instructed by your caregiver.  MEDICATIONS  You may resume your normal medications unless your caregiver tells you otherwise.  WHAT YOU CAN EXPECT TODAY  You may experience abdominal discomfort such as a feeling of fullness or gas pains.  FOLLOW-UP  Your doctor will discuss the results of your test with you.  SEEK IMMEDIATE MEDICAL ATTENTION IF ANY OF THE FOLLOWING OCCUR:  Excessive nausea (feeling sick to your stomach) and/or vomiting.   Severe abdominal pain and distention (swelling).   Trouble swallowing.   Temperature over 101 F (37.8 C).   Rectal bleeding or vomiting of blood.    Continue with regular exercises.   Continue weight loss to achieve an ideal body weight  Continue drinking coffee daily as  discussed  Office visit with [place] in 3 months

## 2013-11-14 ENCOUNTER — Telehealth: Payer: Self-pay | Admitting: Internal Medicine

## 2013-11-14 NOTE — Telephone Encounter (Signed)
Pt received a letter in the mail saying that she needed to have blood work done. She said that she had labs done a month ago and why does she need to have more done. Please advise and call her back at [phone]

## 2013-11-14 NOTE — Telephone Encounter (Signed)
Lab order that was on file and sent to the pt, is from 01/2013 blood work. Pt already had blood work done and doesn't need it at this time. I called and informed pt. She is going to wait and discuss hepatitis vaccines when she comes in for an ov in December. Went over her procedure discharge instructions with her from the hospital because she said she didn't remember anything and she wanted to make sure her husband was correct. She said she is feeling good at this time.

## 2013-11-15 ENCOUNTER — Ambulatory Visit (INDEPENDENT_AMBULATORY_CARE_PROVIDER_SITE_OTHER): Payer: BC Managed Care – PPO | Admitting: Psychology

## 2013-11-15 DIAGNOSIS — F411 Generalized anxiety disorder: Secondary | ICD-10-CM

## 2013-11-15 DIAGNOSIS — F332 Major depressive disorder, recurrent severe without psychotic features: Secondary | ICD-10-CM

## 2013-11-16 ENCOUNTER — Encounter (HOSPITAL_COMMUNITY): Payer: Self-pay | Admitting: Internal Medicine

## 2013-11-17 ENCOUNTER — Encounter (HOSPITAL_COMMUNITY): Payer: Self-pay | Admitting: Psychology

## 2013-11-17 NOTE — Progress Notes (Signed)
   PROGRESS NOTE   Patient:  Loretta Schaefer   DOB: 09-28-1970  MR Number: [id number]  Location: [facility] [address] [place] [address] Dept: [phone]  Start: 4 PM End: 5 PM  Provider/Observer:     Edgardo Roys PSYD  Chief Complaint:      Chief Complaint  Patient presents with  . Depression  . Anxiety  . Stress    Reason For Service:   The patient was referred because of persistent and increasing symptoms of depression. The patient reports that the first symptoms she experienced a depression happened after her sister died in 2004-06-08. She reports that her mother then passed away in June 08, 2008 after the patient had taking care of her mother for the prior 2 years. She reports that her depression has worsened after this time. She was treated initially after her sister died for depression and she got better but then her depression again worsened. Her primary care physician referred her to our office.   Interventions Strategy:  Cognitive/behavioral psychotherapeutic interventions  Participation Level:   Active  Participation Quality:  Appropriate      Behavioral Observation:  Well Groomed, Alert, and Appropriate.   Current Psychosocial Factors: The patient reports that things have continued to improve her overall functioning in her life situation. She reports that she has had some stressful interactions with her sister but has gained the ability to not do well on the actions of her sister and the manipulations and personality issues with her sister. She reports this is reduced a great deal of stress for her.  Content of Session:   Review current symptoms and continue to work on therapeutic interventions are in issues of recurrent depression and anxiety. We have also been working on sleep hygiene issues as well.  Current Status:   The patient reports that she is now been doing much better  emotionally and feeling more competent dealing with a wide range of stressors.  Patient Progress:   Overall the patient has been progressing well and continuing to meet target goals.  Target Goals:   Target goals are to decrease the overall intensity, frequency, and duration of depressive events and improved issues related to her anxiety and intrusive thoughts.  Last Reviewed:   8// [id number]   Addressed Today:    Today we worked on the cognitive skills of dealing with intrusive thoughts and anxiety as well as sleep hygiene issues.  Impression/Diagnosis:   The patient denies a long-standing history of mood swings are consistent and persistent episodes of of depression. She doesn't now is that she has become depressed both in 06/08/2004 and then again with the death of her mother in 06/08/08.   Diagnosis:    Axis I:  Major depressive disorder, recurrent episode, severe, without mention of psychotic behavior  Anxiety state, unspecified      Axis II: No diagnosis     Doylene Splinter R, PsyD 11/17/2013

## 2013-11-22 NOTE — Telephone Encounter (Signed)
Agree 

## 2013-11-23 ENCOUNTER — Encounter: Payer: Self-pay | Admitting: Internal Medicine

## 2013-12-19 ENCOUNTER — Ambulatory Visit (INDEPENDENT_AMBULATORY_CARE_PROVIDER_SITE_OTHER): Payer: BC Managed Care – PPO | Admitting: Psychology

## 2013-12-19 DIAGNOSIS — F411 Generalized anxiety disorder: Secondary | ICD-10-CM

## 2013-12-19 DIAGNOSIS — F332 Major depressive disorder, recurrent severe without psychotic features: Secondary | ICD-10-CM

## 2013-12-20 ENCOUNTER — Encounter (HOSPITAL_COMMUNITY): Payer: Self-pay | Admitting: Psychology

## 2013-12-20 NOTE — Progress Notes (Signed)
   PROGRESS NOTE   Patient:  Loretta Schaefer   DOB: Mar 16, 1971  MR Number: [id number]  Location: [facility] [address] [place] [address] Dept: [phone]  Start: 4 PM End: 5 PM  Provider/Observer:     Edgardo Roys PSYD  Chief Complaint:      Chief Complaint  Patient presents with  . Depression  . Anxiety    Reason For Service:   The patient was referred because of persistent and increasing symptoms of depression. The patient reports that the first symptoms she experienced a depression happened after her sister died in 06-20-2004. She reports that her mother then passed away in Jun 20, 2008 after the patient had taking care of her mother for the prior 2 years. She reports that her depression has worsened after this time. She was treated initially after her sister died for depression and she got better but then her depression again worsened. Her primary care physician referred her to our office.   Interventions Strategy:  Cognitive/behavioral psychotherapeutic interventions  Participation Level:   Active  Participation Quality:  Appropriate      Behavioral Observation:  Well Groomed, Alert, and Appropriate.   Current Psychosocial Factors: The patient reports while she and her husband went on a vaction with just the two of them and she was able to put dog in kennel without too much stress or worry (only called to check on dog one time), the patient reports that her husband has been told that the place he is working is going to close down but no specifics are given by company.  Content of Session:   Review current symptoms and continue to work on therapeutic interventions are in issues of recurrent depression and anxiety. We have also been working on sleep hygiene issues as well.  Current Status:   The patient reports that she ireports that she is doing much better and even handled news of  husband/s job as well as expected.  Patient Progress:   Overall the patient has been progressing well and continuing to meet target goals.  Target Goals:   Target goals are to decrease the overall intensity, frequency, and duration of depressive events and improved issues related to her anxiety and intrusive thoughts.  Last Reviewed:   12/19/2013   Addressed Today:    Today we worked on the cognitive skills of dealing with intrusive thoughts and anxiety as well as sleep hygiene issues.  Impression/Diagnosis:   The patient denies a long-standing history of mood swings are consistent and persistent episodes of of depression. She doesn't now is that she has become depressed both in 06/20/2004 and then again with the death of her mother in 20-Jun-2008.   Diagnosis:    Axis I:  Major depressive disorder, recurrent episode, severe, without mention of psychotic behavior  Anxiety state, unspecified      Axis II: No diagnosis     RODENBOUGH,JOHN R, PsyD 12/20/2013

## 2014-01-11 ENCOUNTER — Other Ambulatory Visit: Payer: Self-pay | Admitting: Obstetrics and Gynecology

## 2014-01-11 DIAGNOSIS — Z1231 Encounter for screening mammogram for malignant neoplasm of breast: Secondary | ICD-10-CM

## 2014-01-24 ENCOUNTER — Ambulatory Visit (INDEPENDENT_AMBULATORY_CARE_PROVIDER_SITE_OTHER): Payer: BC Managed Care – PPO | Admitting: Psychology

## 2014-01-24 DIAGNOSIS — F419 Anxiety disorder, unspecified: Secondary | ICD-10-CM

## 2014-01-26 ENCOUNTER — Encounter (HOSPITAL_COMMUNITY): Payer: Self-pay | Admitting: Psychology

## 2014-01-26 NOTE — Progress Notes (Signed)
   PROGRESS NOTE   Patient:  Loretta Schaefer   DOB: 11/14/1970  MR Number: [id number]  Location: [facility] [address] [place] [address] Dept: [phone]  Start: 4 PM End: 5 PM  Provider/Observer:     Edgardo Roys PSYD  Chief Complaint:      Chief Complaint  Patient presents with  . Agitation  . Anxiety  . Stress    Reason For Service:   The patient was referred because of persistent and increasing symptoms of depression. The patient reports that the first symptoms she experienced a depression happened after her sister died in 13-Jun-2004. She reports that her mother then passed away in 06-13-08 after the patient had taking care of her mother for the prior 2 years. She reports that her depression has worsened after this time. She was treated initially after her sister died for depression and she got better but then her depression again worsened. Her primary care physician referred her to our office.   Interventions Strategy:  Cognitive/behavioral psychotherapeutic interventions  Participation Level:   Active  Participation Quality:  Appropriate      Behavioral Observation:  Well Groomed, Alert, and Appropriate.   Current Psychosocial Factors: The patient reports while she and her husband went on a vaction with just the two of them and she was able to put dog in kennel without too much stress or worry (only called to check on dog one time), the patient reports that her husband has been told that the place he is working is going to close down but no specifics are given by company.  Content of Session:   Review current symptoms and continue to work on therapeutic interventions are in issues of recurrent depression and anxiety. We have also been working on sleep hygiene issues as well.  Current Status:   The patient reports that she ireports that she is doing much better and even handled news  of husband/s job as well as expected.  Patient Progress:   Overall the patient has been progressing well and continuing to meet target goals.  Target Goals:   Target goals are to decrease the overall intensity, frequency, and duration of depressive events and improved issues related to her anxiety and intrusive thoughts.  Last Reviewed:   10/28   Addressed Today:    Today we worked on the cognitive skills of dealing with intrusive thoughts and anxiety as well as sleep hygiene issues.  Impression/Diagnosis:   The patient denies a long-standing history of mood swings are consistent and persistent episodes of of depression. She doesn't now is that she has become depressed both in 06-13-04 and then again with the death of her mother in 06-13-2008.   Diagnosis:    Axis I:  Anxiety      Axis II: No diagnosis     RODENBOUGH,JOHN R, PsyD 01/26/2014

## 2014-01-29 ENCOUNTER — Encounter (HOSPITAL_COMMUNITY): Payer: Self-pay | Admitting: Psychology

## 2014-02-06 ENCOUNTER — Encounter: Payer: Self-pay | Admitting: Adult Health

## 2014-02-06 ENCOUNTER — Ambulatory Visit (INDEPENDENT_AMBULATORY_CARE_PROVIDER_SITE_OTHER): Payer: BC Managed Care – PPO | Admitting: Adult Health

## 2014-02-06 VITALS — BP 134/70 | HR 72 | Ht 60.5 in | Wt 163.0 lb

## 2014-02-06 DIAGNOSIS — Z01419 Encounter for gynecological examination (general) (routine) without abnormal findings: Secondary | ICD-10-CM

## 2014-02-06 DIAGNOSIS — N951 Menopausal and female climacteric states: Secondary | ICD-10-CM

## 2014-02-06 DIAGNOSIS — Z1212 Encounter for screening for malignant neoplasm of rectum: Secondary | ICD-10-CM

## 2014-02-06 HISTORY — DX: Menopausal and female climacteric states: N95.1

## 2014-02-06 LAB — HEMOCCULT GUIAC POC 1CARD (OFFICE): Fecal Occult Blood, POC: NEGATIVE

## 2014-02-06 NOTE — Progress Notes (Signed)
Patient ID: Loretta Schaefer, female   DOB: [birth year], 43 y.o.   MRN: [id number] History of Present Illness: Loretta Schaefer is a 43 year old white female, married, in for gyn exam,she complains of some hair loss and being moody.   Current Medications, Allergies, Past Medical History, Past Surgical History, Family History and Social History were reviewed in [employer] record.     Review of Systems: Patient denies any headaches, blurred vision, shortness of breath, chest pain, abdominal pain, problems with bowel movements, urination, or intercourse. No joint pain ,see HPI. She had labs yesterday with PCP,Dr Woody Seller and will see Dr Gala Romney Monday.She had [place] in August with some liver issues and had EGD.Denies any hot flashes or night sweats yet.    Physical Exam:BP 134/70 mmHg  Pulse 72  Ht 5' 0.5" (1.537 m)  Wt 163 lb (73.936 kg)  BMI 31.30 kg/m2 General:  Well developed, well nourished, no acute distress Skin:  Warm and dry Neck:  Midline trachea, normal thyroid Lungs; Clear to auscultation bilaterally Breast:  No dominant palpable mass, retraction, or nipple discharge Cardiovascular: Regular rate and rhythm Abdomen:  Soft, non tender, no hepatosplenomegaly Pelvic:  External genitalia is normal in appearance, no lesions.  The vagina is normal in appearance with good color, moisture and rugare. The cervix and uterus are absent. No  adnexal masses or tenderness noted. Rectal: Good sphincter tone, no polyps,internal hemorrhoids felt.  Hemoccult negative. Extremities:  No swelling or varicosities noted Psych:alert and cooperative,seems happy, moody at times, declines meds   Impression: Well woman gyn exam no pap Peri menopausal     Plan: Physical in 1 year Mammogram yearly  Labs with PCP Colonoscopy per Dr Gala Romney   Try different shampoo and await labs Review handout on peri menopause

## 2014-02-06 NOTE — Patient Instructions (Addendum)
physical in 1 year Mammogram yearly Colonoscopy per GI Labs with PCP Perimenopause Perimenopause is the time when your body begins to move into the menopause (no menstrual period for 12 straight months). It is a natural process. Perimenopause can begin 2-8 years before the menopause and usually lasts for 1 year after the menopause. During this time, your ovaries may or may not produce an egg. The ovaries vary in their production of estrogen and progesterone hormones each month. This can cause irregular menstrual periods, difficulty getting pregnant, vaginal bleeding between periods, and uncomfortable symptoms. CAUSES  Irregular production of the ovarian hormones, estrogen and progesterone, and not ovulating every month.  Other causes include:  Tumor of the pituitary gland in the brain.  Medical disease that affects the ovaries.  Radiation treatment.  Chemotherapy.  Unknown causes.  Heavy smoking and excessive alcohol intake can bring on perimenopause sooner. SIGNS AND SYMPTOMS   Hot flashes.  Night sweats.  Irregular menstrual periods.  Decreased sex drive.  Vaginal dryness.  Headaches.  Mood swings.  Depression.  Memory problems.  Irritability.  Tiredness.  Weight gain.  Trouble getting pregnant.  The beginning of losing bone cells (osteoporosis).  The beginning of hardening of the arteries (atherosclerosis). DIAGNOSIS  Your health care provider will make a diagnosis by analyzing your age, menstrual history, and symptoms. He or she will do a physical exam and note any changes in your body, especially your female organs. Female hormone tests may or may not be helpful depending on the amount of female hormones you produce and when you produce them. However, other hormone tests may be helpful to rule out other problems. TREATMENT  In some cases, no treatment is needed. The decision on whether treatment is necessary during the perimenopause should be made by you  and your health care provider based on how the symptoms are affecting you and your lifestyle. Various treatments are available, such as:  Treating individual symptoms with a specific medicine for that symptom.  Herbal medicines that can help specific symptoms.  Counseling.  Group therapy. HOME CARE INSTRUCTIONS   Keep track of your menstrual periods (when they occur, how heavy they are, how long between periods, and how long they last) as well as your symptoms and when they started.  Only take over-the-counter or prescription medicines as directed by your health care provider.  Sleep and rest.  Exercise.  Eat a diet that contains calcium (good for your bones) and soy (acts like the estrogen hormone).  Do not smoke.  Avoid alcoholic beverages.  Take vitamin supplements as recommended by your health care provider. Taking vitamin E may help in certain cases.  Take calcium and vitamin D supplements to help prevent bone loss.  Group therapy is sometimes helpful.  Acupuncture may help in some cases. SEEK MEDICAL CARE IF:   You have questions about any symptoms you are having.  You need a referral to a specialist (gynecologist, psychiatrist, or psychologist). SEEK IMMEDIATE MEDICAL CARE IF:   You have vaginal bleeding.  Your period lasts longer than 8 days.  Your periods are recurring sooner than 21 days.  You have bleeding after intercourse.  You have severe depression.  You have pain when you urinate.  You have severe headaches.  You have vision problems. Document Released: 04/23/2004 Document Revised: 01/04/2013 Document Reviewed: 10/13/2012 [facility] Patient Information 2015 [place], [place]. This information is not intended to replace advice given to you by your health care provider. Make sure you discuss any questions  you have with your health care provider.

## 2014-02-12 ENCOUNTER — Encounter: Payer: Self-pay | Admitting: Gastroenterology

## 2014-02-12 ENCOUNTER — Ambulatory Visit (INDEPENDENT_AMBULATORY_CARE_PROVIDER_SITE_OTHER): Payer: BC Managed Care – PPO | Admitting: Gastroenterology

## 2014-02-12 VITALS — BP 123/71 | HR 73 | Temp 97.7°F | Ht 61.0 in | Wt 163.0 lb

## 2014-02-12 DIAGNOSIS — K7581 Nonalcoholic steatohepatitis (NASH): Secondary | ICD-10-CM

## 2014-02-12 DIAGNOSIS — K219 Gastro-esophageal reflux disease without esophagitis: Secondary | ICD-10-CM

## 2014-02-12 NOTE — Progress Notes (Signed)
      Primary Care Physician: Glenda Chroman., MD  Primary Gastroenterologist:  Garfield Cornea, MD   Chief Complaint  Patient presents with  . Follow-up    HPI: Loretta Schaefer is a 43 y.o. female here for follow up of biopsy proven NASH (2000) and GERD. Mother and Aunt both had NASH cirrhosis. She had an abdominal ultrasound was elastography in July 2015 which showed Metavir fibrosis score of F4. EGD 10/2013 showed small hiatal hernia. No evidence of portal hypertension.   Feels well. No heartburn. Appetite good. Still goes to weight watchers, almost 1 year now. Has plateaued. Frustrated with no weight loss and actually overall weight has been up since May 2014 when she had gotten down to 148 pounds. Weight 172 pounds in June 2013. 163 pounds today. Exercise very limited due to foot pain. Denies constipation, diarrhea, melena, rectal bleeding.   Recent labs with PCP, we have requested results.  Current Outpatient Prescriptions  Medication Sig Dispense Refill  . Ibuprofen (MOTRIN PO) Take 200 mg by mouth as needed.     . pantoprazole (PROTONIX) 40 MG tablet Take 1 tablet (40 mg total) by mouth daily. 30 tablet 11  . Red Yeast Rice 600 MG CAPS Take by mouth daily.    . vitamin E 400 UNIT capsule Take 400 Units by mouth daily.     No current facility-administered medications for this visit.    Allergies as of 02/12/2014 - Review Complete 02/12/2014  Allergen Reaction Noted  . Omnipaque [iohexol] Hives and Itching 06/02/2010    ROS:  General: Negative for anorexia, weight loss, fever, chills, fatigue, weakness. ENT: Negative for hoarseness, difficulty swallowing , nasal congestion. CV: Negative for chest pain, angina, palpitations, dyspnea on exertion, peripheral edema.  Respiratory: Negative for dyspnea at rest, dyspnea on exertion, cough, sputum, wheezing.  GI: See history of present illness. GU:  Negative for dysuria, hematuria, urinary incontinence, urinary frequency, nocturnal  urination.  Endo: Negative for unusual weight change.    Physical Examination:   BP 123/71 mmHg  Pulse 73  Temp(Src) 97.7 F (36.5 C) (Oral)  Ht 5' 1"  (1.549 m)  Wt 163 lb (73.936 kg)  BMI 30.81 kg/m2  General: Well-nourished, well-developed in no acute distress.  Eyes: No icterus. Mouth: Oropharyngeal mucosa moist and pink , no lesions erythema or exudate. Lungs: Clear to auscultation bilaterally.  Heart: Regular rate and rhythm, no murmurs rubs or gallops.  Abdomen: Bowel sounds are normal, nontender, nondistended, no hepatosplenomegaly or masses, no abdominal bruits or hernia , no rebound or guarding.   Extremities: No lower extremity edema. No clubbing or deformities. Neuro: Alert and oriented x 4   Skin: Warm and dry, no jaundice.   Psych: Alert and cooperative, normal mood and affect.

## 2014-02-12 NOTE — Assessment & Plan Note (Signed)
Biopsy-proven Loretta Schaefer, 2000. Recent F4 score on elastography but no evidence of portal hypertension on EGD. ?erroneously high Metavir score. Patient with significant family history of NASH cirrhosis as outline.   Instructions for fatty liver: Recommend 1-2# weight loss per week until ideal body weight through exercise & diet. Low fat/cholesterol diet.   Avoid sweets, sodas, fruit juices, sweetened beverages like tea, etc. Gradually increase exercise from 15 min daily up to 1 hr per day 5 days/week. Limit alcohol use.  We held giving vaccinations for Hep A/B. With her next labs, we will check immune status. Recently had labs with PCP. Have requested for review. Return to office to see Dr. Gala Romney in 6 months. Plan for abdominal u/s in 03/2014.

## 2014-02-12 NOTE — Assessment & Plan Note (Signed)
Doing well with current regimen.

## 2014-02-12 NOTE — Patient Instructions (Signed)
Instructions for fatty liver: Recommend 1-2# weight loss per week until ideal body weight through exercise & diet. Low fat/cholesterol diet.   Avoid sweets, sodas, fruit juices, sweetened beverages like tea, etc. Gradually increase exercise from 15 min daily up to 1 hr per day 5 days/week. Limit alcohol use. 2.   We will review your recent labs. 3.   Next abdominal ultrasound in 03/2014. We will call then to schedule. 4.   Return to the office in 6 months.

## 2014-02-13 ENCOUNTER — Ambulatory Visit: Payer: Self-pay | Admitting: Gastroenterology

## 2014-02-21 NOTE — Progress Notes (Signed)
cc'ed to pcp °

## 2014-02-26 ENCOUNTER — Ambulatory Visit (HOSPITAL_COMMUNITY)
Admission: RE | Admit: 2014-02-26 | Discharge: 2014-02-26 | Disposition: A | Discharge status: Home / Self Care | Payer: BC Managed Care – PPO | Source: Ambulatory Visit | Attending: Obstetrics and Gynecology | Admitting: Obstetrics and Gynecology

## 2014-02-26 ENCOUNTER — Ambulatory Visit (HOSPITAL_COMMUNITY): Payer: Self-pay

## 2014-02-26 DIAGNOSIS — Z1231 Encounter for screening mammogram for malignant neoplasm of breast: Secondary | ICD-10-CM

## 2014-03-01 ENCOUNTER — Ambulatory Visit (HOSPITAL_COMMUNITY): Payer: Self-pay | Admitting: Psychology

## 2014-03-26 ENCOUNTER — Ambulatory Visit (HOSPITAL_COMMUNITY): Payer: Self-pay | Admitting: Psychology

## 2014-04-12 ENCOUNTER — Ambulatory Visit (INDEPENDENT_AMBULATORY_CARE_PROVIDER_SITE_OTHER): Payer: Self-pay | Admitting: Psychology

## 2014-05-15 ENCOUNTER — Ambulatory Visit (HOSPITAL_COMMUNITY): Payer: Self-pay | Admitting: Psychology

## 2014-06-12 ENCOUNTER — Ambulatory Visit (INDEPENDENT_AMBULATORY_CARE_PROVIDER_SITE_OTHER): Payer: BLUE CROSS/BLUE SHIELD | Admitting: Psychology

## 2014-06-12 DIAGNOSIS — F419 Anxiety disorder, unspecified: Secondary | ICD-10-CM

## 2014-06-13 ENCOUNTER — Encounter (HOSPITAL_COMMUNITY): Payer: Self-pay | Admitting: Psychology

## 2014-06-13 NOTE — Progress Notes (Signed)
   PROGRESS NOTE   Patient:  Loretta Schaefer   DOB: Apr 26, 1970  MR Number: [id number]  Location: [facility] [address] [place] [address] Dept: [phone]  Start: 4 PM End: 5 PM  Provider/Observer:     Edgardo Roys PSYD  Chief Complaint:      Chief Complaint  Patient presents with  . Anxiety  . Depression  . Stress    Reason For Service:   The patient was referred because of persistent and increasing symptoms of depression. The patient reports that the first symptoms she experienced a depression happened after her sister died in 2004/06/22. She reports that her mother then passed away in 06-22-08 after the patient had taking care of her mother for the prior 2 years. She reports that her depression has worsened after this time. She was treated initially after her sister died for depression and she got better but then her depression again worsened. Her primary care physician referred her to our office.   Interventions Strategy:  Cognitive/behavioral psychotherapeutic interventions  Participation Level:   Active  Participation Quality:  Appropriate      Behavioral Observation:  Well Groomed, Alert, and Appropriate.   Current Psychosocial Factors: The patient reports that she has been actively working on further improving her overall coping skills. Patient reports that she has been working on her relationship with dealing with her sister as well as her relationship and issues with her brother.  Content of Session:   Review current symptoms and continue to work on therapeutic interventions are in issues of recurrent depression and anxiety. We have also been working on sleep hygiene issues as well.  Current Status:   The patient reports that she ireports that she is doing much better and even handled news of husband/s job as well as expected.  Patient Progress:   Overall the patient has been  progressing well and continuing to meet target goals.  Target Goals:   Target goals are to decrease the overall intensity, frequency, and duration of depressive events and improved issues related to her anxiety and intrusive thoughts.  Last Reviewed:   06/12/2014  Addressed Today:    Today we worked on the cognitive skills of dealing with intrusive thoughts and anxiety as well as sleep hygiene issues.  Impression/Diagnosis:   The patient denies a long-standing history of mood swings are consistent and persistent episodes of of depression. She doesn't now is that she has become depressed both in Jun 22, 2004 and then again with the death of her mother in 06-22-2008.   Diagnosis:    Axis I:  Anxiety      Axis II: No diagnosis     Eathel Pajak R, PsyD 06/13/2014

## 2014-07-23 ENCOUNTER — Ambulatory Visit (HOSPITAL_COMMUNITY): Payer: Self-pay | Admitting: Psychology

## 2014-07-30 ENCOUNTER — Encounter: Payer: Self-pay | Admitting: Internal Medicine

## 2014-08-06 ENCOUNTER — Ambulatory Visit (HOSPITAL_COMMUNITY): Payer: Self-pay | Admitting: Psychology

## 2014-08-14 ENCOUNTER — Ambulatory Visit (INDEPENDENT_AMBULATORY_CARE_PROVIDER_SITE_OTHER): Payer: BLUE CROSS/BLUE SHIELD | Admitting: Psychology

## 2014-08-14 DIAGNOSIS — F419 Anxiety disorder, unspecified: Secondary | ICD-10-CM

## 2014-08-16 ENCOUNTER — Encounter: Payer: Self-pay | Admitting: Gastroenterology

## 2014-08-16 ENCOUNTER — Other Ambulatory Visit: Payer: Self-pay

## 2014-08-16 ENCOUNTER — Ambulatory Visit (INDEPENDENT_AMBULATORY_CARE_PROVIDER_SITE_OTHER): Payer: BLUE CROSS/BLUE SHIELD | Admitting: Gastroenterology

## 2014-08-16 VITALS — BP 129/83 | HR 71 | Temp 98.3°F | Ht 61.0 in | Wt 164.8 lb

## 2014-08-16 DIAGNOSIS — K7581 Nonalcoholic steatohepatitis (NASH): Secondary | ICD-10-CM | POA: Diagnosis not present

## 2014-08-16 MED ORDER — PANTOPRAZOLE SODIUM 40 MG PO TBEC: 40.0000 mg | DELAYED_RELEASE_TABLET | Freq: Every day | ORAL | Status: DC

## 2014-08-16 NOTE — Patient Instructions (Signed)
Continue weight watchers and increase physical activity to most days of the week. You are doing a good job!  We will see you back in 6 months.

## 2014-08-17 ENCOUNTER — Ambulatory Visit (HOSPITAL_COMMUNITY)
Admission: RE | Admit: 2014-08-17 | Discharge: 2014-08-17 | Disposition: A | Discharge status: Home / Self Care | Payer: BLUE CROSS/BLUE SHIELD | Source: Ambulatory Visit | Attending: Gastroenterology | Admitting: Gastroenterology

## 2014-08-17 DIAGNOSIS — K7581 Nonalcoholic steatohepatitis (NASH): Secondary | ICD-10-CM | POA: Insufficient documentation

## 2014-08-17 NOTE — Progress Notes (Signed)
Referring Provider: Glenda Chroman., MD Primary Care Physician:  Glenda Chroman., MD  Primary GI: Dr. Gala Romney   Chief Complaint  Patient presents with  . Follow-up    HPI:   Loretta Schaefer is a 44 y.o. female presenting today with a history of biopsy proven NASH (2000) and GERD. Mother and Aunt both had NASH cirrhosis. She had an abdominal ultrasound was elastography in July 2015 which showed Metavir fibrosis score of F4. EGD 10/2013 showed small hiatal hernia. No evidence of portal hypertension.   Worried about weight. Doing weight watchers.. Small amount of exercise, 2-3 days per week. No weight lifting. Stress eater. Up 1 lb from Nov 2015.   Past Medical History  Diagnosis Date  . Steatohepatitis 2000    without cirrhosis  . HTN (hypertension)   . History of pyelonephritis 2003    right  . Anxiety   . Depression   . Hyperplastic colon polyp 06/25/10    colonoscopy by Dr. Gala Romney  . Condyloma   . Peri-menopause 02/06/2014    Past Surgical History  Procedure Laterality Date  . Cesarean section      x 2  . Panniculectomy  2005  . S/p hysterectomy  2005  . Esophagogastroduodenoscopy  05/2010    distal ERE, small hh  . Colonoscopy  05/2010    normal TI, hyperplastic rectal polyp, friable anal canal  . Esophagogastroduodenoscopy N/A 11/13/2013    HKV:QQVZD hiatal hernia; otherwise, negative EGD.Nostigmata of cirrhosis foundPatient may not have cirrhosis at  . Toe surgery      surgery on left big toe at joint    Current Outpatient Prescriptions  Medication Sig Dispense Refill  . Ibuprofen (MOTRIN PO) Take 200 mg by mouth as needed.     . pantoprazole (PROTONIX) 40 MG tablet Take 1 tablet (40 mg total) by mouth daily. 30 tablet 11  . Red Yeast Rice 600 MG CAPS Take by mouth daily.    . vitamin E 400 UNIT capsule Take 400 Units by mouth daily.     No current facility-administered medications for this visit.    Allergies as of 08/16/2014 - Review Complete 06/13/2014    Allergen Reaction Noted  . Omnipaque [iohexol] Hives and Itching 06/02/2010    Family History  Problem Relation Age of Onset  . Colon polyps Mother     less than age 8  . Cirrhosis Mother     NASH, deceased  . Depression Mother   . Diabetes Mother   . Heart disease Mother   . Cirrhosis Maternal Aunt     NASH  . Bipolar disorder Sister   . Asthma Sister   . Depression Sister     bipolar  . COPD Sister   . Asthma Brother   . Diabetes Sister   . Hypertension Sister   . Other Sister     degenerative disc in back  . Supraventricular tachycardia Son   . Asthma Son   . Heart attack Father   . COPD Father   . Emphysema Father     History   Social History  . Marital Status: Married    Spouse Name: N/A  . Number of Children: N/A  . Years of Education: N/A   Social History Main Topics  . Smoking status: Never Smoker   . Smokeless tobacco: Never Used  . Alcohol Use: No  . Drug Use: No  . Sexual Activity: Yes    Birth Control/ Protection: Surgical  Other Topics Concern  . None   Social History Narrative    Review of Systems: Negative unless mentioned in HPI.   Physical Exam: BP 129/83 mmHg  Pulse 71  Temp(Src) 98.3 F (36.8 C) (Oral)  Ht 5' 1"  (1.549 m)  Wt 164 lb 12.8 oz (74.753 kg)  BMI 31.15 kg/m2 General:   Alert and oriented. No distress noted. Pleasant and cooperative.  Head:  Normocephalic and atraumatic. Eyes:  Conjuctiva clear without scleral icterus. Mouth:  Oral mucosa pink and moist. Good dentition. No lesions. Heart:  S1, S2 present without murmurs, rubs, or gallops. Regular rate and rhythm. Abdomen:  +BS, soft, non-tender and non-distended. No rebound or guarding. No HSM or masses noted. Msk:  Symmetrical without gross deformities. Normal posture. Extremities:  Without edema. Neurologic:  Alert and  oriented x4;  grossly normal neurologically. Skin:  Intact without significant lesions or rashes. Psych:  Alert and cooperative. Normal mood  and affect.  LFTs Nov 2015 scanned into epic. Normal.

## 2014-08-17 NOTE — Psych (Signed)
   THERAPIST PROGRESS NOTE    Edgardo Roys, PsyD 08/17/2014

## 2014-08-20 ENCOUNTER — Other Ambulatory Visit: Payer: Self-pay

## 2014-08-20 ENCOUNTER — Telehealth: Payer: Self-pay | Admitting: Gastroenterology

## 2014-08-20 DIAGNOSIS — K7581 Nonalcoholic steatohepatitis (NASH): Secondary | ICD-10-CM

## 2014-08-20 NOTE — Telephone Encounter (Signed)
Pt is aware. Lab order done and faxed to the lab. She said she would try to have it done this week.

## 2014-08-20 NOTE — Assessment & Plan Note (Signed)
Biopsy-proven NASH in 2000 with Metavir score 4 on elastography last year but no evidence of portal hypertension on recent EGD. LFTs Nov 2015 normal. Discussed dietary/behavior modification in detail. Needs updated labs and assessing Hep A and B immune status. Proceed with [place] now. Will repeat elastography in 1 year but routine ultrasound now.

## 2014-08-20 NOTE — Progress Notes (Signed)
6 month f/u reminder placed in Epic

## 2014-08-20 NOTE — Telephone Encounter (Signed)
I forgot to get this at her appt:  Needs updated LFTs, Hep A antibody and Hep B antibody. Checking immune status to see if she needs vaccinations.

## 2014-08-21 NOTE — Progress Notes (Signed)
Quick Note:  Fatty liver as noted. Will recommend elastography in 6 months. ______

## 2014-08-22 NOTE — Progress Notes (Signed)
CC'ED TO PCP 

## 2014-08-23 LAB — HEPATIC FUNCTION PANEL
ALT: 12 U/L (ref 0–35)
AST: 15 U/L (ref 0–37)
Albumin: 4.4 g/dL (ref 3.5–5.2)
Alkaline Phosphatase: 43 U/L (ref 39–117)
Bilirubin, Direct: <0.1 mg/dL (ref 0.0–0.3)
Total Bilirubin: 0.4 mg/dL (ref 0.2–1.2)
Total Protein: 6.9 g/dL (ref 6.0–8.3)

## 2014-08-23 LAB — HEPATITIS A ANTIBODY, IGM: HEP A IGM: NONREACTIVE

## 2014-08-23 LAB — HEPATITIS B CORE ANTIBODY, TOTAL: HEP B C TOTAL AB: NONREACTIVE

## 2014-08-23 NOTE — Progress Notes (Signed)
Quick Note:  Recall made ______

## 2014-08-30 NOTE — Telephone Encounter (Signed)
Pt is calling to see the results of her blood work. Please advise

## 2014-08-31 NOTE — Telephone Encounter (Signed)
Please see result notes.

## 2014-08-31 NOTE — Progress Notes (Signed)
Quick Note:  LFTs normal and look great. She is not immune to Hep A or B, so she needs the Hep A and B vaccinations. Can get this from her PCP. ______

## 2014-08-31 NOTE — Telephone Encounter (Signed)
Pt is aware.  

## 2014-09-14 ENCOUNTER — Ambulatory Visit (HOSPITAL_COMMUNITY): Payer: Self-pay | Admitting: Psychology

## 2014-09-21 ENCOUNTER — Ambulatory Visit (HOSPITAL_COMMUNITY): Payer: Self-pay | Admitting: Psychology

## 2014-10-02 ENCOUNTER — Ambulatory Visit (INDEPENDENT_AMBULATORY_CARE_PROVIDER_SITE_OTHER): Payer: BLUE CROSS/BLUE SHIELD | Admitting: Psychology

## 2014-10-02 DIAGNOSIS — F419 Anxiety disorder, unspecified: Secondary | ICD-10-CM | POA: Diagnosis not present

## 2014-10-04 NOTE — Progress Notes (Signed)
   PROGRESS NOTE   Patient:  Loretta Schaefer   DOB: 24-Dec-1970  MR Number: [id number]  Location: [facility] [address] [place] [address] Dept: [phone]  Start: 10 AM End: 11 AM  Provider/Observer:     Edgardo Roys PSYD  Chief Complaint:      Chief Complaint  Patient presents with  . Anxiety    Reason For Service:   The patient was referred because of persistent and increasing symptoms of depression. The patient reports that the first symptoms she experienced a depression happened after her sister died in 06-17-2004. She reports that her mother then passed away in 17-Jun-2008 after the patient had taking care of her mother for the prior 2 years. She reports that her depression has worsened after this time. She was treated initially after her sister died for depression and she got better but then her depression again worsened. Her primary care physician referred her to our office.   Interventions Strategy:  Cognitive/behavioral psychotherapeutic interventions  Participation Level:   Active  Participation Quality:  Appropriate      Behavioral Observation:  Well Groomed, Alert, and Appropriate.   Current Psychosocial Factors: The patient reports that she has talked with her daughter about the patient's concern regarding the daughter looking to have a second child and expecting the patient to take care of both of her children when the daughter is working. The patient reports that her daughter acted frustrated at first but does appear to have responded pretty well to it. The patient reports that she continues to experience anxiety around issues with her brother and sister.  Content of Session:   Review current symptoms and continue to work on therapeutic interventions are in issues of recurrent depression and anxiety. We have also been working on sleep hygiene issues as well.  Current  Status:   The patient reports that she ireports that she is doing much better and even handled news of husband/s job as well as expected.  Patient Progress:   Overall the patient has been progressing well and continuing to meet target goals.  Target Goals:   Target goals are to decrease the overall intensity, frequency, and duration of depressive events and improved issues related to her anxiety and intrusive thoughts.  Last Reviewed:   08/02/2014  Addressed Today:    Today we worked on the cognitive skills of dealing with intrusive thoughts and anxiety as well as sleep hygiene issues.  Impression/Diagnosis:   The patient denies a long-standing history of mood swings are consistent and persistent episodes of of depression. She doesn't now is that she has become depressed both in 06/17/04 and then again with the death of her mother in 06-17-08.   Diagnosis:    Axis I:  Anxiety      Axis II: No diagnosis     RODENBOUGH,JOHN R, PsyD 10/04/2014

## 2014-11-07 ENCOUNTER — Ambulatory Visit (HOSPITAL_COMMUNITY): Payer: Self-pay | Admitting: Psychology

## 2014-11-14 ENCOUNTER — Ambulatory Visit (INDEPENDENT_AMBULATORY_CARE_PROVIDER_SITE_OTHER): Payer: BLUE CROSS/BLUE SHIELD | Admitting: Psychology

## 2014-11-14 DIAGNOSIS — F419 Anxiety disorder, unspecified: Secondary | ICD-10-CM | POA: Diagnosis not present

## 2014-12-19 ENCOUNTER — Ambulatory Visit (HOSPITAL_COMMUNITY): Payer: Self-pay | Admitting: Psychology

## 2014-12-26 ENCOUNTER — Encounter (HOSPITAL_COMMUNITY): Payer: Self-pay | Admitting: Psychology

## 2014-12-26 ENCOUNTER — Ambulatory Visit (INDEPENDENT_AMBULATORY_CARE_PROVIDER_SITE_OTHER): Payer: BLUE CROSS/BLUE SHIELD | Admitting: Psychology

## 2014-12-26 DIAGNOSIS — F419 Anxiety disorder, unspecified: Secondary | ICD-10-CM | POA: Diagnosis not present

## 2014-12-26 NOTE — Progress Notes (Signed)
   PROGRESS NOTE   Patient:  Loretta Schaefer   DOB: Nov 06, 1970  MR Number: [id number]  Location: [facility] [address] [place] [address] Dept: [phone]  Start: 10 AM End: 11 AM  Provider/Observer:     Edgardo Roys PSYD  Chief Complaint:      Chief Complaint  Patient presents with  . Anxiety    Reason For Service:   The patient was referred because of persistent and increasing symptoms of depression. The patient reports that the first symptoms she experienced a depression happened after her sister died in June 04, 2004. She reports that her mother then passed away in Jun 04, 2008 after the patient had taking care of her mother for the prior 2 years. She reports that her depression has worsened after this time. She was treated initially after her sister died for depression and she got better but then her depression again worsened. Her primary care physician referred her to our office.   Interventions Strategy:  Cognitive/behavioral psychotherapeutic interventions  Participation Level:   Active  Participation Quality:  Appropriate      Behavioral Observation:  Well Groomed, Alert, and Appropriate.   Current Psychosocial Factors: The patient reports that she has been able to go on vacation with her husband.  While she had trouble with driving (it makes her very scared) she was able to get there and have a good time.  She is worried that her daughter is still thinking about the patient taking care of the new baby.  Content of Session:   Review current symptoms and continue to work on therapeutic interventions are in issues of recurrent depression and anxiety. We have also been working on sleep hygiene issues as well.  Current Status:   The patient reports that she is doing much better.  Her husband has gottne a new job with [employer] and she has done will with her kids issues.  Patient  Progress:   Overall the patient has been progressing well and continuing to meet target goals.  Target Goals:   Target goals are to decrease the overall intensity, frequency, and duration of depressive events and improved issues related to her anxiety and intrusive thoughts.  Last Reviewed:   12/26/2014  Addressed Today:    Today we worked on the cognitive skills of dealing with intrusive thoughts and anxiety as well as sleep hygiene issues.  Impression/Diagnosis:   The patient denies a long-standing history of mood swings are consistent and persistent episodes of of depression. She doesn't now is that she has become depressed both in 06/04/04 and then again with the death of her mother in June 04, 2008.   Diagnosis:    Axis I:  Anxiety      Axis II: No diagnosis     RODENBOUGH,JOHN R, PsyD 12/26/2014

## 2015-01-10 ENCOUNTER — Encounter (HOSPITAL_COMMUNITY): Payer: Self-pay | Admitting: Psychology

## 2015-01-10 NOTE — Progress Notes (Signed)
   PROGRESS NOTE   Patient:  Loretta Schaefer   DOB: May 28, 1970  MR Number: [id number]  Location: [facility] [address] [place] [address] Dept: [phone]  Start: 10 AM End: 11 AM  Provider/Observer:     Edgardo Roys PSYD  Chief Complaint:      Chief Complaint  Patient presents with  . Anxiety    Reason For Service:   The patient was referred because of persistent and increasing symptoms of depression. The patient reports that the first symptoms she experienced a depression happened after her sister died in 06/23/2004. She reports that her mother then passed away in 06-23-2008 after the patient had taking care of her mother for the prior 2 years. She reports that her depression has worsened after this time. She was treated initially after her sister died for depression and she got better but then her depression again worsened. Her primary care physician referred her to our office.   Interventions Strategy:  Cognitive/behavioral psychotherapeutic interventions  Participation Level:   Active  Participation Quality:  Appropriate      Behavioral Observation:  Well Groomed, Alert, and Appropriate.   Current Psychosocial Factors: The patient reports that she has talked with her daughter about the patient's concern regarding the daughter looking to have a second child and expecting the patient to take care of both of her children when the daughter is working. The patient reports that her daughter acted frustrated at first but does appear to have responded pretty well to it. The patient reports that she continues to experience anxiety around issues with her brother and sister.  Content of Session:   Review current symptoms and continue to work on therapeutic interventions are in issues of recurrent depression and anxiety. We have also been working on sleep hygiene issues as well.  Current  Status:   The patient reports that she ireports that she is doing much better and even handled news of husband/s job as well as expected.  Patient Progress:   Overall the patient has been progressing well and continuing to meet target goals.  Target Goals:   Target goals are to decrease the overall intensity, frequency, and duration of depressive events and improved issues related to her anxiety and intrusive thoughts.  Last Reviewed:   11/14/2014  Addressed Today:    Today we worked on the cognitive skills of dealing with intrusive thoughts and anxiety as well as sleep hygiene issues.  Impression/Diagnosis:   The patient denies a long-standing history of mood swings are consistent and persistent episodes of of depression. She doesn't now is that she has become depressed both in Jun 23, 2004 and then again with the death of her mother in Jun 23, 2008.   Diagnosis:    Axis I:  Anxiety      Axis II: No diagnosis     Stephane Niemann R, PsyD 01/10/2015

## 2015-01-14 ENCOUNTER — Telehealth: Payer: Self-pay | Admitting: Internal Medicine

## 2015-01-14 NOTE — Telephone Encounter (Signed)
NOV RECALL FOR ULTRASOUND WITH ELASTO

## 2015-01-14 NOTE — Telephone Encounter (Signed)
Mailed letter °

## 2015-01-18 ENCOUNTER — Other Ambulatory Visit: Payer: Self-pay

## 2015-01-18 ENCOUNTER — Other Ambulatory Visit: Payer: Self-pay | Admitting: Obstetrics and Gynecology

## 2015-01-18 DIAGNOSIS — Z1231 Encounter for screening mammogram for malignant neoplasm of breast: Secondary | ICD-10-CM

## 2015-01-18 DIAGNOSIS — K7581 Nonalcoholic steatohepatitis (NASH): Secondary | ICD-10-CM

## 2015-01-23 ENCOUNTER — Ambulatory Visit (INDEPENDENT_AMBULATORY_CARE_PROVIDER_SITE_OTHER): Payer: BLUE CROSS/BLUE SHIELD | Admitting: Psychology

## 2015-01-23 DIAGNOSIS — F419 Anxiety disorder, unspecified: Secondary | ICD-10-CM | POA: Diagnosis not present

## 2015-01-25 ENCOUNTER — Encounter (HOSPITAL_COMMUNITY): Payer: Self-pay | Admitting: Psychology

## 2015-01-25 NOTE — Progress Notes (Signed)
   PROGRESS NOTE   Patient:  Loretta Schaefer   DOB: 06/23/1970  MR Number: [id number]  Location: [facility] [address] [place] [address] Dept: [phone]  Start: 10 AM End: 11 AM  Provider/Observer:     Edgardo Roys PSYD  Chief Complaint:      Chief Complaint  Patient presents with  . Anxiety    Reason For Service:   The patient was referred because of persistent and increasing symptoms of depression. The patient reports that the first symptoms she experienced a depression happened after her sister died in 06/23/2004. She reports that her mother then passed away in 06-23-08 after the patient had taking care of her mother for the prior 2 years. She reports that her depression has worsened after this time. She was treated initially after her sister died for depression and she got better but then her depression again worsened. Her primary care physician referred her to our office.   Interventions Strategy:  Cognitive/behavioral psychotherapeutic interventions  Participation Level:   Active  Participation Quality:  Appropriate      Behavioral Observation:  Well Groomed, Alert, and Appropriate.   Current Psychosocial Factors: The patient reports that she has been able to go on vacation with her husband.  While she had trouble with driving (it makes her very scared) she was able to get there and have a good time.  She is worried that her daughter is still thinking about the patient taking care of the new baby.  Content of Session:   Review current symptoms and continue to work on therapeutic interventions are in issues of recurrent depression and anxiety. We have also been working on sleep hygiene issues as well.  Current Status:   The patient reports that she is doing much better.  Her husband has gottne a new job with [employer] and she has done will with her kids issues.  Patient  Progress:   Overall the patient has been progressing well and continuing to meet target goals.  Target Goals:   Target goals are to decrease the overall intensity, frequency, and duration of depressive events and improved issues related to her anxiety and intrusive thoughts.  Last Reviewed:   01/23/2015  Addressed Today:    Today we worked on the cognitive skills of dealing with intrusive thoughts and anxiety as well as sleep hygiene issues.  Impression/Diagnosis:   The patient denies a long-standing history of mood swings are consistent and persistent episodes of of depression. She doesn't now is that she has become depressed both in 2004-06-23 and then again with the death of her mother in 2008/06/23.   Diagnosis:    Axis I:  Anxiety      Axis II: No diagnosis     RODENBOUGH,JOHN R, PsyD 01/25/2015

## 2015-01-31 ENCOUNTER — Other Ambulatory Visit (HOSPITAL_COMMUNITY): Payer: Self-pay

## 2015-02-01 ENCOUNTER — Ambulatory Visit (HOSPITAL_COMMUNITY)
Admission: RE | Admit: 2015-02-01 | Discharge: 2015-02-01 | Disposition: A | Discharge status: Home / Self Care | Payer: BLUE CROSS/BLUE SHIELD | Source: Ambulatory Visit | Attending: Gastroenterology | Admitting: Gastroenterology

## 2015-02-01 DIAGNOSIS — K7581 Nonalcoholic steatohepatitis (NASH): Secondary | ICD-10-CM | POA: Diagnosis present

## 2015-02-10 NOTE — Progress Notes (Signed)
Quick Note:  Metavir F3/F4. Will continue surveillance, doing elastography once a year and routine ultrasound every 6 months, alternating with the elastography. ______

## 2015-02-11 ENCOUNTER — Ambulatory Visit (INDEPENDENT_AMBULATORY_CARE_PROVIDER_SITE_OTHER): Payer: BLUE CROSS/BLUE SHIELD | Admitting: Adult Health

## 2015-02-11 ENCOUNTER — Encounter: Payer: Self-pay | Admitting: Adult Health

## 2015-02-11 VITALS — BP 140/80 | HR 60 | Ht 60.5 in | Wt 165.5 lb

## 2015-02-11 DIAGNOSIS — N951 Menopausal and female climacteric states: Secondary | ICD-10-CM

## 2015-02-11 DIAGNOSIS — Z01419 Encounter for gynecological examination (general) (routine) without abnormal findings: Secondary | ICD-10-CM

## 2015-02-11 DIAGNOSIS — Z1212 Encounter for screening for malignant neoplasm of rectum: Secondary | ICD-10-CM | POA: Diagnosis not present

## 2015-02-11 LAB — HEMOCCULT GUIAC POC 1CARD (OFFICE): FECAL OCCULT BLD: NEGATIVE

## 2015-02-11 NOTE — Progress Notes (Signed)
Patient ID: Loretta Schaefer, female   DOB: 24-Nov-1970, 44 y.o.   MRN: [id number] History of Present Illness: Loretta Schaefer is a 44 year old white female,married in for a well woman gyn exam,she is sp hysterectomy. PCP is Dr Woody Seller.She is going to Weight [employer] is having hard time right now with losing.  Current Medications, Allergies, Past Medical History, Past Surgical History, Family History and Social History were reviewed in [facility] record.     Review of Systems:  Patient denies any headaches, hearing loss, fatigue, blurred vision, shortness of breath, chest pain, abdominal pain, problems with bowel movements, urination, or intercourse. No joint pain or mood swings.   Physical Exam:BP 140/80 mmHg  Pulse 60  Ht 5' 0.5" (1.537 m)  Wt 165 lb 8 oz (75.07 kg)  BMI 31.78 kg/m2 General:  Well developed, well nourished, no acute distress Skin:  Warm and dry Neck:  Midline trachea, normal thyroid, good ROM, no lymphadenopathy Lungs; Clear to auscultation bilaterally Breast:  No dominant palpable mass, retraction, or nipple discharge Cardiovascular: Regular rate and rhythm Abdomen:  Soft, mildly tender, no hepatosplenomegaly Pelvic:  External genitalia is normal in appearance, no lesions.  The vagina is normal in appearance. Urethra has no lesions or masses. The cervix and uterus are absent. No adnexal masses or tenderness noted.Bladder is non tender, no masses felt. Rectal: Good sphincter tone, no polyps, or hemorrhoids felt.  Hemoccult negative. Extremities/musculoskeletal:  No swelling or varicosities noted, no clubbing or cyanosis Psych:  No mood changes, alert and cooperative,seems happy She had her flu shot in October.She sees Dr Roseanne Kaufman office in am for her NASH.  Impression: Well woman gyn exam no pap Peri menopause    Plan: Physical in 1 year Mammogram yearly Labs with PCP Colonoscopy per GI Try increasing exercise

## 2015-02-11 NOTE — Patient Instructions (Signed)
Physical in  1 year Mammogram yearly Colonoscopy per GI Labs with PCP

## 2015-02-11 NOTE — Progress Notes (Signed)
ON RECALL  °

## 2015-02-12 ENCOUNTER — Ambulatory Visit: Payer: Self-pay | Admitting: Gastroenterology

## 2015-02-12 ENCOUNTER — Other Ambulatory Visit: Payer: Self-pay | Admitting: Adult Health

## 2015-02-15 ENCOUNTER — Ambulatory Visit (INDEPENDENT_AMBULATORY_CARE_PROVIDER_SITE_OTHER): Payer: BLUE CROSS/BLUE SHIELD | Admitting: Gastroenterology

## 2015-02-15 ENCOUNTER — Encounter: Payer: Self-pay | Admitting: Gastroenterology

## 2015-02-15 VITALS — BP 138/80 | HR 68 | Temp 98.1°F | Ht 61.0 in | Wt 163.4 lb

## 2015-02-15 DIAGNOSIS — K7581 Nonalcoholic steatohepatitis (NASH): Secondary | ICD-10-CM | POA: Diagnosis not present

## 2015-02-15 LAB — HEPATITIS A ANTIBODY, TOTAL: Hep A Total Ab: NONREACTIVE

## 2015-02-15 LAB — HEPATITIS B SURFACE ANTIBODY,QUALITATIVE: Hep B S Ab: NEGATIVE

## 2015-02-15 NOTE — Patient Instructions (Signed)
1. Please have your blood work done at [facility]. 2. Let's hold off on Hepatitis A and B vaccines for now, I need to add couple of other labs to check your immunity.  3. Please have Dr. Woody Seller send copy of upcoming labs to [place]. Fax [phone]. 4. Return to the office in six months for follow up.   Instructions for fatty liver: Recommend 1-2# weight loss per week until ideal body weight through exercise & diet. Low fat/cholesterol diet.   Avoid sweets, sodas, fruit juices, sweetened beverages like tea, etc. Gradually increase exercise from 15 min daily up to 1 hr per day 5 days/week. Limit alcohol use.

## 2015-02-15 NOTE — Progress Notes (Signed)
Primary Care Physician: Glenda Chroman., MD  Primary Gastroenterologist:  Garfield Cornea, MD   Chief Complaint  Patient presents with  . Follow-up    HPI: Loretta Schaefer is a 44 y.o. female here for follow-up. Biopsy-proven Nash in 2000 and GERD. Mother not both had [name] cirrhosis. Abdominal ultrasound with elastography July 2015 showed Metavir fibrosis score F4. Recent repeat study showed fibrosis score F3 and F4. EGD August 2015 with small hiatal hernia, no evidence of portal hypertension.  Clinically doing well. She continues to try to lose weight. Weight has been stable but no significant weight loss over the past one year. Minimal exercise 2-3 times per week. Continues to do Weight Wat[employer]. Denies abdominal pain, constipation, diarrhea, melena, rectal bleeding, heartburn. Scheduled for physical today.    Current Outpatient Prescriptions  Medication Sig Dispense Refill  . Ibuprofen (MOTRIN PO) Take 200 mg by mouth as needed.     . pantoprazole (PROTONIX) 40 MG tablet Take 1 tablet (40 mg total) by mouth daily. 30 tablet 11  . Red Yeast Rice 600 MG CAPS Take by mouth daily.    . vitamin E 400 UNIT capsule Take 400 Units by mouth daily.     No current facility-administered medications for this visit.    Allergies as of 02/15/2015 - Review Complete 02/15/2015  Allergen Reaction Noted  . Omnipaque [iohexol] Hives and Itching 06/02/2010   Past Surgical History  Procedure Laterality Date  . Cesarean section      x 2  . Panniculectomy  2005  . S/p hysterectomy  2005  . Esophagogastroduodenoscopy  05/2010    distal ERE, small hh  . Colonoscopy  05/2010    normal TI, hyperplastic rectal polyp, friable anal canal  . Esophagogastroduodenoscopy N/A 11/13/2013    [facility]:WEXHB hiatal hernia; otherwise, negative EGD.Nostigmata of cirrhosis foundPatient may not have cirrhosis at  . Toe surgery      surgery on left big toe at joint    ROS:  General: Negative for anorexia, weight  loss, fever, chills, fatigue, weakness. ENT: Negative for hoarseness, difficulty swallowing , nasal congestion. CV: Negative for chest pain, angina, palpitations, dyspnea on exertion, peripheral edema.  Respiratory: Negative for dyspnea at rest, dyspnea on exertion, cough, sputum, wheezing.  GI: See history of present illness. GU:  Negative for dysuria, hematuria, urinary incontinence, urinary frequency, nocturnal urination.  Endo: Negative for unusual weight change.    Physical Examination:   BP 138/80 mmHg  Pulse 68  Temp(Src) 98.1 F (36.7 C) (Oral)  Ht 5' 1"  (1.549 m)  Wt 163 lb 6.4 oz (74.118 kg)  BMI 30.89 kg/m2  General: Well-nourished, well-developed in no acute distress.  Eyes: No icterus. Mouth: Oropharyngeal mucosa moist and pink , no lesions erythema or exudate. Lungs: Clear to auscultation bilaterally.  Heart: Regular rate and rhythm, no murmurs rubs or gallops.  Abdomen: Bowel sounds are normal, nontender, nondistended, no hepatosplenomegaly or masses, no abdominal bruits or hernia , no rebound or guarding.   Extremities: No lower extremity edema. No clubbing or deformities. Neuro: Alert and oriented x 4   Skin: Warm and dry, no jaundice.   Psych: Alert and cooperative, normal mood and affect.  Labs:  No results found for: WBC, HGB, HCT, MCV, PLT Lab Results  Component Value Date   ALT 12 08/20/2014   AST 15 08/20/2014   ALKPHOS 43 08/20/2014   BILITOT 0.4 08/20/2014  albumin 4.4 No results found for: CREATININE, BUN, NA, K, CL,  CO2  Imaging Studies: US Abdomen Complete W/elastography  02/01/2015  CLINICAL DATA:  Nonalcoholic steatohepatitis. EXAM: ULTRASOUND ABDOMEN COMPLETE ULTRASOUND HEPATIC ELASTOGRAPHY TECHNIQUE: Sonography of the upper abdomen was performed. In addition, ultrasound elastography evaluation of the liver was performed. A region of interest was placed within the right lobe of the liver. Following application of a compressive sonographic  pulse, shear waves were detected in the adjacent hepatic tissue and the shear wave velocity was calculated. Multiple assessments were performed at the selected site. Median shear wave velocity is correlated to a Metavir fibrosis score. COMPARISON:  Ultrasound on 08/17/2014 FINDINGS: ULTRASOUND ABDOMEN Gallbladder: No gallstones or wall thickening visualized. No sonographic Murphy sign noted. Common bile duct: Diameter: 6 mm, within normal limits. Liver: No focal lesion identified. Within normal limits in parenchymal echogenicity. IVC: No abnormality visualized. Pancreas: Not well visualized due to overlying bowel gas. Spleen: Size and appearance within normal limits. Right Kidney: Length: 12.2 cm. Echogenicity within normal limits. No mass or hydronephrosis visualized. Left Kidney: Length: 12.0 cm. Echogenicity within normal limits. No mass or hydronephrosis visualized. Abdominal aorta: No aneurysm visualized. Other findings: None. ULTRASOUND HEPATIC ELASTOGRAPHY Device: Siemens Helix VTQ Transducer 4V 1 Patient position: Supine Number of measurements:  10 Hepatic Segment:  8 Median velocity:   3.43  m/sec IQR: 1.13 IQR/Median velocity ratio 0.9 Corresponding Metavir fibrosis score:  Some F3 +F4 Risk of fibrosis: High Limitations of exam: Hepatic motion increase sampled depth. Pertinent findings noted on other imaging exams:  None Please note that abnormal shear wave velocities may also be identified in clinical settings other than with hepatic fibrosis, such as: acute hepatitis, elevated right heart and central venous pressures including use of beta blockers, veno-occlusive disease (Budd-Chiari), infiltrative processes such as mastocytosis/amyloidosis/infiltrative tumor, extrahepatic cholestasis, in the post-prandial state, and liver transplantation. Correlation with patient history, laboratory data, and clinical condition recommended. IMPRESSION: Negative abdominal ultrasound. No hepatic mass or other sonographic  abnormality identified. Median hepatic shear wave velocity is calculated at 3.43 m/sec. Corresponding Metavir fibrosis score is some F3 +F4. Risk of fibrosis is high. Follow-up:  Followup advised Electronically Signed   By: Earle Gell M.D.   On: 02/01/2015 12:22

## 2015-02-15 NOTE — Assessment & Plan Note (Signed)
Biopsy-proven Loretta Schaefer (2000) with Metavir score of F3/F4 on recent elastography, last year F4 only. EGD with no evidence of portal hypertension. LFTs normal with albumin over 4. Suspect not a true F4, discussed with patient. Will plan on fiber sure testing for comparison. Discussed dietary/behavioral modification in detail. Need to increase exercise, daily. Need to further evaluate for Hep a and B immune status. Return to office in six months. She will have Dr. Woody Seller forward upcoming labs.  Regarding colonoscopy, she will be due next April for high risk screening colonoscopy.

## 2015-02-18 ENCOUNTER — Telehealth: Payer: Self-pay

## 2015-02-18 ENCOUNTER — Ambulatory Visit: Payer: Self-pay | Admitting: Gastroenterology

## 2015-02-18 NOTE — Progress Notes (Signed)
cc'ed to pcp °

## 2015-02-18 NOTE — Telephone Encounter (Addendum)
Received a courtesy copy of labs from [facility] ordered by Dr.Vyas. CBC, CMP and Lipid panel.   Placed with other papers/results for Neil Crouch, PA.

## 2015-02-20 LAB — LIVER FIBROSIS, FIBROTEST-ACTITEST
ALPHA-2-MACROGLOBULIN: 150 mg/dL (ref 106–279)
ALT: 13 U/L (ref 6–29)
APOLIPOPROTEIN A1: 151 mg/dL (ref 101–198)
Bilirubin: 0.4 mg/dL (ref 0.2–1.2)
FIBROSIS SCORE: 0.05
GGT: 15 U/L (ref 3–55)
Haptoglobin: 173 mg/dL (ref 43–212)
Necroinflammat ACT Score: 0.03
REFERENCE ID: 1371660

## 2015-02-23 NOTE — Progress Notes (Signed)
Quick Note:  Please let patient know that her Fibrotest suggests less very little to no fibrosis as opposed to the u/s elastography testing. She is not immune to hep a or B.  Would advise proceeding with Hep A and B vaccines.  She will be due follow up ov and high risk screening TCS in 06/2015. Change NIC for u/s with elastography to plain ruq u/s for hepatoma screening. ______

## 2015-03-01 ENCOUNTER — Ambulatory Visit (HOSPITAL_COMMUNITY)
Admission: RE | Admit: 2015-03-01 | Discharge: 2015-03-01 | Disposition: A | Discharge status: Home / Self Care | Payer: BLUE CROSS/BLUE SHIELD | Source: Ambulatory Visit | Attending: Obstetrics and Gynecology | Admitting: Obstetrics and Gynecology

## 2015-03-01 DIAGNOSIS — Z1231 Encounter for screening mammogram for malignant neoplasm of breast: Secondary | ICD-10-CM | POA: Diagnosis present

## 2015-03-06 ENCOUNTER — Ambulatory Visit (HOSPITAL_COMMUNITY): Payer: BLUE CROSS/BLUE SHIELD | Admitting: Psychology

## 2015-03-06 DIAGNOSIS — F419 Anxiety disorder, unspecified: Secondary | ICD-10-CM | POA: Diagnosis not present

## 2015-03-15 NOTE — Telephone Encounter (Signed)
Outside labs reviewed, results to be scanned. Dated 02/16/2015. White blood cell count 6400, hemoglobin 14.1, hematocrit 40.9, MCV 86, platelets 301,000, glucose 83, BUN 8, creatinine 0.82, potassium 4.2, calcium 9.1, total bilirubin 45, alkaline phosphatase 48, AST 16, ALT 13, albumin 4.5, TSH 1.750

## 2015-04-11 ENCOUNTER — Ambulatory Visit (HOSPITAL_COMMUNITY): Payer: Self-pay | Admitting: Psychology

## 2015-05-01 ENCOUNTER — Ambulatory Visit (INDEPENDENT_AMBULATORY_CARE_PROVIDER_SITE_OTHER): Payer: BLUE CROSS/BLUE SHIELD | Admitting: Psychology

## 2015-05-01 DIAGNOSIS — F419 Anxiety disorder, unspecified: Secondary | ICD-10-CM

## 2015-05-21 ENCOUNTER — Encounter (HOSPITAL_COMMUNITY): Payer: Self-pay | Admitting: Psychology

## 2015-05-21 NOTE — Progress Notes (Signed)
   PROGRESS NOTE   Patient:  Loretta Schaefer   DOB: November 15, 1970  MR Number: [id number]  Location: [facility] [address] [place] [address] Dept: [phone]  Start: 10 AM End: 11 AM  Provider/Observer:     Edgardo Roys PSYD  Chief Complaint:      Chief Complaint  Patient presents with  . Anxiety    Reason For Service:   The patient was referred because of persistent and increasing symptoms of depression. The patient reports that the first symptoms she experienced a depression happened after her sister died in 06/07/04. She reports that her mother then passed away in 06/07/08 after the patient had taking care of her mother for the prior 2 years. She reports that her depression has worsened after this time. She was treated initially after her sister died for depression and she got better but then her depression again worsened. Her primary care physician referred her to our office.   Interventions Strategy:  Cognitive/behavioral psychotherapeutic interventions  Participation Level:   Active  Participation Quality:  Appropriate      Behavioral Observation:  Well Groomed, Alert, and Appropriate.   Current Psychosocial Factors: The patient reports that she has been able to go on vacation with her husband.  While she had trouble with driving (it makes her very scared) she was able to get there and have a good time.  She is worried that her daughter is still thinking about the patient taking care of the new baby.  Content of Session:   Review current symptoms and continue to work on therapeutic interventions are in issues of recurrent depression and anxiety. We have also been working on sleep hygiene issues as well.  Current Status:   The patient reports that she is doing much better.  Her husband has gottne a new job with [employer] and she has done will with her kids issues.  Patient  Progress:   Overall the patient has been progressing well and continuing to meet target goals.  Target Goals:   Target goals are to decrease the overall intensity, frequency, and duration of depressive events and improved issues related to her anxiety and intrusive thoughts.  Last Reviewed:    03/06/2015  Addressed Today:    Today we worked on the cognitive skills of dealing with intrusive thoughts and anxiety as well as sleep hygiene issues.  Impression/Diagnosis:   The patient denies a long-standing history of mood swings are consistent and persistent episodes of of depression. She doesn't now is that she has become depressed both in 06/07/04 and then again with the death of her mother in 06-07-2008.   Diagnosis:    Axis I:  Anxiety      Axis II: No diagnosis     Chalene Treu R, PsyD 05/21/2015

## 2015-05-30 ENCOUNTER — Ambulatory Visit (HOSPITAL_COMMUNITY): Payer: Self-pay | Admitting: Psychology

## 2015-05-30 ENCOUNTER — Ambulatory Visit (HOSPITAL_COMMUNITY): Payer: BLUE CROSS/BLUE SHIELD | Admitting: Psychology

## 2015-05-30 DIAGNOSIS — F411 Generalized anxiety disorder: Secondary | ICD-10-CM

## 2015-06-27 ENCOUNTER — Ambulatory Visit (INDEPENDENT_AMBULATORY_CARE_PROVIDER_SITE_OTHER): Payer: BLUE CROSS/BLUE SHIELD | Admitting: Psychology

## 2015-06-27 ENCOUNTER — Encounter (HOSPITAL_COMMUNITY): Payer: Self-pay | Admitting: Psychology

## 2015-06-27 DIAGNOSIS — F419 Anxiety disorder, unspecified: Secondary | ICD-10-CM

## 2015-06-27 NOTE — Progress Notes (Signed)
   PROGRESS NOTE   Patient:  Loretta Schaefer   DOB: 1970/04/30  MR Number: [id number]  Location: [facility] [address] [place] [address] Dept: [phone]  Start: 10 AM End: 11 AM  Provider/Observer:     Edgardo Roys PSYD  Chief Complaint:      Chief Complaint  Patient presents with  . Anxiety    Reason For Service:   The patient was referred because of persistent and increasing symptoms of depression. The patient reports that the first symptoms she experienced a depression happened after her sister died in 05-30-04. She reports that her mother then passed away in 2008-05-30 after the patient had taking care of her mother for the prior 2 years. She reports that her depression has worsened after this time. She was treated initially after her sister died for depression and she got better but then her depression again worsened. Her primary care physician referred her to our office.   Interventions Strategy:  Cognitive/behavioral psychotherapeutic interventions  Participation Level:   Active  Participation Quality:  Appropriate      Behavioral Observation:  Well Groomed, Alert, and Appropriate.   Current Psychosocial Factors: The patient reports that she has been able to go on vacation with her husband.  While she had trouble with driving (it makes her very scared) she was able to get there and have a good time.  She is worried that her daughter is still thinking about the patient taking care of the new baby.  Content of Session:   Review current symptoms and continue to work on therapeutic interventions are in issues of recurrent depression and anxiety. We have also been working on sleep hygiene issues as well.  Current Status:   The patient reports that she is doing much better.  Her husband has gottne a new job with [employer] and she has done will with her kids issues.  Patient  Progress:   Overall the patient has been progressing well and continuing to meet target goals.  Target Goals:   Target goals are to decrease the overall intensity, frequency, and duration of depressive events and improved issues related to her anxiety and intrusive thoughts.  Last Reviewed:    06/27/2015  Addressed Today:    Today we worked on the cognitive skills of dealing with intrusive thoughts and anxiety as well as sleep hygiene issues.  Impression/Diagnosis:   The patient denies a long-standing history of mood swings are consistent and persistent episodes of of depression. She doesn't now is that she has become depressed both in 05-30-04 and then again with the death of her mother in 2008-05-30.   Diagnosis:    Axis I:  Anxiety      Axis II: No diagnosis     Pranshu Lyster R, PsyD 06/27/2015

## 2015-07-03 ENCOUNTER — Other Ambulatory Visit: Payer: Self-pay

## 2015-07-03 ENCOUNTER — Ambulatory Visit (INDEPENDENT_AMBULATORY_CARE_PROVIDER_SITE_OTHER): Payer: BLUE CROSS/BLUE SHIELD | Admitting: Gastroenterology

## 2015-07-03 ENCOUNTER — Encounter: Payer: Self-pay | Admitting: Gastroenterology

## 2015-07-03 VITALS — BP 148/84 | HR 66 | Temp 98.3°F | Ht 61.0 in | Wt 170.2 lb

## 2015-07-03 DIAGNOSIS — K7581 Nonalcoholic steatohepatitis (NASH): Secondary | ICD-10-CM

## 2015-07-03 DIAGNOSIS — Z860100 Personal history of colon polyps, unspecified: Secondary | ICD-10-CM | POA: Insufficient documentation

## 2015-07-03 MED ORDER — PEG 3350-KCL-NA BICARB-NACL 420 G PO SOLR: 4000.0000 mL | Freq: Once | ORAL | Status: DC

## 2015-07-03 NOTE — Assessment & Plan Note (Signed)
45 year old female with history of hyperplastic polyps but family history of colon polyps as well, due for surveillance colonoscopy now. No concerning lower GI symptoms.   Proceed with TCS with Dr. Gala Romney in near future: the risks, benefits, and alternatives have been discussed with the patient in detail. The patient states understanding and desires to proceed.

## 2015-07-03 NOTE — Progress Notes (Signed)
Referring Provider: Glenda Chroman., MD Primary Care Physician:  Glenda Chroman., MD  Primary GI: Dr. Gala Romney   Chief Complaint  Patient presents with  . Follow-up    HPI:   Loretta Schaefer is a 45 y.o. female presenting today with a history of biopsy-proven NASH (2000) and GERD. Mother and aunt both with NASH cirrhosis. Last elastography in July 2015 with Metavir score of F4. EGD Aug 2015 with small hiatal hernia and no evidence of portal hypertension. Last colonoscopy with hyperplastic rectal polyp in March 2012, due for surveillance colonoscopy now; mother with history of polyps. Fibrotest recently performed with very little to no fibrosis. Needs Hep A and B vaccination. US abdomen yearly recommended, will be due again in November this year.   Protonix helps with reflux symptoms. No dysphagia. No constipation, diarrhea, or rectal bleeding. Last round of Hep A/B vaccinations due in July 2017. Very frustrated with her weight. Knows what to do and how to do it but can't seem to get motivated.    Past Medical History  Diagnosis Date  . Steatohepatitis 2000    without cirrhosis  . HTN (hypertension)   . History of pyelonephritis 2003    right  . Anxiety   . Depression   . Hyperplastic colon polyp 06/25/10    colonoscopy by Dr. Gala Romney  . Condyloma   . Peri-menopause 02/06/2014    Past Surgical History  Procedure Laterality Date  . Cesarean section      x 2  . Panniculectomy  2005  . S/p hysterectomy  2005  . Esophagogastroduodenoscopy  05/2010    distal ERE, small hh  . Colonoscopy  05/2010    normal TI, hyperplastic rectal polyp, friable anal canal  . Esophagogastroduodenoscopy N/A 11/13/2013    XBM:WUXLK hiatal hernia; otherwise, negative EGD.Nostigmata of cirrhosis foundPatient may not have cirrhosis at  . Toe surgery      surgery on left big toe at joint    Current Outpatient Prescriptions  Medication Sig Dispense Refill  . Ibuprofen (MOTRIN PO) Take 200 mg by mouth as  needed.     . pantoprazole (PROTONIX) 40 MG tablet Take 1 tablet (40 mg total) by mouth daily. 30 tablet 11  . Red Yeast Rice 600 MG CAPS Take by mouth daily.    . vitamin E 400 UNIT capsule Take 400 Units by mouth daily.     No current facility-administered medications for this visit.    Allergies as of 07/03/2015 - Review Complete 07/03/2015  Allergen Reaction Noted  . Omnipaque [iohexol] Hives and Itching 06/02/2010    Family History  Problem Relation Age of Onset  . Colon polyps Mother     less than age 83  . Cirrhosis Mother     NASH, deceased  . Depression Mother   . Diabetes Mother   . Heart disease Mother   . Cirrhosis Maternal Aunt     NASH  . Bipolar disorder Sister   . Asthma Sister   . Depression Sister     bipolar  . COPD Sister   . Asthma Brother   . Diabetes Sister   . Hypertension Sister   . Other Sister     degenerative disc in back  . Supraventricular tachycardia Son   . Asthma Son   . Heart attack Father   . COPD Father   . Emphysema Father     Social History   Social History  . Marital Status: Married  Spouse Name: N/A  . Number of Children: N/A  . Years of Education: N/A   Social History Main Topics  . Smoking status: Never Smoker   . Smokeless tobacco: Never Used  . Alcohol Use: No  . Drug Use: No  . Sexual Activity: Yes    Birth Control/ Protection: Surgical     Comment: hyst   Other Topics Concern  . None   Social History Narrative    Review of Systems: As mentioned in HPI.   Physical Exam: BP 148/84 mmHg  Pulse 66  Temp(Src) 98.3 F (36.8 C) (Oral)  Ht 5' 1"  (1.549 m)  Wt 170 lb 3.2 oz (77.202 kg)  BMI 32.18 kg/m2 General:   Alert and oriented. No distress noted. Pleasant and cooperative.  Head:  Normocephalic and atraumatic. Eyes:  Conjuctiva clear without scleral icterus. Heart:  S1, S2 present without murmurs, rubs, or gallops. Regular rate and rhythm. Abdomen:  +BS, soft, non-tender and non-distended. No  rebound or guarding. No HSM or masses noted. Msk:  Symmetrical without gross deformities. Normal posture. Extremities:  Without edema. Neurologic:  Alert and  oriented x4;  grossly normal neurologically. Psych:  Alert and cooperative. Normal mood and affect.

## 2015-07-03 NOTE — Assessment & Plan Note (Signed)
Biopsy-proven in 2000, elastography in July 2015 with Metavir score of F4 but fibrotest with little to no fibrosis. Likely very mild fibrosis and no stigmata of portal hypertension on prior EGD Aug 2015. Will follow with serial ultrasounds yearly and complete vaccinations as she is doing. Discussed at length dietary intake, exercise. Appears in the pre-contemplation stage and does not seem to have a motivation to actually change her habits currently, although she states she "wants to". Spent at least 20-25 minutes discussing different strategies. US abdomen in Nov 2017 to be scheduled.

## 2015-07-03 NOTE — Patient Instructions (Signed)
We have scheduled you for a routine colonoscopy with Dr. Gala Romney.  Let me know if you need anything!   Ultrasound will be due in November 2017.

## 2015-07-04 NOTE — Progress Notes (Signed)
CC'ED TO PCP 

## 2015-07-09 ENCOUNTER — Telehealth: Payer: Self-pay | Admitting: Gastroenterology

## 2015-07-09 NOTE — Telephone Encounter (Signed)
Pt on recall for 01/2016 per OV with Vicente Males 06/2015

## 2015-07-09 NOTE — Telephone Encounter (Signed)
RECALL FOR ULTRASOUND ABD

## 2015-07-11 ENCOUNTER — Encounter (HOSPITAL_COMMUNITY)
Admission: RE | Disposition: A | Discharge status: Home / Self Care | Payer: Self-pay | Source: Ambulatory Visit | Attending: Internal Medicine

## 2015-07-11 ENCOUNTER — Ambulatory Visit (HOSPITAL_COMMUNITY)
Admission: RE | Admit: 2015-07-11 | Discharge: 2015-07-11 | Disposition: A | Discharge status: Home / Self Care | Payer: BLUE CROSS/BLUE SHIELD | Source: Ambulatory Visit | Attending: Internal Medicine | Admitting: Internal Medicine

## 2015-07-11 ENCOUNTER — Encounter (HOSPITAL_COMMUNITY): Payer: Self-pay | Admitting: *Deleted

## 2015-07-11 DIAGNOSIS — Z86010 Personal history of colonic polyps: Secondary | ICD-10-CM | POA: Diagnosis not present

## 2015-07-11 DIAGNOSIS — K641 Second degree hemorrhoids: Secondary | ICD-10-CM | POA: Insufficient documentation

## 2015-07-11 DIAGNOSIS — Z1211 Encounter for screening for malignant neoplasm of colon: Secondary | ICD-10-CM | POA: Insufficient documentation

## 2015-07-11 DIAGNOSIS — Z79899 Other long term (current) drug therapy: Secondary | ICD-10-CM | POA: Insufficient documentation

## 2015-07-11 DIAGNOSIS — K635 Polyp of colon: Secondary | ICD-10-CM

## 2015-07-11 DIAGNOSIS — I10 Essential (primary) hypertension: Secondary | ICD-10-CM | POA: Diagnosis not present

## 2015-07-11 DIAGNOSIS — F419 Anxiety disorder, unspecified: Secondary | ICD-10-CM | POA: Diagnosis not present

## 2015-07-11 DIAGNOSIS — D124 Benign neoplasm of descending colon: Secondary | ICD-10-CM | POA: Diagnosis not present

## 2015-07-11 DIAGNOSIS — F329 Major depressive disorder, single episode, unspecified: Secondary | ICD-10-CM | POA: Insufficient documentation

## 2015-07-11 DIAGNOSIS — K219 Gastro-esophageal reflux disease without esophagitis: Secondary | ICD-10-CM | POA: Diagnosis not present

## 2015-07-11 HISTORY — PX: COLONOSCOPY: SHX5424

## 2015-07-11 SURGERY — COLONOSCOPY
Anesthesia: Moderate Sedation

## 2015-07-11 MED ORDER — MEPERIDINE HCL 100 MG/ML IJ SOLN
INTRAMUSCULAR | Status: DC | PRN
  Administered 2015-07-11 (×2): 50 mg via INTRAVENOUS

## 2015-07-11 MED ORDER — ONDANSETRON HCL 4 MG/2ML IJ SOLN
INTRAMUSCULAR | Status: DC | PRN
  Administered 2015-07-11: 4 mg via INTRAVENOUS

## 2015-07-11 MED ORDER — MIDAZOLAM HCL 5 MG/5ML IJ SOLN
INTRAMUSCULAR | Status: AC
  Filled 2015-07-11: qty 10

## 2015-07-11 MED ORDER — MEPERIDINE HCL 100 MG/ML IJ SOLN
INTRAMUSCULAR | Status: AC
  Filled 2015-07-11: qty 2

## 2015-07-11 MED ORDER — ONDANSETRON HCL 4 MG/2ML IJ SOLN
INTRAMUSCULAR | Status: AC
  Filled 2015-07-11: qty 2

## 2015-07-11 MED ORDER — STERILE WATER FOR IRRIGATION IR SOLN
Status: DC | PRN
  Administered 2015-07-11: 13:00:00

## 2015-07-11 MED ORDER — MIDAZOLAM HCL 5 MG/5ML IJ SOLN
INTRAMUSCULAR | Status: DC | PRN
  Administered 2015-07-11: 2 mg via INTRAVENOUS
  Administered 2015-07-11: 1 mg via INTRAVENOUS
  Administered 2015-07-11: 2 mg via INTRAVENOUS

## 2015-07-11 MED ORDER — SODIUM CHLORIDE 0.9 % IV SOLN
INTRAVENOUS | Status: DC
  Administered 2015-07-11: 11:00:00 via INTRAVENOUS

## 2015-07-11 NOTE — Op Note (Signed)
[facility] Patient Name: Loretta Schaefer Procedure Date: 07/11/2015 12:53 PM MRN: [id number] Date of Birth: 08-11-1970 Attending MD: Norvel Richards , MD CSN: [id number] Age: 45 Admit Type: Outpatient Procedure:                Colonoscopy Indications:              High risk colon cancer surveillance: Personal                            history of colonic polyps Providers:                Norvel Richards, MD, Gwenlyn Fudge, RN, Georgeann Oppenheim, Technician Referring MD:             Glenda Chroman Medicines:                Midazolam 5 mg IV, Meperidine 100 mg IV,                            Ondansetron 4 mg IV Complications:            No immediate complications. Estimated Blood Loss:     Estimated blood loss was minimal. Procedure:                Pre-Anesthesia Assessment:                           - Prior to the procedure, a History and Physical                            was performed, and patient medications and                            allergies were reviewed. The patient's tolerance of                            previous anesthesia was also reviewed. The risks                            and benefits of the procedure and the sedation                            options and risks were discussed with the patient.                            All questions were answered, and informed consent                            was obtained. Prior Anticoagulants: The patient has                            taken no previous anticoagulant or antiplatelet  agents. ASA Grade Assessment: III - A patient with                            severe systemic disease. After reviewing the risks                            and benefits, the patient was deemed in                            satisfactory condition to undergo the procedure.                           After obtaining informed consent, the colonoscope                            was passed  under direct vision. Throughout the                            procedure, the patient's blood pressure, pulse, and                            oxygen saturations were monitored continuously. The                            EC-[id number] ([id number]) scope was introduced through                            the anus and advanced to the the cecum, identified                            by appendiceal orifice and ileocecal valve. The                            ileocecal valve, appendiceal orifice, and rectum                            were photographed. The ileocecal valve, appendiceal                            orifice, and rectum were photographed. The quality                            of the bowel preparation was adequate. Scope In: 1:00:01 PM Scope Out: 1:12:04 PM Scope Withdrawal Time: 0 hours 6 minutes 26 seconds  Total Procedure Duration: 0 hours 12 minutes 3 seconds  Findings:      (1) 5 mm polyp was found in the descending colon. The polyp was       semi-pedunculated. The polyp was removed with a cold snare. Resection       and retrieval were complete. Estimated blood loss was minimal.      The perianal and digital rectal examinations were normal.      Internal hemorrhoids an anal papilla were found during retroflexion. The       hemorrhoids were Grade II (internal hemorrhoids that prolapse  but reduce       spontaneously). Impression:               - One 5 mm polyp in the descending colon, removed                            with a cold snare. Resected and retrieved.                           - Internal hemorrhoids.                           - Mucosa. Moderate Sedation:      Moderate (conscious) sedation was administered by the endoscopy nurse       and supervised by the endoscopist. The following parameters were       monitored: oxygen saturation, heart rate, blood pressure, respiratory       rate, EKG, adequacy of pulmonary ventilation, and response to care.       Total physician  intraservice time was 16 minutes. Recommendation:           - Patient has a contact number available for                            emergencies. The signs and symptoms of potential                            delayed complications were discussed with the                            patient. Return to normal activities tomorrow.                            Written discharge instructions were provided to the                            patient.                           - Continue present medications.                           - Advance diet as tolerated.                           - Await pathology results.                           - Repeat colonoscopy date to be determined after                            pending pathology results are reviewed for                            surveillance based on pathology results.                           - Return to  GI office in 3 months. Procedure Code(s):        --- Professional ---                           [phone], Colonoscopy, flexible; with removal of                            tumor(s), polyp(s), or other lesion(s) by snare                            technique                           99152, Moderate sedation services provided by the                            same physician or other qualified health care                            professional performing the diagnostic or                            therapeutic service that the sedation supports,                            requiring the presence of an independent trained                            observer to assist in the monitoring of the                            patient's level of consciousness and physiological                            status; initial 15 minutes of intraservice time,                            patient age 63 years or older Diagnosis Code(s):        --- Professional ---                           Z86.010, Personal history of colonic polyps                           D12.4, Benign  neoplasm of descending colon                           K63.89, Other specified diseases of intestine                           K64.1, Second degree hemorrhoids CPT copyright 2016 American Medical Association. All rights reserved. The codes documented in this report are preliminary and upon coder review may  be revised to meet current compliance requirements. Cristopher Estimable. Rourk, MD Norvel Richards, MD 07/11/2015 1:21:56 PM This report has been signed electronically. Number of  Addenda: 0

## 2015-07-11 NOTE — Interval H&P Note (Signed)
History and Physical Interval Note:  07/11/2015 12:52 PM  Loretta Schaefer  has presented today for surgery, with the diagnosis of history of colon polyps  The various methods of treatment have been discussed with the patient and family. After consideration of risks, benefits and other options for treatment, the patient has consented to  Procedure(s) with comments: COLONOSCOPY (N/A) - 1130 as a surgical intervention .  The patient's history has been reviewed, patient examined, no change in status, stable for surgery.  I have reviewed the patient's chart and labs.  Questions were answered to the patient's satisfaction.     Robert Rourk  No change. Surveillance colonoscopy performed.  The risks, benefits, limitations, alternatives and imponderables have been reviewed with the patient. Questions have been answered. All parties are agreeable.

## 2015-07-11 NOTE — H&P (View-Only) (Signed)
Referring Provider: Glenda Chroman., MD Primary Care Physician:  Glenda Chroman., MD  Primary GI: Dr. Gala Romney   Chief Complaint  Patient presents with  . Follow-up    HPI:   Loretta Schaefer is a 45 y.o. female presenting today with a history of biopsy-proven NASH (2000) and GERD. Mother and aunt both with NASH cirrhosis. Last elastography in July 2015 with Metavir score of F4. EGD Aug 2015 with small hiatal hernia and no evidence of portal hypertension. Last colonoscopy with hyperplastic rectal polyp in March 2012, due for surveillance colonoscopy now; mother with history of polyps. Fibrotest recently performed with very little to no fibrosis. Needs Hep A and B vaccination. US abdomen yearly recommended, will be due again in November this year.   Protonix helps with reflux symptoms. No dysphagia. No constipation, diarrhea, or rectal bleeding. Last round of Hep A/B vaccinations due in July 2017. Very frustrated with her weight. Knows what to do and how to do it but can't seem to get motivated.    Past Medical History  Diagnosis Date  . Steatohepatitis 2000    without cirrhosis  . HTN (hypertension)   . History of pyelonephritis 2003    right  . Anxiety   . Depression   . Hyperplastic colon polyp 06/25/10    colonoscopy by Dr. Gala Romney  . Condyloma   . Peri-menopause 02/06/2014    Past Surgical History  Procedure Laterality Date  . Cesarean section      x 2  . Panniculectomy  2005  . S/p hysterectomy  2005  . Esophagogastroduodenoscopy  05/2010    distal ERE, small hh  . Colonoscopy  05/2010    normal TI, hyperplastic rectal polyp, friable anal canal  . Esophagogastroduodenoscopy N/A 11/13/2013    KGM:WNUUV hiatal hernia; otherwise, negative EGD.Nostigmata of cirrhosis foundPatient may not have cirrhosis at  . Toe surgery      surgery on left big toe at joint    Current Outpatient Prescriptions  Medication Sig Dispense Refill  . Ibuprofen (MOTRIN PO) Take 200 mg by mouth as  needed.     . pantoprazole (PROTONIX) 40 MG tablet Take 1 tablet (40 mg total) by mouth daily. 30 tablet 11  . Red Yeast Rice 600 MG CAPS Take by mouth daily.    . vitamin E 400 UNIT capsule Take 400 Units by mouth daily.     No current facility-administered medications for this visit.    Allergies as of 07/03/2015 - Review Complete 07/03/2015  Allergen Reaction Noted  . Omnipaque [iohexol] Hives and Itching 06/02/2010    Family History  Problem Relation Age of Onset  . Colon polyps Mother     less than age 69  . Cirrhosis Mother     NASH, deceased  . Depression Mother   . Diabetes Mother   . Heart disease Mother   . Cirrhosis Maternal Aunt     NASH  . Bipolar disorder Sister   . Asthma Sister   . Depression Sister     bipolar  . COPD Sister   . Asthma Brother   . Diabetes Sister   . Hypertension Sister   . Other Sister     degenerative disc in back  . Supraventricular tachycardia Son   . Asthma Son   . Heart attack Father   . COPD Father   . Emphysema Father     Social History   Social History  . Marital Status: Married  Spouse Name: N/A  . Number of Children: N/A  . Years of Education: N/A   Social History Main Topics  . Smoking status: Never Smoker   . Smokeless tobacco: Never Used  . Alcohol Use: No  . Drug Use: No  . Sexual Activity: Yes    Birth Control/ Protection: Surgical     Comment: hyst   Other Topics Concern  . None   Social History Narrative    Review of Systems: As mentioned in HPI.   Physical Exam: BP 148/84 mmHg  Pulse 66  Temp(Src) 98.3 F (36.8 C) (Oral)  Ht 5' 1"  (1.549 m)  Wt 170 lb 3.2 oz (77.202 kg)  BMI 32.18 kg/m2 General:   Alert and oriented. No distress noted. Pleasant and cooperative.  Head:  Normocephalic and atraumatic. Eyes:  Conjuctiva clear without scleral icterus. Heart:  S1, S2 present without murmurs, rubs, or gallops. Regular rate and rhythm. Abdomen:  +BS, soft, non-tender and non-distended. No  rebound or guarding. No HSM or masses noted. Msk:  Symmetrical without gross deformities. Normal posture. Extremities:  Without edema. Neurologic:  Alert and  oriented x4;  grossly normal neurologically. Psych:  Alert and cooperative. Normal mood and affect.

## 2015-07-11 NOTE — Discharge Instructions (Signed)
Colonoscopy Discharge Instructions  Read the instructions outlined below and refer to this sheet in the next few weeks. These discharge instructions provide you with general information on caring for yourself after you leave the hospital. Your doctor may also give you specific instructions. While your treatment has been planned according to the most current medical practices available, unavoidable complications occasionally occur. If you have any problems or questions after discharge, call Dr. Gala Romney at [phone]. ACTIVITY  You may resume your regular activity, but move at a slower pace for the next 24 hours.   Take frequent rest periods for the next 24 hours.   Walking will help get rid of the air and reduce the bloated feeling in your belly (abdomen).   No driving for 24 hours (because of the medicine (anesthesia) used during the test).    Do not sign any important legal documents or operate any machinery for 24 hours (because of the anesthesia used during the test).  NUTRITION  Drink plenty of fluids.   You may resume your normal diet as instructed by your doctor.   Begin with a light meal and progress to your normal diet. Heavy or fried foods are harder to digest and may make you feel sick to your stomach (nauseated).   Avoid alcoholic beverages for 24 hours or as instructed.  MEDICATIONS  You may resume your normal medications unless your doctor tells you otherwise.  WHAT YOU CAN EXPECT TODAY  Some feelings of bloating in the abdomen.   Passage of more gas than usual.   Spotting of blood in your stool or on the toilet paper.  IF YOU HAD POLYPS REMOVED DURING THE COLONOSCOPY:  No aspirin products for 7 days or as instructed.   No alcohol for 7 days or as instructed.   Eat a soft diet for the next 24 hours.  FINDING OUT THE RESULTS OF YOUR TEST Not all test results are available during your visit. If your test results are not back during the visit, make an appointment  with your caregiver to find out the results. Do not assume everything is normal if you have not heard from your caregiver or the medical facility. It is important for you to follow up on all of your test results.  SEEK IMMEDIATE MEDICAL ATTENTION IF:  You have more than a spotting of blood in your stool.   Your belly is swollen (abdominal distention).   You are nauseated or vomiting.   You have a temperature over 101.   You have abdominal pain or discomfort that is severe or gets worse throughout the day.    Colon polyp information provided Further recommendations to follow pending review of pathology report   Colon Polyps Polyps are lumps of extra tissue growing inside the body. Polyps can grow in the large intestine (colon). Most colon polyps are noncancerous (benign). However, some colon polyps can become cancerous over time. Polyps that are larger than a pea may be harmful. To be safe, caregivers remove and test all polyps. CAUSES  Polyps form when mutations in the genes cause your cells to grow and divide even though no more tissue is needed. RISK FACTORS There are a number of risk factors that can increase your chances of getting colon polyps. They include:  Being older than 50 years.  Family history of colon polyps or colon cancer.  Long-term colon diseases, such as colitis or Crohn disease.  Being overweight.  Smoking.  Being inactive.  Drinking too much alcohol.  SYMPTOMS  Most small polyps do not cause symptoms. If symptoms are present, they may include:  Blood in the stool. The stool may look dark red or black.  Constipation or diarrhea that lasts longer than 1 week. DIAGNOSIS People often do not know they have polyps until their caregiver finds them during a regular checkup. Your caregiver can use 4 tests to check for polyps:  Digital rectal exam. The caregiver wears gloves and feels inside the rectum. This test would find polyps only in the rectum.  Barium  enema. The caregiver puts a liquid called barium into your rectum before taking X-rays of your colon. Barium makes your colon look white. Polyps are dark, so they are easy to see in the X-ray pictures.  Sigmoidoscopy. A thin, flexible tube (sigmoidoscope) is placed into your rectum. The sigmoidoscope has a light and tiny camera in it. The caregiver uses the sigmoidoscope to look at the last third of your colon.  Colonoscopy. This test is like sigmoidoscopy, but the caregiver looks at the entire colon. This is the most common method for finding and removing polyps. TREATMENT  Any polyps will be removed during a sigmoidoscopy or colonoscopy. The polyps are then tested for cancer. PREVENTION  To help lower your risk of getting more colon polyps:  Eat plenty of fruits and vegetables. Avoid eating fatty foods.  Do not smoke.  Avoid drinking alcohol.  Exercise every day.  Lose weight if recommended by your caregiver.  Eat plenty of calcium and folate. Foods that are rich in calcium include milk, cheese, and broccoli. Foods that are rich in folate include chickpeas, kidney beans, and spinach. HOME CARE INSTRUCTIONS Keep all follow-up appointments as directed by your caregiver. You may need periodic exams to check for polyps. SEEK MEDICAL CARE IF: You notice bleeding during a bowel movement.   This information is not intended to replace advice given to you by your health care provider. Make sure you discuss any questions you have with your health care provider.   Document Released: 12/11/2003 Document Revised: 04/06/2014 Document Reviewed: 05/26/2011 Elsevier Interactive Patient Education [employer].

## 2015-07-15 ENCOUNTER — Encounter: Payer: Self-pay | Admitting: Internal Medicine

## 2015-07-16 ENCOUNTER — Encounter (HOSPITAL_COMMUNITY): Payer: Self-pay | Admitting: Internal Medicine

## 2015-07-18 ENCOUNTER — Encounter (HOSPITAL_COMMUNITY): Payer: Self-pay | Admitting: Psychology

## 2015-07-18 ENCOUNTER — Ambulatory Visit (INDEPENDENT_AMBULATORY_CARE_PROVIDER_SITE_OTHER): Payer: BLUE CROSS/BLUE SHIELD | Admitting: Psychology

## 2015-07-18 DIAGNOSIS — F419 Anxiety disorder, unspecified: Secondary | ICD-10-CM | POA: Diagnosis not present

## 2015-07-18 NOTE — Progress Notes (Signed)
   PROGRESS NOTE   Patient:  Loretta Schaefer   DOB: 10-Jan-1971  MR Number: [id number]  Location: [facility] [address] [place] [address] Dept: [phone]  Start: 10 AM End: 11 AM  Provider/Observer:     Edgardo Roys PSYD  Chief Complaint:      Chief Complaint  Patient presents with  . Anxiety  . Depression  . Stress    Reason For Service:   The patient was referred because of persistent and increasing symptoms of depression. The patient reports that the first symptoms she experienced a depression happened after her sister died in 05-30-04. She reports that her mother then passed away in May 30, 2008 after the patient had taking care of her mother for the prior 2 years. She reports that her depression has worsened after this time. She was treated initially after her sister died for depression and she got better but then her depression again worsened. Her primary care physician referred her to our office.   Interventions Strategy:  Cognitive/behavioral psychotherapeutic interventions  Participation Level:   Active  Participation Quality:  Appropriate      Behavioral Observation:  Well Groomed, Alert, and Appropriate.   Current Psychosocial Factors: The patient reports that she has been worrying more about her weight.  She reports that as she has gained weight that she has become more self conscious of this and avoided being around others more.  Making her anxiety worse..  Content of Session:   Review current symptoms and continue to work on therapeutic interventions are in issues of recurrent depression and anxiety. We have also been working on sleep hygiene issues as well.  Current Status:   The patient reports that she had been doing better but more recently has been more woried and concerned about her weight.  We worked on this from both a Oncologist and psychological standpoint  today.  Patient Progress:   Overall the patient has been progressing well and continuing to meet target goals.  Target Goals:   Target goals are to decrease the overall intensity, frequency, and duration of depressive events and improved issues related to her anxiety and intrusive thoughts.  Last Reviewed:   07/18/2015  Addressed Today:    Today we worked on the cognitive skills of dealing with intrusive thoughts and anxiety as well as sleep hygiene issues.  Impression/Diagnosis:   The patient denies a long-standing history of mood swings are consistent and persistent episodes of of depression. She doesn't now is that she has become depressed both in 05-30-2004 and then again with the death of her mother in 05-30-08.   Diagnosis:    Axis I:  Anxiety      Axis II: No diagnosis     Janaa Acero R, PsyD 07/18/2015

## 2015-07-22 ENCOUNTER — Encounter (HOSPITAL_COMMUNITY): Payer: Self-pay | Admitting: Psychology

## 2015-07-22 NOTE — Progress Notes (Signed)
   PROGRESS NOTE   Patient:  Loretta Schaefer   DOB: November 16, 1970  MR Number: [id number]  Location: [facility] [address] [place] [address] Dept: [phone]  Start: 4 PM End: 5 PM  Provider/Observer:     Edgardo Roys PSYD  Chief Complaint:      Chief Complaint  Patient presents with  . Anxiety  . Stress    Reason For Service:   The patient was referred because of persistent and increasing symptoms of depression. The patient reports that the first symptoms she experienced a depression happened after her sister died in 2004-06-07. She reports that her mother then passed away in 2008-06-07 after the patient had taking care of her mother for the prior 2 years. She reports that her depression has worsened after this time. She was treated initially after her sister died for depression and she got better but then her depression again worsened. Her primary care physician referred her to our office.   Interventions Strategy:  Cognitive/behavioral psychotherapeutic interventions  Participation Level:   Active  Participation Quality:  Appropriate      Behavioral Observation:  Well Groomed, Alert, and Appropriate.   Current Psychosocial Factors: The patient reports that she has been able to go on vacation with her husband.  While she had trouble with driving (it makes her very scared) she was able to get there and have a good time.  She is worried that her daughter is still thinking about the patient taking care of the new baby.  Content of Session:   Review current symptoms and continue to work on therapeutic interventions are in issues of recurrent depression and anxiety. We have also been working on sleep hygiene issues as well.  Current Status:   The patient reports that she is doing much better.  Her husband has gottne a new job with [employer] and she has done will with her kids issues.  Patient  Progress:   Overall the patient has been progressing well and continuing to meet target goals.  Target Goals:   Target goals are to decrease the overall intensity, frequency, and duration of depressive events and improved issues related to her anxiety and intrusive thoughts.  Last Reviewed:    05/01/2015  Addressed Today:    Today we worked on the cognitive skills of dealing with intrusive thoughts and anxiety as well as sleep hygiene issues.  Impression/Diagnosis:   The patient denies a long-standing history of mood swings are consistent and persistent episodes of of depression. She doesn't now is that she has become depressed both in June 07, 2004 and then again with the death of her mother in 2008-06-07.   Diagnosis:    Axis I:  Anxiety      Axis II: No diagnosis     Khalia Gong R, PsyD 07/22/2015

## 2015-08-15 ENCOUNTER — Ambulatory Visit: Payer: Self-pay | Admitting: Gastroenterology

## 2015-08-22 ENCOUNTER — Ambulatory Visit (INDEPENDENT_AMBULATORY_CARE_PROVIDER_SITE_OTHER): Payer: BLUE CROSS/BLUE SHIELD | Admitting: Psychology

## 2015-08-22 DIAGNOSIS — F419 Anxiety disorder, unspecified: Secondary | ICD-10-CM

## 2015-08-23 ENCOUNTER — Encounter (HOSPITAL_COMMUNITY): Payer: Self-pay | Admitting: Psychology

## 2015-08-23 NOTE — Progress Notes (Signed)
   PROGRESS NOTE   Patient:  Loretta Schaefer   DOB: [birth year]  MR Number: [id number]  Location: [facility] [address] [place] [address] Dept: [phone]  Start: 10 AM End: 11 AM  Provider/Observer:     Edgardo Roys PSYD  Chief Complaint:      Chief Complaint  Patient presents with  . Anxiety    Reason For Service:   The patient was referred because of persistent and increasing symptoms of depression. The patient reports that the first symptoms she experienced a depression happened after her sister died in 2004-06-09. She reports that her mother then passed away in 06/09/08 after the patient had taking care of her mother for the prior 2 years. She reports that her depression has worsened after this time. She was treated initially after her sister died for depression and she got better but then her depression again worsened. Her primary care physician referred her to our office.   Interventions Strategy:  Cognitive/behavioral psychotherapeutic interventions  Participation Level:   Active  Participation Quality:  Appropriate      Behavioral Observation:  Well Groomed, Alert, and Appropriate.   Current Psychosocial Factors: The patient reports that she has been worrying more about her weight.  She reports that as she has gained weight that she has become more self conscious of this and avoided being around others more.  Making her anxiety worse..  Content of Session:   Review current symptoms and continue to work on therapeutic interventions are in issues of recurrent depression and anxiety. We have also been working on sleep hygiene issues as well.  Current Status:   The patient reports that she had been doing better but more recently has been more woried and concerned about her weight.  We worked on this from both a Oncologist and psychological standpoint today.  Patient  Progress:   Overall the patient has been progressing well and continuing to meet target goals.  Target Goals:   Target goals are to decrease the overall intensity, frequency, and duration of depressive events and improved issues related to her anxiety and intrusive thoughts.  Last Reviewed:   08/22/2015  Addressed Today:    Today we worked on the cognitive skills of dealing with intrusive thoughts and anxiety as well as sleep hygiene issues.  Impression/Diagnosis:   The patient denies a long-standing history of mood swings are consistent and persistent episodes of of depression. She doesn't now is that she has become depressed both in Jun 09, 2004 and then again with the death of her mother in 2008/06/09.   Diagnosis:    Axis I:  Anxiety      Axis II: No diagnosis     Johann Santone R, PsyD 08/23/2015

## 2015-09-02 ENCOUNTER — Other Ambulatory Visit: Payer: Self-pay | Admitting: Gastroenterology

## 2015-09-02 ENCOUNTER — Telehealth (HOSPITAL_COMMUNITY): Payer: Self-pay | Admitting: *Deleted

## 2015-09-04 ENCOUNTER — Encounter (HOSPITAL_COMMUNITY): Payer: Self-pay | Admitting: Psychology

## 2015-09-04 NOTE — Progress Notes (Signed)
   PROGRESS NOTE   Patient:  Loretta Schaefer   DOB: 11-30-1970  MR Number: [id number]  Location: [facility] [address] [place] [address] Dept: [phone]  Start: 4 PM End: 5 PM  Provider/Observer:     Edgardo Roys PSYD  Chief Complaint:      Chief Complaint  Patient presents with  . Anxiety    Reason For Service:   The patient was referred because of persistent and increasing symptoms of depression. The patient reports that the first symptoms she experienced a depression happened after her sister died in 2004/06/13. She reports that her mother then passed away in 2008/06/13 after the patient had taking care of her mother for the prior 2 years. She reports that her depression has worsened after this time. She was treated initially after her sister died for depression and she got better but then her depression again worsened. Her primary care physician referred her to our office.   Interventions Strategy:  Cognitive/behavioral psychotherapeutic interventions  Participation Level:   Active  Participation Quality:  Appropriate      Behavioral Observation:  Well Groomed, Alert, and Appropriate.   Current Psychosocial Factors: The patient reports that she has been able to go on vacation with her husband.  While she had trouble with driving (it makes her very scared) she was able to get there and have a good time.  She is worried that her daughter is still thinking about the patient taking care of the new baby.  Content of Session:   Review current symptoms and continue to work on therapeutic interventions are in issues of recurrent depression and anxiety. We have also been working on sleep hygiene issues as well.  Current Status:   The patient reports that she is doing much better.  Her husband has gottne a new job with [employer] and she has done will with her kids issues.  Patient  Progress:   Overall the patient has been progressing well and continuing to meet target goals.  Target Goals:   Target goals are to decrease the overall intensity, frequency, and duration of depressive events and improved issues related to her anxiety and intrusive thoughts.  Last Reviewed:   05/29/2015  Addressed Today:    Today we worked on the cognitive skills of dealing with intrusive thoughts and anxiety as well as sleep hygiene issues.  Impression/Diagnosis:   The patient denies a long-standing history of mood swings are consistent and persistent episodes of of depression. She doesn't now is that she has become depressed both in 2004/06/13 and then again with the death of her mother in Jun 13, 2008.   Diagnosis:    Axis I:  Generalized anxiety disorder      Axis II: No diagnosis     RODENBOUGH,JOHN R, PsyD 09/04/2015

## 2015-09-24 ENCOUNTER — Ambulatory Visit (HOSPITAL_COMMUNITY): Payer: Self-pay | Admitting: Psychology

## 2015-10-08 ENCOUNTER — Ambulatory Visit (INDEPENDENT_AMBULATORY_CARE_PROVIDER_SITE_OTHER): Payer: BLUE CROSS/BLUE SHIELD | Admitting: Psychology

## 2015-10-08 DIAGNOSIS — F411 Generalized anxiety disorder: Secondary | ICD-10-CM | POA: Diagnosis not present

## 2015-10-11 ENCOUNTER — Encounter (HOSPITAL_COMMUNITY): Payer: Self-pay | Admitting: Psychology

## 2015-10-11 NOTE — Progress Notes (Signed)
   PROGRESS NOTE   Patient:  Loretta Schaefer   DOB: 01/01/1971  MR Number: [id number]  Location: [facility] [address] [place] [address] Dept: [phone]  Start: 10 AM End: 11 AM  Provider/Observer:     Edgardo Roys PSYD  Chief Complaint:      Chief Complaint  Patient presents with  . Anxiety    Reason For Service:   The patient was referred because of persistent and increasing symptoms of depression. The patient reports that the first symptoms she experienced a depression happened after her sister died in 06-17-2004. She reports that her mother then passed away in 17-Jun-2008 after the patient had taking care of her mother for the prior 2 years. She reports that her depression has worsened after this time. She was treated initially after her sister died for depression and she got better but then her depression again worsened. Her primary care physician referred her to our office.   Interventions Strategy:  Cognitive/behavioral psychotherapeutic interventions  Participation Level:   Active  Participation Quality:  Appropriate      Behavioral Observation:  Well Groomed, Alert, and Appropriate.   Current Psychosocial Factors: The patient reports that she has been worrying more about her weight.  She reports that as she has gained weight that she has become more self conscious of this and avoided being around others more.  Making her anxiety worse..  Content of Session:   Review current symptoms and continue to work on therapeutic interventions are in issues of recurrent depression and anxiety. We have also been working on sleep hygiene issues as well.  Current Status:   The patient reports that she had been doing better but more recently has been more woried and concerned about her weight.  We worked on this from both a Oncologist and psychological standpoint today.  Patient  Progress:   Overall the patient has been progressing well and continuing to meet target goals.  Target Goals:   Target goals are to decrease the overall intensity, frequency, and duration of depressive events and improved issues related to her anxiety and intrusive thoughts.  Last Reviewed:   10/09/2015  Addressed Today:    Today we worked on the cognitive skills of dealing with intrusive thoughts and anxiety as well as sleep hygiene issues.  Impression/Diagnosis:   The patient denies a long-standing history of mood swings are consistent and persistent episodes of of depression. She doesn't now is that she has become depressed both in Jun 17, 2004 and then again with the death of her mother in 06-17-08.   Diagnosis:    Axis I:  Generalized anxiety disorder      Axis II: No diagnosis     RODENBOUGH,JOHN R, PsyD 10/11/2015

## 2015-11-05 ENCOUNTER — Ambulatory Visit (INDEPENDENT_AMBULATORY_CARE_PROVIDER_SITE_OTHER): Payer: BLUE CROSS/BLUE SHIELD | Admitting: Psychology

## 2015-11-27 ENCOUNTER — Telehealth: Payer: Self-pay | Admitting: Internal Medicine

## 2015-11-27 ENCOUNTER — Other Ambulatory Visit: Payer: Self-pay

## 2015-11-27 DIAGNOSIS — K7581 Nonalcoholic steatohepatitis (NASH): Secondary | ICD-10-CM

## 2015-11-27 NOTE — Telephone Encounter (Signed)
ULTRASOUND ABDOMEN NOV 2017

## 2015-11-27 NOTE — Telephone Encounter (Signed)
Order in. Letter mailed.

## 2015-12-12 ENCOUNTER — Ambulatory Visit (HOSPITAL_COMMUNITY): Payer: Self-pay | Admitting: Psychology

## 2016-01-07 ENCOUNTER — Ambulatory Visit (INDEPENDENT_AMBULATORY_CARE_PROVIDER_SITE_OTHER): Payer: BLUE CROSS/BLUE SHIELD | Admitting: Psychology

## 2016-01-07 DIAGNOSIS — F411 Generalized anxiety disorder: Secondary | ICD-10-CM | POA: Diagnosis not present

## 2016-01-08 ENCOUNTER — Telehealth: Payer: Self-pay | Admitting: Internal Medicine

## 2016-01-08 NOTE — Telephone Encounter (Signed)
Letter mailed

## 2016-01-08 NOTE — Telephone Encounter (Signed)
Recall for abd ultrasound

## 2016-01-10 ENCOUNTER — Encounter (HOSPITAL_COMMUNITY): Payer: Self-pay | Admitting: Psychology

## 2016-01-10 NOTE — Progress Notes (Signed)
   PROGRESS NOTE   Patient:  Loretta Schaefer   DOB: 19-Oct-1970  MR Number: [id number]  Location: [facility] [address] [place] [address] Dept: [phone]  Start: 10 AM End: 11 AM  Provider/Observer:     Edgardo Roys PSYD  Chief Complaint:      Chief Complaint  Patient presents with  . Anxiety  . Stress    Reason For Service:   The patient was referred because of persistent and increasing symptoms of depression. The patient reports that the first symptoms she experienced a depression happened after her sister died in May 29, 2004. She reports that her mother then passed away in May 29, 2008 after the patient had taking care of her mother for the prior 2 years. She reports that her depression has worsened after this time. She was treated initially after her sister died for depression and she got better but then her depression again worsened. Her primary care physician referred her to our office.   Interventions Strategy:  Cognitive/behavioral psychotherapeutic interventions  Participation Level:   Active  Participation Quality:  Appropriate      Behavioral Observation:  Well Groomed, Alert, and Appropriate.   Current Psychosocial Factors: The patient reports that she has been worrying more about her weight.  She reports that as she has gained weight that she has become more self conscious of this and avoided being around others more.  Making her anxiety worse..  Content of Session:   Review current symptoms and continue to work on therapeutic interventions are in issues of recurrent depression and anxiety. We have also been working on sleep hygiene issues as well.  Current Status:   The patient reports that she had been doing better but more recently has been more woried and concerned about her weight.  We worked on this from both a Oncologist and psychological standpoint today.  Patient  Progress:   Overall the patient has been progressing well and continuing to meet target goals.  Target Goals:   Target goals are to decrease the overall intensity, frequency, and duration of depressive events and improved issues related to her anxiety and intrusive thoughts.  Last Reviewed:   01/08/2016  Addressed Today:    Today we worked on the cognitive skills of dealing with intrusive thoughts and anxiety as well as sleep hygiene issues.  Impression/Diagnosis:   The patient denies a long-standing history of mood swings are consistent and persistent episodes of of depression. She doesn't now is that she has become depressed both in May 29, 2004 and then again with the death of her mother in 05-29-08.   Diagnosis:    Axis I:  1. Generalized anxiety disorder          Axis II: No diagnosis     Sheniece Ruggles R, PsyD 01/10/2016

## 2016-01-26 DIAGNOSIS — Z23 Encounter for immunization: Secondary | ICD-10-CM | POA: Diagnosis not present

## 2016-01-28 ENCOUNTER — Other Ambulatory Visit: Payer: Self-pay | Admitting: Obstetrics and Gynecology

## 2016-01-28 DIAGNOSIS — Z1231 Encounter for screening mammogram for malignant neoplasm of breast: Secondary | ICD-10-CM

## 2016-01-30 ENCOUNTER — Ambulatory Visit (INDEPENDENT_AMBULATORY_CARE_PROVIDER_SITE_OTHER): Payer: BLUE CROSS/BLUE SHIELD | Admitting: Psychology

## 2016-01-30 ENCOUNTER — Encounter (HOSPITAL_COMMUNITY): Payer: Self-pay | Admitting: Psychology

## 2016-01-30 DIAGNOSIS — F411 Generalized anxiety disorder: Secondary | ICD-10-CM

## 2016-01-30 NOTE — Progress Notes (Signed)
   PROGRESS NOTE   Patient:  Loretta Schaefer   DOB: 1971-02-07  MR Number: [id number]  Location: [facility] [address] [place] [address] Dept: [phone]  Start: 10 AM End: 11 AM  Provider/Observer:     Edgardo Roys PSYD  Chief Complaint:      Chief Complaint  Patient presents with  . Stress    Reason For Service:   The patient was referred because of persistent and increasing symptoms of depression. The patient reports that the first symptoms she experienced a depression happened after her sister died in June 23, 2004. She reports that her mother then passed away in 23-Jun-2008 after the patient had taking care of her mother for the prior 2 years. She reports that her depression has worsened after this time. She was treated initially after her sister died for depression and she got better but then her depression again worsened. Her primary care physician referred her to our office.   Interventions Strategy:  Cognitive/behavioral psychotherapeutic interventions  Participation Level:   Active  Participation Quality:  Appropriate      Behavioral Observation:  Well Groomed, Alert, and Appropriate.   Current Psychosocial Factors: The patient reports that she continues to be stressed by family issues.  Son has now started back his relationship with wife and patient is worried about how the wife will disrupt son's life.  Content of Session:   Review current symptoms and continue to work on therapeutic interventions are in issues of recurrent depression and anxiety. We have also been working on sleep hygiene issues as well.  Current Status:   The patient reports that she had been more worried and anxiouse lately..  Patient Progress:   Overall the patient has been progressing well and continuing to meet target goals.  Target Goals:   Target goals are to decrease the overall intensity, frequency, and  duration of depressive events and improved issues related to her anxiety and intrusive thoughts.  Last Reviewed:   01/30/2016  Addressed Today:    Today we worked on the cognitive skills of dealing with intrusive thoughts and anxiety as well as sleep hygiene issues.  Impression/Diagnosis:   The patient denies a long-standing history of mood swings are consistent and persistent episodes of of depression. She doesn't now is that she has become depressed both in 06-23-2004 and then again with the death of her mother in 06/23/08.   Diagnosis:    Axis I:  1. Generalized anxiety disorder          Axis II: No diagnosis     Lameisha Schuenemann R, PsyD 01/30/2016

## 2016-02-13 ENCOUNTER — Ambulatory Visit (HOSPITAL_COMMUNITY)
Admission: RE | Admit: 2016-02-13 | Discharge: 2016-02-13 | Disposition: A | Discharge status: Home / Self Care | Payer: BLUE CROSS/BLUE SHIELD | Source: Ambulatory Visit | Attending: Gastroenterology | Admitting: Gastroenterology

## 2016-02-13 DIAGNOSIS — K7581 Nonalcoholic steatohepatitis (NASH): Secondary | ICD-10-CM | POA: Diagnosis not present

## 2016-02-18 ENCOUNTER — Ambulatory Visit (INDEPENDENT_AMBULATORY_CARE_PROVIDER_SITE_OTHER): Payer: BLUE CROSS/BLUE SHIELD | Admitting: Psychology

## 2016-02-18 DIAGNOSIS — F411 Generalized anxiety disorder: Secondary | ICD-10-CM

## 2016-02-19 ENCOUNTER — Encounter (HOSPITAL_COMMUNITY): Payer: Self-pay | Admitting: Psychology

## 2016-02-19 NOTE — Progress Notes (Signed)
   PROGRESS NOTE   Patient:  Loretta Schaefer   DOB: 11-14-1970  MR Number: [id number]  Location: [facility] [address] [place] [address] Dept: [phone]  Start: 10 AM End: 11 AM  Provider/Observer:     Edgardo Roys PSYD  Chief Complaint:      Chief Complaint  Patient presents with  . Anxiety    Reason For Service:   The patient was referred because of persistent and increasing symptoms of depression. The patient reports that the first symptoms she experienced a depression happened after her sister died in 06/09/2004. She reports that her mother then passed away in June 09, 2008 after the patient had taking care of her mother for the prior 2 years. She reports that her depression has worsened after this time. She was treated initially after her sister died for depression and she got better but then her depression again worsened. Her primary care physician referred her to our office.   Interventions Strategy:  Cognitive/behavioral psychotherapeutic interventions  Participation Level:   Active  Participation Quality:  Appropriate      Behavioral Observation:  Well Groomed, Alert, and Appropriate.   Current Psychosocial Factors: The patient reports that she continues to be stressed by family issues.  Son has now started back his relationship with wife and patient is worried about how the wife will disrupt son's life.  Content of Session:   Review current symptoms and continue to work on therapeutic interventions are in issues of recurrent depression and anxiety. We have also been working on sleep hygiene issues as well.  Current Status:   The patient reports that she had been more worried and anxiouse lately..  Patient Progress:   Overall the patient has been progressing well and continuing to meet target goals.  Target Goals:   Target goals are to decrease the overall intensity, frequency, and  duration of depressive events and improved issues related to her anxiety and intrusive thoughts.  Last Reviewed:   02/18/2016  Addressed Today:    Today we worked on the cognitive skills of dealing with intrusive thoughts and anxiety as well as sleep hygiene issues.  Impression/Diagnosis:   The patient denies a long-standing history of mood swings are consistent and persistent episodes of of depression. She doesn't now is that she has become depressed both in June 09, 2004 and then again with the death of her mother in 06-09-2008.   Diagnosis:    Axis I:  1. Generalized anxiety disorder          Axis II: No diagnosis     RODENBOUGH,JOHN R, PsyD 02/19/2016

## 2016-02-25 NOTE — Progress Notes (Signed)
Fatty liver disease, with known diagnosis of NASH. No indication of cirrhosis with laboratory markers or stigmata of liver disease on EGD. Will repeat yearly.

## 2016-03-02 ENCOUNTER — Ambulatory Visit (HOSPITAL_COMMUNITY)
Admission: RE | Admit: 2016-03-02 | Discharge: 2016-03-02 | Disposition: A | Discharge status: Home / Self Care | Payer: BLUE CROSS/BLUE SHIELD | Source: Ambulatory Visit | Attending: Obstetrics and Gynecology | Admitting: Obstetrics and Gynecology

## 2016-03-02 DIAGNOSIS — Z1231 Encounter for screening mammogram for malignant neoplasm of breast: Secondary | ICD-10-CM | POA: Diagnosis not present

## 2016-03-04 ENCOUNTER — Other Ambulatory Visit: Payer: Self-pay | Admitting: Obstetrics and Gynecology

## 2016-03-04 DIAGNOSIS — R928 Other abnormal and inconclusive findings on diagnostic imaging of breast: Secondary | ICD-10-CM

## 2016-03-12 ENCOUNTER — Ambulatory Visit (INDEPENDENT_AMBULATORY_CARE_PROVIDER_SITE_OTHER): Payer: BLUE CROSS/BLUE SHIELD | Admitting: Psychology

## 2016-03-12 DIAGNOSIS — F411 Generalized anxiety disorder: Secondary | ICD-10-CM | POA: Diagnosis not present

## 2016-03-17 ENCOUNTER — Other Ambulatory Visit: Payer: Self-pay | Admitting: Obstetrics and Gynecology

## 2016-03-17 ENCOUNTER — Ambulatory Visit (HOSPITAL_COMMUNITY)
Admission: RE | Admit: 2016-03-17 | Discharge: 2016-03-17 | Disposition: A | Discharge status: Home / Self Care | Payer: BLUE CROSS/BLUE SHIELD | Source: Ambulatory Visit | Attending: Obstetrics and Gynecology | Admitting: Obstetrics and Gynecology

## 2016-03-17 DIAGNOSIS — R928 Other abnormal and inconclusive findings on diagnostic imaging of breast: Secondary | ICD-10-CM | POA: Diagnosis not present

## 2016-03-17 DIAGNOSIS — N6489 Other specified disorders of breast: Secondary | ICD-10-CM | POA: Insufficient documentation

## 2016-03-30 HISTORY — PX: BREAST BIOPSY: SHX20

## 2016-03-31 ENCOUNTER — Other Ambulatory Visit: Payer: Self-pay | Admitting: Obstetrics and Gynecology

## 2016-03-31 ENCOUNTER — Encounter (HOSPITAL_COMMUNITY): Payer: Self-pay

## 2016-03-31 ENCOUNTER — Ambulatory Visit (HOSPITAL_COMMUNITY)
Admission: RE | Admit: 2016-03-31 | Discharge: 2016-03-31 | Disposition: A | Discharge status: Home / Self Care | Payer: BLUE CROSS/BLUE SHIELD | Source: Ambulatory Visit | Attending: Obstetrics and Gynecology | Admitting: Obstetrics and Gynecology

## 2016-03-31 DIAGNOSIS — N6012 Diffuse cystic mastopathy of left breast: Secondary | ICD-10-CM | POA: Diagnosis not present

## 2016-03-31 DIAGNOSIS — R921 Mammographic calcification found on diagnostic imaging of breast: Secondary | ICD-10-CM | POA: Diagnosis not present

## 2016-03-31 DIAGNOSIS — N6320 Unspecified lump in the left breast, unspecified quadrant: Secondary | ICD-10-CM

## 2016-03-31 DIAGNOSIS — N6489 Other specified disorders of breast: Secondary | ICD-10-CM | POA: Diagnosis not present

## 2016-03-31 DIAGNOSIS — R928 Other abnormal and inconclusive findings on diagnostic imaging of breast: Secondary | ICD-10-CM | POA: Diagnosis not present

## 2016-03-31 MED ORDER — LIDOCAINE HCL (PF) 1 % IJ SOLN
INTRAMUSCULAR | Status: AC
  Filled 2016-03-31: qty 10

## 2016-03-31 MED ORDER — LIDOCAINE-EPINEPHRINE (PF) 1 %-1:200000 IJ SOLN
INTRAMUSCULAR | Status: AC
  Filled 2016-03-31: qty 30

## 2016-04-02 ENCOUNTER — Telehealth: Payer: Self-pay | Admitting: Obstetrics and Gynecology

## 2016-04-02 NOTE — Telephone Encounter (Signed)
Patient called stating someone called her about her biopsy from our office.  I informed patient that I could not find any record of anyone from our office calling her.  I advised her to call [facility] to see if it was someone from there. Patient stated she would.

## 2016-04-06 ENCOUNTER — Telehealth: Payer: Self-pay | Admitting: Adult Health

## 2016-04-06 NOTE — Telephone Encounter (Signed)
Let pt I know I knew about breast, and if I can help let me know

## 2016-04-07 DIAGNOSIS — R928 Other abnormal and inconclusive findings on diagnostic imaging of breast: Secondary | ICD-10-CM | POA: Diagnosis not present

## 2016-04-07 DIAGNOSIS — N6489 Other specified disorders of breast: Secondary | ICD-10-CM | POA: Diagnosis not present

## 2016-04-13 NOTE — Progress Notes (Signed)
   PROGRESS NOTE   Patient:  Loretta Schaefer   DOB: 1970-06-09  MR Number: [id number]  Location: [facility] [address] [place] [address] Dept: [phone]  Start: 10 AM End: 11 AM  Provider/Observer:     Edgardo Roys PSYD  Chief Complaint:      Chief Complaint  Patient presents with  . Anxiety    Reason For Service:   The patient was referred because of persistent and increasing symptoms of depression. The patient reports that the first symptoms she experienced a depression happened after her sister died in 06/16/2004. She reports that her mother then passed away in 2008/06/16 after the patient had taking care of her mother for the prior 2 years. She reports that her depression has worsened after this time. She was treated initially after her sister died for depression and she got better but then her depression again worsened. Her primary care physician referred her to our office.   Interventions Strategy:  Cognitive/behavioral psychotherapeutic interventions  Participation Level:   Active  Participation Quality:  Appropriate      Behavioral Observation:  Well Groomed, Alert, and Appropriate.   Current Psychosocial Factors: The patient reports that she continues to be stressed by family issues.  Son has now started back his relationship with wife and patient is worried about how the wife will disrupt son's life.  Content of Session:   Review current symptoms and continue to work on therapeutic interventions are in issues of recurrent depression and anxiety. We have also been working on sleep hygiene issues as well.  Current Status:   The patient reports that she had been more worried and anxiouse lately..  Patient Progress:   Overall the patient has been progressing well and continuing to meet target goals.  Target Goals:   Target goals are to decrease the overall intensity, frequency, and  duration of depressive events and improved issues related to her anxiety and intrusive thoughts.  Last Reviewed:   03/12/2016  Addressed Today:    Today we worked on the cognitive skills of dealing with intrusive thoughts and anxiety as well as sleep hygiene issues.  Impression/Diagnosis:   The patient denies a long-standing history of mood swings are consistent and persistent episodes of of depression. She doesn't now is that she has become depressed both in 06-16-04 and then again with the death of her mother in 06/16/08.   Diagnosis:    Axis I:  1. Generalized anxiety disorder          Axis II: No diagnosis     RODENBOUGH,JOHN R, PsyD 04/13/2016

## 2016-04-14 ENCOUNTER — Ambulatory Visit (INDEPENDENT_AMBULATORY_CARE_PROVIDER_SITE_OTHER): Payer: BLUE CROSS/BLUE SHIELD | Admitting: Psychology

## 2016-04-14 DIAGNOSIS — F411 Generalized anxiety disorder: Secondary | ICD-10-CM | POA: Diagnosis not present

## 2016-04-14 NOTE — Progress Notes (Signed)
   PROGRESS NOTE   Patient:  Loretta Schaefer   DOB: February 19, 1971  MR Number: [id number]  Location: [facility] [address] [place] [address] Dept: [phone]  Start: 10 AM End: 11 AM  Provider/Observer:     Edgardo Roys PSYD  Chief Complaint:      Chief Complaint  Patient presents with  . Anxiety    Reason For Service:   The patient was referred because of persistent and increasing symptoms of depression. The patient reports that the first symptoms she experienced a depression happened after her sister died in June 14, 2004. She reports that her mother then passed away in 06/14/2008 after the patient had taking care of her mother for the prior 2 years. She reports that her depression has worsened after this time. She was treated initially after her sister died for depression and she got better but then her depression again worsened. Her primary care physician referred her to our office.   Interventions Strategy:  Cognitive/behavioral psychotherapeutic interventions  Participation Level:   Active  Participation Quality:  Appropriate      Behavioral Observation:  Well Groomed, Alert, and Appropriate.   Current Psychosocial Factors: The patient reports that she continues to be stressed by family issues.  Son has now started back his relationship with wife and patient is worried about how the wife will disrupt son's life.  Content of Session:   Review current symptoms and continue to work on therapeutic interventions are in issues of recurrent depression and anxiety. We have also been working on sleep hygiene issues as well.  Current Status:   The patient reports that she had been more worried and anxiouse lately..  Patient Progress:   Overall the patient has been progressing well and continuing to meet target goals.  Target Goals:   Target goals are to decrease the overall intensity, frequency, and  duration of depressive events and improved issues related to her anxiety and intrusive thoughts.  Last Reviewed:   04/14/2016  Addressed Today:    Today we worked on the cognitive skills of dealing with intrusive thoughts and anxiety as well as sleep hygiene issues.  Impression/Diagnosis:   The patient denies a long-standing history of mood swings are consistent and persistent episodes of of depression. She doesn't now is that she has become depressed both in 06/14/2004 and then again with the death of her mother in 14-Jun-2008.   Diagnosis:    Axis I:  1. Generalized anxiety disorder          Axis II: No diagnosis     Lya Holben R, PsyD 04/14/2016

## 2016-05-29 ENCOUNTER — Encounter: Payer: Self-pay | Admitting: Adult Health

## 2016-05-29 ENCOUNTER — Telehealth: Payer: Self-pay | Admitting: Adult Health

## 2016-05-29 ENCOUNTER — Ambulatory Visit (INDEPENDENT_AMBULATORY_CARE_PROVIDER_SITE_OTHER): Payer: BLUE CROSS/BLUE SHIELD | Admitting: Adult Health

## 2016-05-29 VITALS — BP 130/80 | HR 70 | Ht 60.5 in | Wt 171.0 lb

## 2016-05-29 DIAGNOSIS — N951 Menopausal and female climacteric states: Secondary | ICD-10-CM

## 2016-05-29 DIAGNOSIS — Z6832 Body mass index (BMI) 32.0-32.9, adult: Secondary | ICD-10-CM

## 2016-05-29 DIAGNOSIS — Z1211 Encounter for screening for malignant neoplasm of colon: Secondary | ICD-10-CM | POA: Diagnosis not present

## 2016-05-29 DIAGNOSIS — Z01411 Encounter for gynecological examination (general) (routine) with abnormal findings: Secondary | ICD-10-CM | POA: Diagnosis not present

## 2016-05-29 DIAGNOSIS — Z713 Dietary counseling and surveillance: Secondary | ICD-10-CM

## 2016-05-29 DIAGNOSIS — Z1212 Encounter for screening for malignant neoplasm of rectum: Secondary | ICD-10-CM

## 2016-05-29 DIAGNOSIS — N6012 Diffuse cystic mastopathy of left breast: Secondary | ICD-10-CM | POA: Diagnosis not present

## 2016-05-29 DIAGNOSIS — Z01419 Encounter for gynecological examination (general) (routine) without abnormal findings: Secondary | ICD-10-CM

## 2016-05-29 HISTORY — DX: Diffuse cystic mastopathy of left breast: N60.12

## 2016-05-29 LAB — HEMOCCULT GUIAC POC 1CARD (OFFICE): Fecal Occult Blood, POC: NEGATIVE

## 2016-05-29 MED ORDER — NALTREXONE-BUPROPION HCL ER 8-90 MG PO TB12: ORAL_TABLET | ORAL | 1 refills | Status: DC

## 2016-05-29 NOTE — Progress Notes (Signed)
Patient ID: Loretta Schaefer, female   DOB: [birth year], 46 y.o.   MRN: [id number] History of Present Illness:  Loretta Schaefer is a 46 year old white female, married in for a well woman gyn exam, she is spp hysterectomy.She had breast biopsy in January and had fibrocystic changes and saw Dr Rosana Hoes, he did not remove, and she does not  know when needs follow up. PCP is Dr Woody Seller, and sees Dr Gala Romney.   Current Medications, Allergies, Past Medical History, Past Surgical History, Family History and Social History were reviewed in [employer] record.     Review of Systems:  Patient denies any headaches, hearing loss, fatigue, blurred vision, shortness of breath, chest pain, abdominal pain, problems with bowel movements, urination, or intercourse. No joint pain or mood swings.Not losing weight, is going to weight watchers   Physical Exam:BP 130/80 (BP Location: Left Arm, Patient Position: Sitting, Cuff Size: Normal)   Pulse 70   Ht 5' 0.5" (1.537 m)   Wt 171 lb (77.6 kg)   BMI 32.85 kg/m  General:  Well developed, well nourished, no acute distress Skin:  Warm and dry, has rosacea on face  Neck:  Midline trachea, normal thyroid, good ROM, no lymphadenopathy Lungs; Clear to auscultation bilaterally Breast:  No dominant palpable mass, retraction, or nipple discharge Cardiovascular: Regular rate and rhythm Abdomen:  Soft, non tender, no hepatosplenomegaly Pelvic:  External genitalia is normal in appearance, no lesions.  The vagina is normal in appearance. Urethra has no lesions or masses. The cervix and uterus are absent. No adnexal masses or tenderness noted.Bladder is non tender, no masses felt. Rectal: Good sphincter tone, no polyps, or hemorrhoids felt.  Hemoccult negative. Extremities/musculoskeletal:  No swelling or varicosities noted, no clubbing or cyanosis Psych:  No mood changes, alert and cooperative,seems happy PHQ 2 score 1.Discussed contrave and she wants to try  it.  Impression: 1. Well woman exam with routine gynecological exam   2. Fibrocystic changes of left breast   3. Peri-menopause   4. Screening for colorectal cancer   5. Weight loss counseling, encounter for   6. Body mass index 32.0-32.9, adult       Plan:  Check CBC,CMP,TSH and lipids Meds ordered this encounter  Medications  . Naltrexone-Bupropion HCl ER (CONTRAVE) 8-90 MG TB12    Sig: Week 1: 1 in am,week 2:1 in am and 1 in pm, week 3: 2 in am, 1 in pm, week 4:2 in am , and 2 in pm    Dispense:  120 tablet    Refill:  1    Order Specific Question:   Supervising Provider    Answer:   Florian Buff [[id number]]  Follow up in 4 weeks Physical in 1 year Will get left diagnostic mammogram and [place] in April See dermatologist about face Review handout on Contrave

## 2016-05-30 LAB — COMPREHENSIVE METABOLIC PANEL
A/G RATIO: 1.6 (ref 1.2–2.2)
ALK PHOS: 55 IU/L (ref 39–117)
ALT: 49 IU/L — ABNORMAL HIGH (ref 0–32)
AST: 37 IU/L (ref 0–40)
Albumin: 4.7 g/dL (ref 3.5–5.5)
BUN/Creatinine Ratio: 12 (ref 9–23)
BUN: 10 mg/dL (ref 6–24)
Bilirubin Total: 0.7 mg/dL (ref 0.0–1.2)
CHLORIDE: 99 mmol/L (ref 96–106)
CO2: 22 mmol/L (ref 18–29)
Calcium: 9.6 mg/dL (ref 8.7–10.2)
Creatinine, Ser: 0.82 mg/dL (ref 0.57–1.00)
GFR calc Af Amer: 99 mL/min/{1.73_m2} (ref >59)
GFR calc non Af Amer: 85 mL/min/{1.73_m2} (ref >59)
GLOBULIN, TOTAL: 3 g/dL (ref 1.5–4.5)
Glucose: 83 mg/dL (ref 65–99)
POTASSIUM: 4.3 mmol/L (ref 3.5–5.2)
SODIUM: 140 mmol/L (ref 134–144)
Total Protein: 7.7 g/dL (ref 6.0–8.5)

## 2016-05-30 LAB — LIPID PANEL
CHOLESTEROL TOTAL: 239 mg/dL — AB (ref 100–199)
Chol/HDL Ratio: 5 ratio units — ABNORMAL HIGH (ref 0.0–4.4)
HDL: 48 mg/dL (ref >39)
LDL Calculated: 155 mg/dL — ABNORMAL HIGH (ref 0–99)
Triglycerides: 178 mg/dL — ABNORMAL HIGH (ref 0–149)
VLDL Cholesterol Cal: 36 mg/dL (ref 5–40)

## 2016-05-30 LAB — CBC
HEMATOCRIT: 44.2 % (ref 34.0–46.6)
Hemoglobin: 14.9 g/dL (ref 11.1–15.9)
MCH: 28.9 pg (ref 26.6–33.0)
MCHC: 33.7 g/dL (ref 31.5–35.7)
MCV: 86 fL (ref 79–97)
Platelets: 280 10*3/uL (ref 150–379)
RBC: 5.15 x10E6/uL (ref 3.77–5.28)
RDW: 14 % (ref 12.3–15.4)
WBC: 8.4 10*3/uL (ref 3.4–10.8)

## 2016-05-30 LAB — TSH: TSH: 2.2 u[IU]/mL (ref 0.450–4.500)

## 2016-07-03 ENCOUNTER — Ambulatory Visit (INDEPENDENT_AMBULATORY_CARE_PROVIDER_SITE_OTHER): Payer: BLUE CROSS/BLUE SHIELD | Admitting: Adult Health

## 2016-07-03 ENCOUNTER — Encounter: Payer: Self-pay | Admitting: Adult Health

## 2016-07-03 VITALS — BP 124/78 | HR 72 | Ht 60.5 in | Wt 170.0 lb

## 2016-07-03 DIAGNOSIS — Z713 Dietary counseling and surveillance: Secondary | ICD-10-CM | POA: Diagnosis not present

## 2016-07-03 DIAGNOSIS — N6012 Diffuse cystic mastopathy of left breast: Secondary | ICD-10-CM | POA: Diagnosis not present

## 2016-07-03 DIAGNOSIS — Z6832 Body mass index (BMI) 32.0-32.9, adult: Secondary | ICD-10-CM

## 2016-07-03 DIAGNOSIS — R928 Other abnormal and inconclusive findings on diagnostic imaging of breast: Secondary | ICD-10-CM

## 2016-07-03 DIAGNOSIS — Z6834 Body mass index (BMI) 34.0-34.9, adult: Secondary | ICD-10-CM | POA: Insufficient documentation

## 2016-07-03 NOTE — Progress Notes (Signed)
Subjective:     Patient ID: Loretta Schaefer, female   DOB: 12-Dec-1970, 46 y.o.   MRN: [id number]  HPI Loretta Schaefer is a 46 year old white female back in follow up of starting Contrave, has lost 1 lbs, but she says it feels like more and cravings are leaving but has had headache and nose runs when gets out of shower and bends over, no other time. It is time for F/U mammogram and [place] of left breast.  Review of Systems +headache +runny nose with bending over, when getting out of shower Reviewed past medical,surgical, social and family history. Reviewed medications and allergies.     Objective:   Physical Exam BP 124/78 (BP Location: Left Arm, Patient Position: Sitting, Cuff Size: Normal)   Pulse 72   Ht 5' 0.5" (1.537 m)   Wt 170 lb (77.1 kg)   BMI 32.65 kg/m  Skin warm and dry. Lungs: clear to ausculation bilaterally. Cardiovascular: regular rate and rhythm.   Headache should stop, but if does not let me know.  Assessment:     1. Fibrocystic changes of left breast   2. Abnormal mammogram of left breast   3. Body mass index 32.0-32.9, adult   4. Weight loss counseling, encounter for       Plan:     Continue Contrave Get left diagnostic mammogram and [place] 5/1 at 1:30 at [facility] Follow up prn

## 2016-07-28 ENCOUNTER — Telehealth: Payer: Self-pay | Admitting: Adult Health

## 2016-07-28 ENCOUNTER — Ambulatory Visit (HOSPITAL_COMMUNITY)
Admission: RE | Admit: 2016-07-28 | Discharge: 2016-07-28 | Disposition: A | Discharge status: Home / Self Care | Payer: BLUE CROSS/BLUE SHIELD | Source: Ambulatory Visit | Attending: Adult Health | Admitting: Adult Health

## 2016-07-28 DIAGNOSIS — R928 Other abnormal and inconclusive findings on diagnostic imaging of breast: Secondary | ICD-10-CM

## 2016-07-28 DIAGNOSIS — N6012 Diffuse cystic mastopathy of left breast: Secondary | ICD-10-CM | POA: Diagnosis not present

## 2016-07-28 NOTE — Telephone Encounter (Signed)
Pt aware that referral made to [facility] for excision of complex sclerosing lesion of breast

## 2016-08-21 DIAGNOSIS — N6489 Other specified disorders of breast: Secondary | ICD-10-CM | POA: Diagnosis not present

## 2016-09-07 ENCOUNTER — Other Ambulatory Visit: Payer: Self-pay | Admitting: Nurse Practitioner

## 2016-09-16 ENCOUNTER — Other Ambulatory Visit: Payer: Self-pay | Admitting: General Surgery

## 2016-09-16 DIAGNOSIS — N6489 Other specified disorders of breast: Secondary | ICD-10-CM

## 2016-10-01 NOTE — Pre-Procedure Instructions (Signed)
Loretta Schaefer  10/01/2016      [facility] - [place] [address], [place], [place] [address] [place] [address] Phone: [phone] Fax: [phone]    Your procedure is scheduled on Thurs. July 12  Report to [facility] Admitting at 6:40 A.M.  Call this number if you have problems the morning of surgery:  [phone]   Remember:  Do not eat food or drink liquids after midnight Wed. July 11              Drink Boost Breeze at 4:40 A.M.   Take these medicines the morning of surgery with A SIP OF WATER : pantoprazole (protonix)              1 week prior to surgery stop: advil, motrin, ibuprofen, aleve, BC Powders, Goody's, vitamins and herbal medicines.   Do not wear jewelry, make-up or nail polish.  Do not wear lotions, powders, or perfumes, or deoderant.  Do not shave 48 hours prior to surgery.  Men may shave face and neck.  Do not bring valuables to the hospital.  [facility] is not responsible for any belongings or valuables.  Contacts, dentures or bridgework may not be worn into surgery.  Leave your suitcase in the car.  After surgery it may be brought to your room.  For patients admitted to the hospital, discharge time will be determined by your treatment team.  Patients discharged the day of surgery will not be allowed to drive home.    Special instructions:  [place]- Preparing For Surgery  Before surgery, you can play an important role. Because skin is not sterile, your skin needs to be as free of germs as possible. You can reduce the number of germs on your skin by washing with CHG (chlorahexidine gluconate) Soap before surgery.  CHG is an antiseptic cleaner which kills germs and bonds with the skin to continue killing germs even after washing.  Please do not use if you have an allergy to CHG or antibacterial soaps. If your skin becomes reddened/irritated stop using the CHG.  Do not shave (including legs and underarms) for at  least 48 hours prior to first CHG shower. It is OK to shave your face.  Please follow these instructions carefully.   1. Shower the NIGHT BEFORE SURGERY and the MORNING OF SURGERY with CHG.   2. If you chose to wash your hair, wash your hair first as usual with your normal shampoo.  3. After you shampoo, rinse your hair and body thoroughly to remove the shampoo.  4. Use CHG as you would any other liquid soap. You can apply CHG directly to the skin and wash gently with a scrungie or a clean washcloth.   5. Apply the CHG Soap to your body ONLY FROM THE NECK DOWN.  Do not use on open wounds or open sores. Avoid contact with your eyes, ears, mouth and genitals (private parts). Wash genitals (private parts) with your normal soap.  6. Wash thoroughly, paying special attention to the area where your surgery will be performed.  7. Thoroughly rinse your body with warm water from the neck down.  8. DO NOT shower/wash with your normal soap after using and rinsing off the CHG Soap.  9. Pat yourself dry with a CLEAN TOWEL.   10. Wear CLEAN PAJAMAS   11. Place CLEAN SHEETS on your bed the night of your first shower and  DO NOT SLEEP WITH PETS.    Day of Surgery: Do not apply any deodorants/lotions. Please wear clean clothes to the hospital/surgery center.      Please read over the following fact sheets that you were given. Coughing and Deep Breathing and Surgical Site Infection Prevention

## 2016-10-02 ENCOUNTER — Encounter (HOSPITAL_COMMUNITY)
Admission: RE | Admit: 2016-10-02 | Discharge: 2016-10-02 | Disposition: A | Discharge status: Home / Self Care | Payer: BLUE CROSS/BLUE SHIELD | Source: Ambulatory Visit | Attending: General Surgery | Admitting: General Surgery

## 2016-10-02 ENCOUNTER — Encounter (HOSPITAL_COMMUNITY): Payer: Self-pay

## 2016-10-02 DIAGNOSIS — N6320 Unspecified lump in the left breast, unspecified quadrant: Secondary | ICD-10-CM | POA: Diagnosis not present

## 2016-10-02 DIAGNOSIS — Z01818 Encounter for other preprocedural examination: Secondary | ICD-10-CM | POA: Diagnosis not present

## 2016-10-02 DIAGNOSIS — I498 Other specified cardiac arrhythmias: Secondary | ICD-10-CM | POA: Diagnosis not present

## 2016-10-02 DIAGNOSIS — Z01812 Encounter for preprocedural laboratory examination: Secondary | ICD-10-CM | POA: Insufficient documentation

## 2016-10-02 HISTORY — DX: Essential (primary) hypertension: I10

## 2016-10-02 HISTORY — DX: Gastro-esophageal reflux disease without esophagitis: K21.9

## 2016-10-02 LAB — COMPREHENSIVE METABOLIC PANEL
ALT: 37 U/L (ref 14–54)
AST: 27 U/L (ref 15–41)
Albumin: 4.2 g/dL (ref 3.5–5.0)
Alkaline Phosphatase: 48 U/L (ref 38–126)
Anion gap: 8 (ref 5–15)
BUN: 9 mg/dL (ref 6–20)
CO2: 23 mmol/L (ref 22–32)
Calcium: 9.3 mg/dL (ref 8.9–10.3)
Chloride: 104 mmol/L (ref 101–111)
Creatinine, Ser: 0.87 mg/dL (ref 0.44–1.00)
GFR calc Af Amer: >60 mL/min (ref >60)
GFR calc non Af Amer: >60 mL/min (ref >60)
Glucose, Bld: 93 mg/dL (ref 65–99)
Potassium: 4 mmol/L (ref 3.5–5.1)
Sodium: 135 mmol/L (ref 135–145)
Total Bilirubin: 0.8 mg/dL (ref 0.3–1.2)
Total Protein: 7 g/dL (ref 6.5–8.1)

## 2016-10-02 LAB — CBC
HCT: 42 % (ref 36.0–46.0)
HEMOGLOBIN: 14.2 g/dL (ref 12.0–15.0)
MCH: 28.9 pg (ref 26.0–34.0)
MCHC: 33.8 g/dL (ref 30.0–36.0)
MCV: 85.5 fL (ref 78.0–100.0)
PLATELETS: 253 10*3/uL (ref 150–400)
RBC: 4.91 MIL/uL (ref 3.87–5.11)
RDW: 12.7 % (ref 11.5–15.5)
WBC: 8.2 10*3/uL (ref 4.0–10.5)

## 2016-10-02 NOTE — Progress Notes (Signed)
PCP: Dr. Woody Seller @ [facility]

## 2016-10-07 ENCOUNTER — Ambulatory Visit
Admission: RE | Admit: 2016-10-07 | Discharge: 2016-10-07 | Disposition: A | Discharge status: Home / Self Care | Payer: BLUE CROSS/BLUE SHIELD | Source: Ambulatory Visit | Attending: General Surgery | Admitting: General Surgery

## 2016-10-07 DIAGNOSIS — N6489 Other specified disorders of breast: Secondary | ICD-10-CM

## 2016-10-07 DIAGNOSIS — R928 Other abnormal and inconclusive findings on diagnostic imaging of breast: Secondary | ICD-10-CM | POA: Diagnosis not present

## 2016-10-07 NOTE — Anesthesia Preprocedure Evaluation (Addendum)
Anesthesia Evaluation  Patient identified by MRN, date of birth, ID band Patient awake    Reviewed: Allergy & Precautions, NPO status , Patient's Chart, lab work & pertinent test results  Airway Mallampati: II  TM Distance: >3 FB Neck ROM: Full    Dental no notable dental hx.    Pulmonary neg pulmonary ROS,    Pulmonary exam normal breath sounds clear to auscultation       Cardiovascular hypertension, negative cardio ROS Normal cardiovascular exam Rhythm:Regular Rate:Normal     Neuro/Psych PSYCHIATRIC DISORDERS Anxiety Depression negative neurological ROS  negative psych ROS   GI/Hepatic negative GI ROS, Neg liver ROS, GERD  Medicated,(+) Hepatitis -, Unspecified  Endo/Other  negative endocrine ROS  Renal/GU negative Renal ROS  negative genitourinary   Musculoskeletal negative musculoskeletal ROS (+)   Abdominal   Peds negative pediatric ROS (+)  Hematology negative hematology ROS (+)   Anesthesia Other Findings ON NALTREXONE  NASH (nonalcoholic steatohepatitis)  Reproductive/Obstetrics negative OB ROS                             Anesthesia Physical Anesthesia Plan  ASA: II  Anesthesia Plan: General   Post-op Pain Management:    Induction: Intravenous  PONV Risk Score and Plan: 2 and Ondansetron, Dexamethasone and Treatment may vary due to age or medical condition  Airway Management Planned: LMA  Additional Equipment:   Intra-op Plan:   Post-operative Plan:   Informed Consent:   Dental advisory given  Plan Discussed with: CRNA and Surgeon  Anesthesia Plan Comments: ( )        Anesthesia Quick Evaluation

## 2016-10-08 ENCOUNTER — Encounter (HOSPITAL_COMMUNITY)
Admission: RE | Disposition: A | Discharge status: Home / Self Care | Payer: Self-pay | Source: Ambulatory Visit | Attending: General Surgery

## 2016-10-08 ENCOUNTER — Ambulatory Visit (HOSPITAL_COMMUNITY): Payer: BLUE CROSS/BLUE SHIELD | Admitting: Anesthesiology

## 2016-10-08 ENCOUNTER — Encounter (HOSPITAL_COMMUNITY): Payer: Self-pay | Admitting: Certified Registered Nurse Anesthetist

## 2016-10-08 ENCOUNTER — Ambulatory Visit
Admission: RE | Admit: 2016-10-08 | Discharge: 2016-10-08 | Disposition: A | Discharge status: Home / Self Care | Payer: BLUE CROSS/BLUE SHIELD | Source: Ambulatory Visit | Attending: General Surgery | Admitting: General Surgery

## 2016-10-08 ENCOUNTER — Ambulatory Visit (HOSPITAL_COMMUNITY)
Admission: RE | Admit: 2016-10-08 | Discharge: 2016-10-08 | Disposition: A | Discharge status: Home / Self Care | Payer: BLUE CROSS/BLUE SHIELD | Source: Ambulatory Visit | Attending: General Surgery | Admitting: General Surgery

## 2016-10-08 DIAGNOSIS — E7800 Pure hypercholesterolemia, unspecified: Secondary | ICD-10-CM | POA: Insufficient documentation

## 2016-10-08 DIAGNOSIS — N6489 Other specified disorders of breast: Secondary | ICD-10-CM

## 2016-10-08 DIAGNOSIS — F419 Anxiety disorder, unspecified: Secondary | ICD-10-CM | POA: Insufficient documentation

## 2016-10-08 DIAGNOSIS — N6012 Diffuse cystic mastopathy of left breast: Secondary | ICD-10-CM | POA: Insufficient documentation

## 2016-10-08 DIAGNOSIS — K219 Gastro-esophageal reflux disease without esophagitis: Secondary | ICD-10-CM | POA: Diagnosis not present

## 2016-10-08 DIAGNOSIS — Z79899 Other long term (current) drug therapy: Secondary | ICD-10-CM | POA: Diagnosis not present

## 2016-10-08 DIAGNOSIS — Z888 Allergy status to other drugs, medicaments and biological substances status: Secondary | ICD-10-CM | POA: Diagnosis not present

## 2016-10-08 DIAGNOSIS — R928 Other abnormal and inconclusive findings on diagnostic imaging of breast: Secondary | ICD-10-CM | POA: Diagnosis not present

## 2016-10-08 DIAGNOSIS — N6320 Unspecified lump in the left breast, unspecified quadrant: Secondary | ICD-10-CM | POA: Diagnosis not present

## 2016-10-08 DIAGNOSIS — I10 Essential (primary) hypertension: Secondary | ICD-10-CM | POA: Insufficient documentation

## 2016-10-08 DIAGNOSIS — K7581 Nonalcoholic steatohepatitis (NASH): Secondary | ICD-10-CM | POA: Diagnosis not present

## 2016-10-08 DIAGNOSIS — N6092 Unspecified benign mammary dysplasia of left breast: Secondary | ICD-10-CM | POA: Diagnosis not present

## 2016-10-08 DIAGNOSIS — N6082 Other benign mammary dysplasias of left breast: Secondary | ICD-10-CM | POA: Diagnosis not present

## 2016-10-08 DIAGNOSIS — F418 Other specified anxiety disorders: Secondary | ICD-10-CM | POA: Diagnosis not present

## 2016-10-08 DIAGNOSIS — Z86010 Personal history of colonic polyps: Secondary | ICD-10-CM | POA: Diagnosis not present

## 2016-10-08 DIAGNOSIS — F329 Major depressive disorder, single episode, unspecified: Secondary | ICD-10-CM | POA: Diagnosis not present

## 2016-10-08 HISTORY — PX: RADIOACTIVE SEED GUIDED EXCISIONAL BREAST BIOPSY: SHX6490

## 2016-10-08 SURGERY — RADIOACTIVE SEED GUIDED BREAST BIOPSY
Anesthesia: General | Site: Breast | Laterality: Left

## 2016-10-08 MED ORDER — ACETAMINOPHEN 500 MG PO TABS
1000.0000 mg | ORAL_TABLET | ORAL | Status: AC
  Administered 2016-10-08: 1000 mg via ORAL
  Filled 2016-10-08: qty 2

## 2016-10-08 MED ORDER — ONDANSETRON HCL 4 MG/2ML IJ SOLN: 4.0000 mg | Freq: Once | INTRAMUSCULAR | Status: DC | PRN

## 2016-10-08 MED ORDER — CELECOXIB 200 MG PO CAPS
400.0000 mg | ORAL_CAPSULE | ORAL | Status: AC
  Administered 2016-10-08: 400 mg via ORAL
  Filled 2016-10-08: qty 2

## 2016-10-08 MED ORDER — 0.9 % SODIUM CHLORIDE (POUR BTL) OPTIME
TOPICAL | Status: DC | PRN
  Administered 2016-10-08: 1000 mL

## 2016-10-08 MED ORDER — DEXAMETHASONE SODIUM PHOSPHATE 10 MG/ML IJ SOLN
INTRAMUSCULAR | Status: DC | PRN
  Administered 2016-10-08: 10 mg via INTRAVENOUS

## 2016-10-08 MED ORDER — PROPOFOL 10 MG/ML IV BOLUS
INTRAVENOUS | Status: DC | PRN
  Administered 2016-10-08: 150 mg via INTRAVENOUS
  Administered 2016-10-08: 40 mg via INTRAVENOUS

## 2016-10-08 MED ORDER — BUPIVACAINE-EPINEPHRINE (PF) 0.25% -1:200000 IJ SOLN
INTRAMUSCULAR | Status: AC
  Filled 2016-10-08: qty 30

## 2016-10-08 MED ORDER — MIDAZOLAM HCL 2 MG/2ML IJ SOLN
INTRAMUSCULAR | Status: AC
  Filled 2016-10-08: qty 2

## 2016-10-08 MED ORDER — MIDAZOLAM HCL 5 MG/5ML IJ SOLN
INTRAMUSCULAR | Status: DC | PRN
  Administered 2016-10-08: 2 mg via INTRAVENOUS

## 2016-10-08 MED ORDER — BUPIVACAINE-EPINEPHRINE 0.25% -1:200000 IJ SOLN
INTRAMUSCULAR | Status: DC | PRN
  Administered 2016-10-08: 10 mL

## 2016-10-08 MED ORDER — FENTANYL CITRATE (PF) 100 MCG/2ML IJ SOLN: 25.0000 ug | INTRAMUSCULAR | Status: DC | PRN

## 2016-10-08 MED ORDER — GABAPENTIN 300 MG PO CAPS
300.0000 mg | ORAL_CAPSULE | ORAL | Status: AC
  Administered 2016-10-08: 300 mg via ORAL
  Filled 2016-10-08: qty 1

## 2016-10-08 MED ORDER — CEFAZOLIN SODIUM-DEXTROSE 2-4 GM/100ML-% IV SOLN
2.0000 g | INTRAVENOUS | Status: AC
  Administered 2016-10-08: 2 g via INTRAVENOUS
  Filled 2016-10-08: qty 100

## 2016-10-08 MED ORDER — PROPOFOL 10 MG/ML IV BOLUS
INTRAVENOUS | Status: AC
  Filled 2016-10-08: qty 40

## 2016-10-08 MED ORDER — ONDANSETRON HCL 4 MG/2ML IJ SOLN
INTRAMUSCULAR | Status: DC | PRN
  Administered 2016-10-08: 4 mg via INTRAVENOUS

## 2016-10-08 MED ORDER — DIPHENHYDRAMINE HCL 50 MG/ML IJ SOLN
INTRAMUSCULAR | Status: DC | PRN
  Administered 2016-10-08: 25 mg via INTRAVENOUS

## 2016-10-08 MED ORDER — FENTANYL CITRATE (PF) 100 MCG/2ML IJ SOLN
INTRAMUSCULAR | Status: DC | PRN
  Administered 2016-10-08: 50 ug via INTRAVENOUS

## 2016-10-08 MED ORDER — TRAMADOL HCL 50 MG PO TABS: 50.0000 mg | ORAL_TABLET | Freq: Four times a day (QID) | ORAL | 0 refills | Status: DC | PRN

## 2016-10-08 MED ORDER — FENTANYL CITRATE (PF) 250 MCG/5ML IJ SOLN
INTRAMUSCULAR | Status: AC
  Filled 2016-10-08: qty 5

## 2016-10-08 MED ORDER — MEPERIDINE HCL 25 MG/ML IJ SOLN: 6.2500 mg | INTRAMUSCULAR | Status: DC | PRN

## 2016-10-08 MED ORDER — LIDOCAINE HCL (CARDIAC) 20 MG/ML IV SOLN
INTRAVENOUS | Status: DC | PRN
  Administered 2016-10-08: 50 mg via INTRAVENOUS

## 2016-10-08 MED ORDER — LACTATED RINGERS IV SOLN
INTRAVENOUS | Status: DC | PRN
  Administered 2016-10-08: 07:00:00 via INTRAVENOUS

## 2016-10-08 SURGICAL SUPPLY — 50 items
ADH SKN CLS APL DERMABOND .7 (GAUZE/BANDAGES/DRESSINGS) ×1
APPLIER CLIP 9.375 MED OPEN (MISCELLANEOUS)
APR CLP MED 9.3 20 MLT OPN (MISCELLANEOUS)
BINDER BREAST LRG (GAUZE/BANDAGES/DRESSINGS) IMPLANT
BINDER BREAST XLRG (GAUZE/BANDAGES/DRESSINGS) ×1 IMPLANT
BLADE SURG 10 STRL SS (BLADE) ×2 IMPLANT
BLADE SURG 15 STRL LF DISP TIS (BLADE) ×1 IMPLANT
BLADE SURG 15 STRL SS (BLADE) ×2
CANISTER SUCT 3000ML PPV (MISCELLANEOUS) IMPLANT
CHLORAPREP W/TINT 26ML (MISCELLANEOUS) ×2 IMPLANT
CLIP APPLIE 9.375 MED OPEN (MISCELLANEOUS) IMPLANT
COVER PROBE W GEL 5X96 (DRAPES) ×2 IMPLANT
COVER SURGICAL LIGHT HANDLE (MISCELLANEOUS) ×2 IMPLANT
DERMABOND ADVANCED (GAUZE/BANDAGES/DRESSINGS) ×1
DERMABOND ADVANCED .7 DNX12 (GAUZE/BANDAGES/DRESSINGS) ×1 IMPLANT
DEVICE DUBIN SPECIMEN MAMMOGRA (MISCELLANEOUS) ×2 IMPLANT
DRAPE CHEST BREAST 15X10 FENES (DRAPES) ×2 IMPLANT
DRAPE UTILITY XL STRL (DRAPES) ×2 IMPLANT
ELECT COATED BLADE 2.86 ST (ELECTRODE) ×2 IMPLANT
ELECT REM PT RETURN 9FT ADLT (ELECTROSURGICAL) ×2
ELECTRODE REM PT RTRN 9FT ADLT (ELECTROSURGICAL) ×1 IMPLANT
GLOVE BIO SURGEON STRL SZ7 (GLOVE) ×2 IMPLANT
GLOVE BIOGEL PI IND STRL 7.5 (GLOVE) ×1 IMPLANT
GLOVE BIOGEL PI INDICATOR 7.5 (GLOVE) ×1
GOWN STRL REUS W/ TWL LRG LVL3 (GOWN DISPOSABLE) ×2 IMPLANT
GOWN STRL REUS W/TWL LRG LVL3 (GOWN DISPOSABLE) ×4
HEMOSTAT ARISTA ABSORB 3G PWDR (MISCELLANEOUS) IMPLANT
ILLUMINATOR WAVEGUIDE N/F (MISCELLANEOUS) IMPLANT
KIT BASIN OR (CUSTOM PROCEDURE TRAY) ×2 IMPLANT
KIT MARKER MARGIN INK (KITS) ×2 IMPLANT
LIGHT WAVEGUIDE WIDE FLAT (MISCELLANEOUS) IMPLANT
MARKER SKIN DUAL TIP RULER LAB (MISCELLANEOUS) ×2 IMPLANT
NEEDLE HYPO 25X1 1.5 SAFETY (NEEDLE) ×2 IMPLANT
NS IRRIG 1000ML POUR BTL (IV SOLUTION) IMPLANT
PACK SURGICAL SETUP 50X90 (CUSTOM PROCEDURE TRAY) ×2 IMPLANT
PENCIL BUTTON HOLSTER BLD 10FT (ELECTRODE) ×2 IMPLANT
SPONGE LAP 18X18 X RAY DECT (DISPOSABLE) ×2 IMPLANT
STRIP CLOSURE SKIN 1/2X4 (GAUZE/BANDAGES/DRESSINGS) ×2 IMPLANT
SUT MNCRL AB 4-0 PS2 18 (SUTURE) ×2 IMPLANT
SUT SILK 2 0 SH (SUTURE) IMPLANT
SUT VIC AB 2-0 SH 27 (SUTURE) ×2
SUT VIC AB 2-0 SH 27XBRD (SUTURE) ×1 IMPLANT
SUT VIC AB 3-0 SH 27 (SUTURE) ×2
SUT VIC AB 3-0 SH 27X BRD (SUTURE) ×1 IMPLANT
SYR BULB 3OZ (MISCELLANEOUS) ×2 IMPLANT
SYR CONTROL 10ML LL (SYRINGE) ×2 IMPLANT
TOWEL OR 17X24 6PK STRL BLUE (TOWEL DISPOSABLE) ×2 IMPLANT
TOWEL OR 17X26 10 PK STRL BLUE (TOWEL DISPOSABLE) ×2 IMPLANT
TUBE CONNECTING 12X1/4 (SUCTIONS) ×1 IMPLANT
YANKAUER SUCT BULB TIP NO VENT (SUCTIONS) IMPLANT

## 2016-10-08 NOTE — Interval H&P Note (Signed)
History and Physical Interval Note:  10/08/2016 8:40 AM  Loretta Schaefer  has presented today for surgery, with the diagnosis of LEFT BREAST MASS  The various methods of treatment have been discussed with the patient and family. After consideration of risks, benefits and other options for treatment, the patient has consented to  Procedure(s): RADIOACTIVE SEED GUIDED EXCISIONAL LEFT  BREAST BIOPSY (Left) as a surgical intervention .  The patient's history has been reviewed, patient examined, no change in status, stable for surgery.  I have reviewed the patient's chart and labs.  Questions were answered to the patient's satisfaction.     Loretta Schaefer

## 2016-10-08 NOTE — Op Note (Signed)
Preoperative diagnoses: Left breast mammographic abnormality Postoperative diagnosis: Same as above Procedure: Leftbreast seed guided excisional biopsy Surgeon: Dr. Serita Grammes Anesthesia: Gen. Estimated blood loss: minimal Complications: None Drains: None Specimens: Left breast tissue marked with paint Sponge and needle count correct at completion Disposition to recovery stable  Indications: This is a 68 yof with left breast mammographic abnormality who has undergone biopsy and is benign.  Due to appearance radiology recommended excision. A seed was placed and I had the mammograms in the operating room.  Procedure: After informed consent was obtained she was then taken to the operating room. She was given cefazolin. Sequential compression devices were on her legs. She was placed under general anesthesia without complication. Her chestwas then prepped and draped in the standard sterile surgical fashion. A surgical timeout was then performed. I located the radioactive seed with the neoprobe.I infiltrated marcaine.  I made an uoq scar as the seed was close to the skin I then used the neoprobe to guide the excision of the seed and surrounding tissue. This was directly under nipple. This was confirmed by the neoprobe. This was then taken for mammogram which confirmed removal of the seed and clip. This was confirmed by radiology. This was then sent to pathology. Hemostasis was observed.I closed the breast tissue with a 2-0 Vicryl. The dermis was closed with 3-0 Vicryl and the skin with 4-0 Monocryl.Dermabond and steristrips were placed on the incision. A breast binder was placed. She was transferred to recovery stable

## 2016-10-08 NOTE — Anesthesia Postprocedure Evaluation (Signed)
Anesthesia Post Note  Patient: Loretta Schaefer  Procedure(s) Performed: Procedure(s) (LRB): RADIOACTIVE SEED GUIDED EXCISIONAL LEFT  BREAST BIOPSY (Left)     Patient location during evaluation: PACU Anesthesia Type: General Level of consciousness: awake and alert Pain management: pain level controlled Vital Signs Assessment: post-procedure vital signs reviewed and stable Respiratory status: spontaneous breathing, nonlabored ventilation, respiratory function stable and patient connected to nasal cannula oxygen Cardiovascular status: blood pressure returned to baseline and stable Postop Assessment: no signs of nausea or vomiting Anesthetic complications: no    Last Vitals:  Vitals:   10/08/16 1020 10/08/16 1035  BP: 134/83 130/83  Pulse: 77 77  Resp: 16 16  Temp:  36.4 C    Last Pain:  Vitals:   10/08/16 0640  TempSrc: Oral                 [name]

## 2016-10-08 NOTE — Anesthesia Procedure Notes (Signed)
Procedure Name: LMA Insertion Date/Time: 10/08/2016 8:49 AM Performed by: Valda Favia Pre-anesthesia Checklist: Patient identified, Emergency Drugs available, Suction available, Patient being monitored and Timeout performed Patient Re-evaluated:Patient Re-evaluated prior to induction Oxygen Delivery Method: Circle system utilized Preoxygenation: Pre-oxygenation with 100% oxygen Induction Type: IV induction LMA: LMA inserted LMA Size: 4.0 Number of attempts: 1 Placement Confirmation: positive ETCO2 and breath sounds checked- equal and bilateral Tube secured with: Tape Dental Injury: Teeth and Oropharynx as per pre-operative assessment

## 2016-10-08 NOTE — Discharge Instructions (Signed)
[facility] Phone Number [phone]  POST OP INSTRUCTIONS  Always review your discharge instruction sheet given to you by the facility where your surgery was performed.  IF YOU HAVE DISABILITY OR FAMILY LEAVE FORMS, YOU MUST BRING THEM TO THE OFFICE FOR PROCESSING.  DO NOT GIVE THEM TO YOUR DOCTOR.  1. A prescription for pain medication may be given to you upon discharge.  Take your pain medication as prescribed, if needed.  If narcotic pain medicine is not needed, then you may take acetaminophen (Tylenol), naprosyn (Alleve) or ibuprofen (Advil) as needed. 2. Take your usually prescribed medications unless otherwise directed 3. If you need a refill on your pain medication, please contact your pharmacy.  They will contact our office to request authorization.  Prescriptions will not be filled after 5pm or on week-ends. 4. You should eat very light the first 24 hours after surgery, such as soup, crackers, pudding, etc.  Resume your normal diet the day after surgery. 5. Most patients will experience some swelling and bruising in the breast.  Ice packs and a good support bra will help.  Wear the breast binder provided or a sports bra for 72 hours day and night.  After that wear a sports bra during the day until you return to the office. Swelling and bruising can take several days to resolve.  6. It is common to experience some constipation if taking pain medication after surgery.  Increasing fluid intake and taking a stool softener will usually help or prevent this problem from occurring.  A mild laxative (Milk of Magnesia or Miralax) should be taken according to package directions if there are no bowel movements after 48 hours. 7. Unless discharge instructions indicate otherwise, you may remove your bandages 48 hours after surgery and you may shower at that time.  You may have steri-strips (small skin tapes) in place directly over the incision.  These strips should be left on the  skin for 7-10 days and will come off on their own.  If your surgeon used skin glue on the incision, you may shower in 24 hours.  The glue will flake off over the next 2-3 weeks.  Any sutures or staples will be removed at the office during your follow-up visit. 8. ACTIVITIES:  You may resume regular daily activities (gradually increasing) beginning the next day.  Wearing a good support bra or sports bra minimizes pain and swelling.  You may have sexual intercourse when it is comfortable. a. You may drive when you no longer are taking prescription pain medication, you can comfortably wear a seatbelt, and you can safely maneuver your car and apply brakes. b. RETURN TO WORK:  ______________________________________________________________________________________ 9. You should see your doctor in the office for a follow-up appointment approximately two weeks after your surgery.  Your doctors nurse will typically make your follow-up appointment when she calls you with your pathology report.  Expect your pathology report 3-4 business days after your surgery.  You may call to check if you do not hear from [place] after three days. 10. OTHER INSTRUCTIONS: _______________________________________________________________________________________________ _____________________________________________________________________________________________________________________________________ _____________________________________________________________________________________________________________________________________ _____________________________________________________________________________________________________________________________________  WHEN TO CALL DR Kaegan Stigler: 1. Fever over 101.0 2. Nausea and/or vomiting. 3. Extreme swelling or bruising. 4. Continued bleeding from incision. 5. Increased pain, redness, or drainage from the incision.  The clinic staff is available to answer your questions during regular  business hours.  Please dont hesitate to call and ask to speak to one of the nurses for clinical concerns.  If  you have a medical emergency, go to the nearest emergency room or call 911.  A surgeon from [facility] is always on call at the hospital.  For further questions, please visit [email or web address]

## 2016-10-08 NOTE — Transfer of Care (Signed)
Immediate Anesthesia Transfer of Care Note  Patient: Loretta Schaefer  Procedure(s) Performed: Procedure(s): RADIOACTIVE SEED GUIDED EXCISIONAL LEFT  BREAST BIOPSY (Left)  Patient Location: PACU  Anesthesia Type:General  Level of Consciousness: drowsy  Airway & Oxygen Therapy: Patient Spontanous Breathing and Patient connected to nasal cannula oxygen  Post-op Assessment: Report given to RN and Post -op Vital signs reviewed and stable  Post vital signs: Reviewed and stable  Last Vitals:  Vitals:   10/08/16 0640 10/08/16 0936  BP: (!) 143/73 (!) 149/84  Pulse: 81 (!) 101  Resp: 18 10  Temp: 36.7 C 36.6 C    Last Pain:  Vitals:   10/08/16 0640  TempSrc: Oral         Complications: No apparent anesthesia complications

## 2016-10-08 NOTE — H&P (Signed)
46 yof referred by Dr Woody Seller for left breast mass on mm. she has no family or personal history of breast cancer or ovarian cancer. she underwent screening mm in 12/17 that showed b density breasts. this should a possible asymmetry in the left breast. this was confirmed with spot compression views. US showed area of increased echogenicity with small cystic spaces. US guided biopsy was done. this pathology is reported as fcc, associated calcification and no atypia/malignancy. in the comment it states that the findings may be part of a complex sclerosing lesion. the clip is appropriately placed. she was recommended excision at that time and saw a surgeon at outside facility. she was recommended six month followup but wasnt clear why. she then underwent repeat mm in 5/18 that showed stability. she was recommended excision again. she is now referred for evaluation. she has no mass or dc. she is here with her husband today.  Past Surgical History  Breast Biopsy  Left. Cesarean Section - Multiple  Colon Polyp Removal - Colonoscopy  Foot Surgery  Left. Hysterectomy (not due to cancer) - Partial  Oral Surgery   Diagnostic Studies History  Colonoscopy  within last year Mammogram  within last year Pap Smear  1-5 years ago  Allergies  Iohexol *DIAGNOSTIC PRODUCTS*  Hives.  Medication History Pantoprazole Sodium (40MG Tablet DR, Oral) Active. Ibuprofen (200MG Tablet, Oral) Active. Vitamin E (400UNIT Capsule, Oral) Active. Red Yeast Rice (600MG Tablet, Oral) Active. Medications Reconciled  Social History M) Caffeine use  Coffee, Tea. No alcohol use  No drug use  Tobacco use  Never smoker.  Family History  Alcohol Abuse  Father. Arthritis  Father, Mother, Sister. Cerebrovascular Accident  Mother. Colon Polyps  Mother. Depression  Mother, Sister. Diabetes Mellitus  Mother, Sister. Heart Disease  Father, Mother. Hypertension  Brother, Father, Mother,  Sister. Respiratory Condition  Father, Mother, Sister.  Pregnancy / Birth History  Age at menarche  18 years. Contraceptive History  Oral contraceptives. Gravida  2 Irregular periods  Length (months) of breastfeeding  3-6 Maternal age  49-20 Para  2  Other ProblemsDepression  Gastroesophageal Reflux Disease  High blood pressure  Hypercholesterolemia   Review of Systems  General Present- Fatigue. Not Present- Appetite Loss, Chills, Fever, Night Sweats, Weight Gain and Weight Loss. Skin Present- Dryness. Not Present- Change in Wart/Mole, Hives, Jaundice, New Lesions, Non-Healing Wounds, Rash and Ulcer. HEENT Present- Seasonal Allergies and Wears glasses/contact lenses. Not Present- Earache, Hearing Loss, Hoarseness, Nose Bleed, Oral Ulcers, Ringing in the Ears, Sinus Pain, Sore Throat, Visual Disturbances and Yellow Eyes. Respiratory Not Present- Bloody sputum, Chronic Cough, Difficulty Breathing, Snoring and Wheezing. Breast Present- Breast Pain. Not Present- Breast Mass, Nipple Discharge and Skin Changes. Cardiovascular Not Present- Chest Pain, Difficulty Breathing Lying Down, Leg Cramps, Palpitations, Rapid Heart Rate, Shortness of Breath and Swelling of Extremities. Gastrointestinal Not Present- Abdominal Pain, Bloating, Bloody Stool, Change in Bowel Habits, Chronic diarrhea, Constipation, Difficulty Swallowing, Excessive gas, Gets full quickly at meals, Hemorrhoids, Indigestion, Nausea, Rectal Pain and Vomiting. Female Genitourinary Not Present- Frequency, Nocturia, Painful Urination, Pelvic Pain and Urgency. Musculoskeletal Not Present- Back Pain, Joint Pain, Joint Stiffness, Muscle Pain, Muscle Weakness and Swelling of Extremities. Neurological Present- Headaches. Not Present- Decreased Memory, Fainting, Numbness, Seizures, Tingling, Tremor, Trouble walking and Weakness. Psychiatric Present- Anxiety. Not Present- Bipolar, Change in Sleep Pattern, Depression, Fearful and  Frequent crying. Endocrine Not Present- Cold Intolerance, Excessive Hunger, Hair Changes, Heat Intolerance, Hot flashes and New Diabetes. Hematology Not Present- Blood Thinners,  Easy Bruising, Excessive bleeding, Gland problems, HIV and Persistent Infections.  Vitals  Weight: 169 lb Height: 60in Body Surface Area: 1.74 m Body Mass Index: 33.01 kg/m  Temp.: 98.34F  Pulse: 78 (Regular)  BP: 150/90 (Sitting, Left Arm, Standard)  Physical Exam  General Mental Status-Alert. Orientation-Oriented X3. Head and Neck Note: no thyromegaly Eye Sclera/Conjunctiva - Bilateral-No scleral icterus. Chest and Lung Exam Chest and lung exam reveals -quiet, even and easy respiratory effort with no use of accessory muscles and on auscultation, normal breath sounds, no adventitious sounds and normal vocal resonance. Breast Nipples-No Discharge. Breast Lump-No Palpable Breast Mass. Cardiovascular Cardiovascular examination reveals -normal heart sounds, regular rate and rhythm with no murmurs. Abdomen Note: soft nt Lymphatic Head & Neck General Head & Neck Lymphatics: Bilateral - Description - Normal. Axillary General Axillary Region: Bilateral - Description - Normal. Note: no [place] adenopathy  Assessment & Plan  RADIAL SCAR OF BREAST (M57.84) Story: Left breast radioactive seed guided excisional biopsy we discussed results of conversation with radiology and pathology. I still think this is unlikely to be anything more than benign but due to their recs will proceed with excision. this is what she would like also. discussed seed guided excision, surgery, recovery and risks. will proceed

## 2016-10-09 ENCOUNTER — Encounter (HOSPITAL_COMMUNITY): Payer: Self-pay | Admitting: General Surgery

## 2016-10-09 NOTE — Addendum Note (Signed)
Addendum  created 10/09/16 1306 by Janeece Riggers, MD   Sign clinical note

## 2016-10-15 ENCOUNTER — Encounter: Payer: Self-pay | Admitting: Adult Health

## 2016-12-31 ENCOUNTER — Telehealth: Payer: Self-pay | Admitting: Internal Medicine

## 2016-12-31 NOTE — Telephone Encounter (Signed)
RECALL FOR ULTRASOUND 

## 2016-12-31 NOTE — Telephone Encounter (Signed)
Letter mailed

## 2017-01-01 ENCOUNTER — Other Ambulatory Visit: Payer: Self-pay

## 2017-01-01 ENCOUNTER — Telehealth: Payer: Self-pay | Admitting: Internal Medicine

## 2017-01-01 DIAGNOSIS — K7581 Nonalcoholic steatohepatitis (NASH): Secondary | ICD-10-CM

## 2017-01-01 DIAGNOSIS — K760 Fatty (change of) liver, not elsewhere classified: Secondary | ICD-10-CM

## 2017-01-01 NOTE — Telephone Encounter (Signed)
[phone] patient received letter to schedule ultrasound.  Would prefer a Friday

## 2017-01-04 NOTE — Telephone Encounter (Signed)
US abdomen scheduled for 01/29/17 at 8:30am, pt to arrive at 8:15am. NPO after midnight before test. Tried to call pt to inform her of appt, no answer, LMVOM. Letter mailed.

## 2017-01-29 ENCOUNTER — Ambulatory Visit (HOSPITAL_COMMUNITY)
Admission: RE | Admit: 2017-01-29 | Discharge: 2017-01-29 | Disposition: A | Discharge status: Home / Self Care | Payer: BLUE CROSS/BLUE SHIELD | Source: Ambulatory Visit | Attending: Gastroenterology | Admitting: Gastroenterology

## 2017-01-29 DIAGNOSIS — K7581 Nonalcoholic steatohepatitis (NASH): Secondary | ICD-10-CM | POA: Diagnosis not present

## 2017-01-29 DIAGNOSIS — K760 Fatty (change of) liver, not elsewhere classified: Secondary | ICD-10-CM | POA: Diagnosis not present

## 2017-01-29 DIAGNOSIS — K7689 Other specified diseases of liver: Secondary | ICD-10-CM | POA: Diagnosis not present

## 2017-02-08 NOTE — Progress Notes (Signed)
Fatty liver. Repeat in 1 year.

## 2017-02-10 ENCOUNTER — Telehealth: Payer: Self-pay | Admitting: Internal Medicine

## 2017-02-10 NOTE — Telephone Encounter (Signed)
Results given.

## 2017-02-10 NOTE — Telephone Encounter (Signed)
Pt was returning a call to AM. Please call [phone]

## 2017-06-04 ENCOUNTER — Other Ambulatory Visit: Payer: BLUE CROSS/BLUE SHIELD | Admitting: Adult Health

## 2017-06-11 ENCOUNTER — Ambulatory Visit (INDEPENDENT_AMBULATORY_CARE_PROVIDER_SITE_OTHER): Payer: BLUE CROSS/BLUE SHIELD | Admitting: Adult Health

## 2017-06-11 ENCOUNTER — Encounter: Payer: Self-pay | Admitting: Adult Health

## 2017-06-11 ENCOUNTER — Other Ambulatory Visit: Payer: Self-pay | Admitting: Obstetrics and Gynecology

## 2017-06-11 ENCOUNTER — Other Ambulatory Visit: Payer: Self-pay

## 2017-06-11 VITALS — BP 136/80 | HR 92 | Resp 16 | Ht 60.5 in | Wt 182.0 lb

## 2017-06-11 DIAGNOSIS — Z1231 Encounter for screening mammogram for malignant neoplasm of breast: Secondary | ICD-10-CM

## 2017-06-11 DIAGNOSIS — Z1211 Encounter for screening for malignant neoplasm of colon: Secondary | ICD-10-CM | POA: Diagnosis not present

## 2017-06-11 DIAGNOSIS — F329 Major depressive disorder, single episode, unspecified: Secondary | ICD-10-CM | POA: Diagnosis not present

## 2017-06-11 DIAGNOSIS — F32A Depression, unspecified: Secondary | ICD-10-CM | POA: Insufficient documentation

## 2017-06-11 DIAGNOSIS — Z1212 Encounter for screening for malignant neoplasm of rectum: Secondary | ICD-10-CM | POA: Diagnosis not present

## 2017-06-11 DIAGNOSIS — Z713 Dietary counseling and surveillance: Secondary | ICD-10-CM

## 2017-06-11 DIAGNOSIS — Z01411 Encounter for gynecological examination (general) (routine) with abnormal findings: Secondary | ICD-10-CM | POA: Diagnosis not present

## 2017-06-11 DIAGNOSIS — Z6834 Body mass index (BMI) 34.0-34.9, adult: Secondary | ICD-10-CM

## 2017-06-11 DIAGNOSIS — Z01419 Encounter for gynecological examination (general) (routine) without abnormal findings: Secondary | ICD-10-CM

## 2017-06-11 LAB — HEMOCCULT GUIAC POC 1CARD (OFFICE): FECAL OCCULT BLD: NEGATIVE

## 2017-06-11 MED ORDER — NALTREXONE-BUPROPION HCL ER 8-90 MG PO TB12: ORAL_TABLET | ORAL | 1 refills | Status: DC

## 2017-06-11 NOTE — Progress Notes (Signed)
Patient ID: Loretta Schaefer, female   DOB: 01-07-1971, 47 y.o.   MRN: [id number] History of Present Illness: Loretta Schaefer is a 47 year old white female, married sp hysterectomy in for a well woman gyn exam. PCP is Dr Loretta Schaefer and she sees Dr Loretta Schaefer for fatty liver.  Current Medications, Allergies, Past Medical History, Past Surgical History, Family History and Social History were reviewed in [employer] record.     Review of Systems:  Patient denies any headaches, hearing loss, fatigue, blurred vision, shortness of breath, chest pain, abdominal pain, problems with bowel movements, urination, or intercourse. No joint pain or mood swings.+weight gain, feels bloated.Has breast surgery last year on left.  Physical Exam:BP 136/80 (BP Location: Right Arm, Patient Position: Sitting, Cuff Size: Normal)   Pulse 92   Resp 16   Ht 5' 0.5" (1.537 m)   Wt 182 lb (82.6 kg)   BMI 34.96 kg/m  General:  Well developed, well nourished, no acute distress Skin:  Warm and dry Neck:  Midline trachea, normal thyroid, good ROM, no lymphadenopathy Lungs; Clear to auscultation bilaterally Breast:  No dominant palpable mass, retraction, or nipple discharge,scar left breast at 2 o'clock Cardiovascular: Regular rate and rhythm Abdomen:  Soft, non tender, no hepatosplenomegaly Pelvic:  External genitalia is normal in appearance, no lesions.  The vagina is normal in appearance. Urethra has no lesions or masses. The cervix and uterus are absent. No adnexal masses or tenderness noted.Bladder is non tender, no masses felt. Rectal: Good sphincter tone, no polyps, or hemorrhoids felt.  Hemoccult negative. Extremities/musculoskeletal:  No swelling or varicosities noted, no clubbing or cyanosis Psych:  No mood changes, alert and cooperative,seems happy PHQ 9 score 7, denies being suicidal,given number for [facility] [place] in [place]. Has used Contrave in past, will restart Contrave, and it may help  with mood. Start trying to be more active and increase water, and decrease portion size.   Impression: 1. Well woman exam with routine gynecological exam   2. Screening for colorectal cancer   3. Weight loss counseling, encounter for   4. Body mass index 34.0-34.9, adult   5. Depression, unspecified depression type       Plan: Meds ordered this encounter  Medications  . Naltrexone-buPROPion HCl ER (CONTRAVE) 8-90 MG TB12    Sig: Week 1: 1 in am,week 2:1 in am and 1 in pm, week 3: 2 in am, 1 in pm, week 4:2 in am , and 2 in pm    Dispense:  120 tablet    Refill:  1    Order Specific Question:   Supervising Provider    Answer:   Loretta Schaefer [[id number]]  Get Mammogram F/U in 6 weeks Physical in 1 year Colonoscopy per GI

## 2017-06-17 ENCOUNTER — Telehealth: Payer: Self-pay | Admitting: Adult Health

## 2017-06-21 ENCOUNTER — Encounter: Payer: Self-pay | Admitting: Adult Health

## 2017-06-25 ENCOUNTER — Ambulatory Visit (HOSPITAL_COMMUNITY)
Admission: RE | Admit: 2017-06-25 | Discharge: 2017-06-25 | Disposition: A | Discharge status: Home / Self Care | Payer: BLUE CROSS/BLUE SHIELD | Source: Ambulatory Visit | Attending: Obstetrics and Gynecology | Admitting: Obstetrics and Gynecology

## 2017-06-25 DIAGNOSIS — Z1231 Encounter for screening mammogram for malignant neoplasm of breast: Secondary | ICD-10-CM | POA: Diagnosis not present

## 2017-06-28 ENCOUNTER — Encounter: Payer: Self-pay | Admitting: Obstetrics and Gynecology

## 2017-06-29 ENCOUNTER — Encounter: Payer: Self-pay | Admitting: Adult Health

## 2017-07-02 ENCOUNTER — Encounter: Payer: Self-pay | Admitting: Adult Health

## 2017-07-02 ENCOUNTER — Encounter: Payer: Self-pay | Admitting: *Deleted

## 2017-07-22 ENCOUNTER — Ambulatory Visit: Payer: BLUE CROSS/BLUE SHIELD | Admitting: Adult Health

## 2017-08-17 ENCOUNTER — Encounter: Payer: Self-pay | Admitting: Adult Health

## 2017-08-20 ENCOUNTER — Ambulatory Visit: Payer: BLUE CROSS/BLUE SHIELD | Admitting: Adult Health

## 2017-09-14 ENCOUNTER — Other Ambulatory Visit: Payer: Self-pay | Admitting: Gastroenterology

## 2017-11-09 DIAGNOSIS — Z1283 Encounter for screening for malignant neoplasm of skin: Secondary | ICD-10-CM | POA: Diagnosis not present

## 2017-11-09 DIAGNOSIS — L718 Other rosacea: Secondary | ICD-10-CM | POA: Diagnosis not present

## 2018-01-02 DIAGNOSIS — Z23 Encounter for immunization: Secondary | ICD-10-CM | POA: Diagnosis not present

## 2018-03-18 ENCOUNTER — Other Ambulatory Visit: Payer: Self-pay | Admitting: Gastroenterology

## 2018-03-31 ENCOUNTER — Other Ambulatory Visit: Payer: Self-pay

## 2018-03-31 MED ORDER — PANTOPRAZOLE SODIUM 40 MG PO TBEC: 40.0000 mg | DELAYED_RELEASE_TABLET | Freq: Every day | ORAL | 1 refills | Status: DC

## 2018-03-31 NOTE — Telephone Encounter (Signed)
We have not seen her since 2017. Limited refills.  She needs ov.

## 2018-04-04 ENCOUNTER — Encounter: Payer: Self-pay | Admitting: Internal Medicine

## 2018-04-04 NOTE — Telephone Encounter (Signed)
SCHEDULED AND LETTER SENT  °

## 2018-04-06 ENCOUNTER — Encounter: Payer: Self-pay | Admitting: Gastroenterology

## 2018-05-19 ENCOUNTER — Other Ambulatory Visit: Payer: Self-pay | Admitting: Gastroenterology

## 2018-05-31 ENCOUNTER — Other Ambulatory Visit (HOSPITAL_COMMUNITY): Payer: Self-pay | Admitting: Obstetrics and Gynecology

## 2018-05-31 DIAGNOSIS — Z1231 Encounter for screening mammogram for malignant neoplasm of breast: Secondary | ICD-10-CM

## 2018-06-16 ENCOUNTER — Telehealth: Payer: Self-pay | Admitting: Internal Medicine

## 2018-06-16 MED ORDER — PANTOPRAZOLE SODIUM 40 MG PO TBEC: 40.0000 mg | DELAYED_RELEASE_TABLET | Freq: Every day | ORAL | 3 refills | Status: DC

## 2018-06-16 NOTE — Telephone Encounter (Signed)
Pt notified of results

## 2018-06-16 NOTE — Telephone Encounter (Signed)
Routing message to AB. Please advise.

## 2018-06-16 NOTE — Telephone Encounter (Signed)
Pt had to be rescheduled and is aware of OV for 6/5. She was coming in to further her refills of pantoprazole. Can she get samples or a prescription called into [facility] to hold her until her OV in June? Please advise [phone]

## 2018-06-16 NOTE — Telephone Encounter (Signed)
I have sent in refills.

## 2018-06-22 ENCOUNTER — Ambulatory Visit: Payer: Self-pay | Admitting: Gastroenterology

## 2018-06-24 ENCOUNTER — Ambulatory Visit: Payer: BLUE CROSS/BLUE SHIELD | Admitting: Gastroenterology

## 2018-06-24 ENCOUNTER — Ambulatory Visit: Payer: Self-pay | Admitting: Gastroenterology

## 2018-07-01 ENCOUNTER — Ambulatory Visit (HOSPITAL_COMMUNITY): Payer: Self-pay

## 2018-07-04 IMAGING — MG NEEDLE LOCALIZATION OF THE LEFT BREAST WITH MAMMO GUIDANCE
5 series · 5 of 5 positions shown · non-contrast
Comparison: Previous exam(s).

CLINICAL DATA: Distortion in the outer left breast with pathology
results of a possible complex sclerosing lesion at ultrasound-guided
core needle biopsy on 03/31/2016. Pre surgical excision
localization.

EXAM:
MAMMOGRAPHIC GUIDED RADIOACTIVE SEED LOCALIZATION OF THE LEFT BREAST

[L LM (1 of 3)]
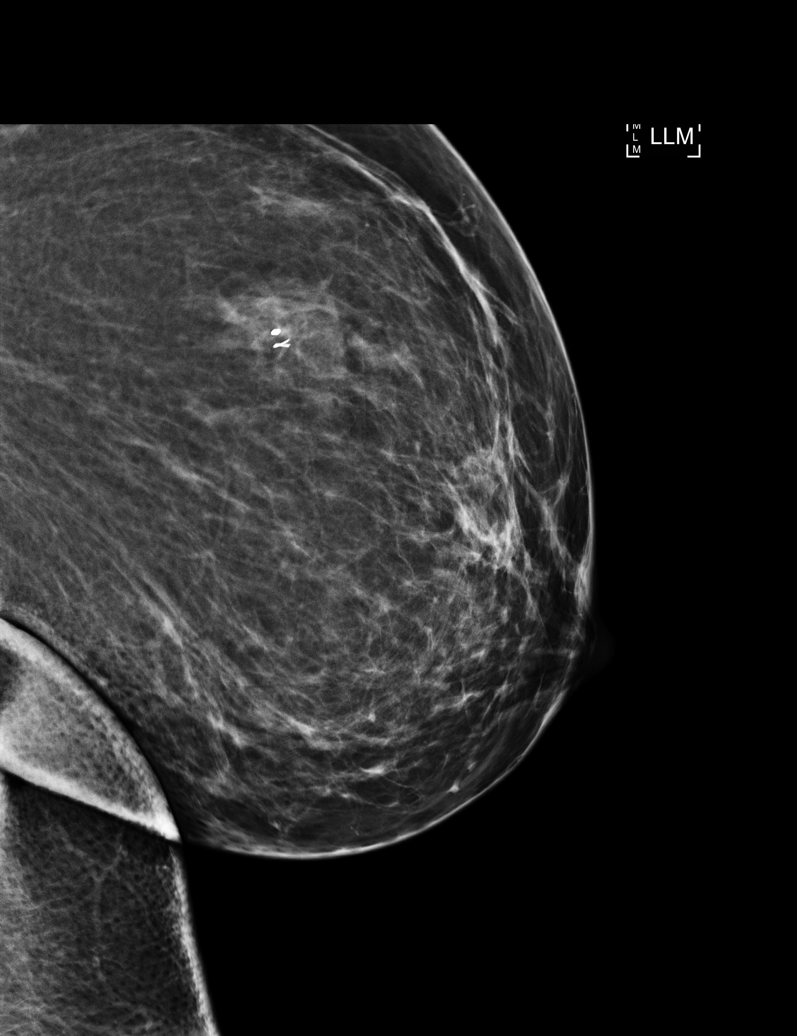

[L LM (2 of 3)]
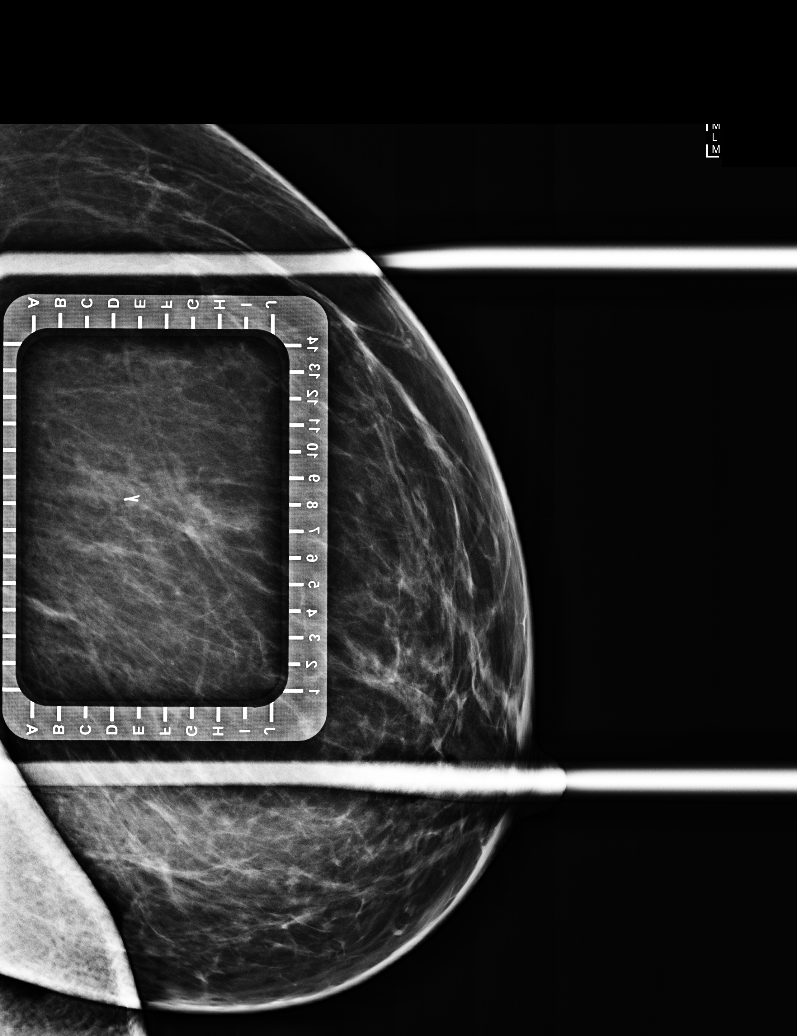

[L CC (1 of 2)]
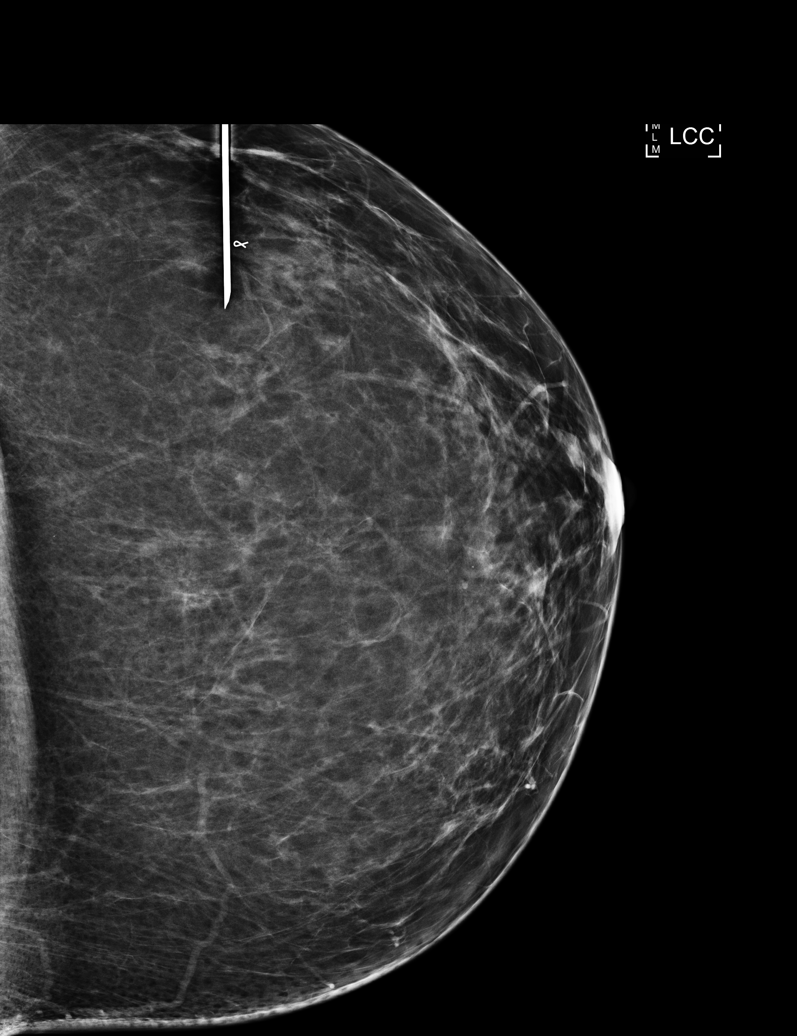

[L LM (3 of 3)]
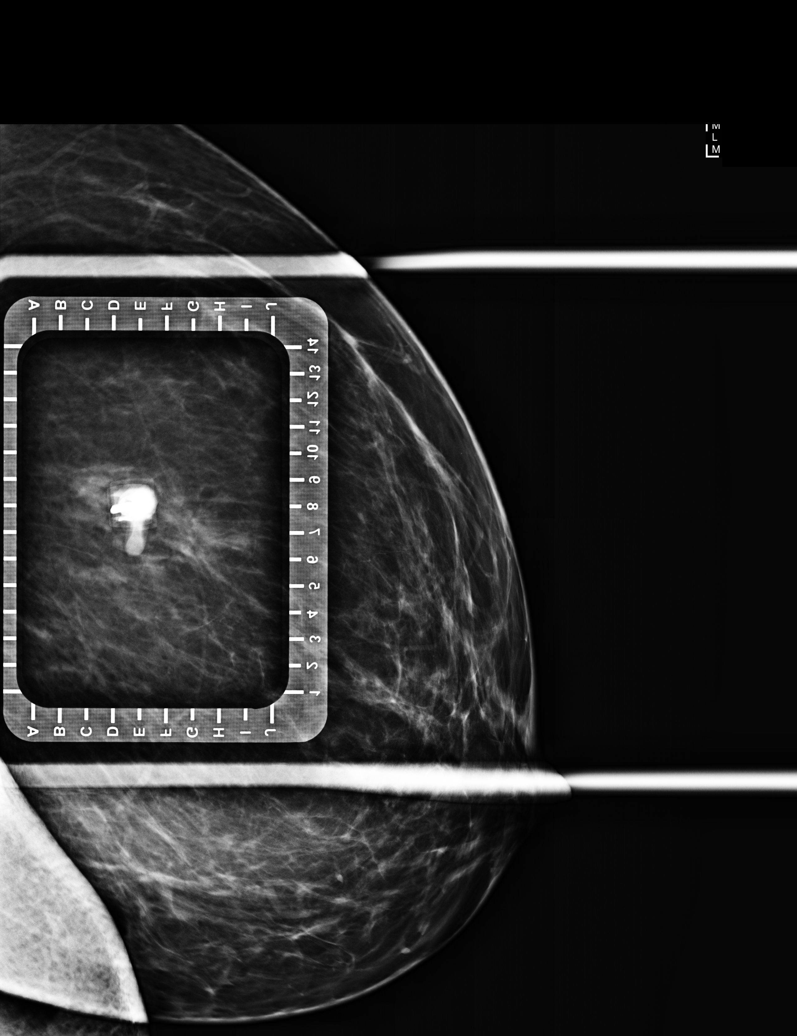

[L CC (2 of 2)]
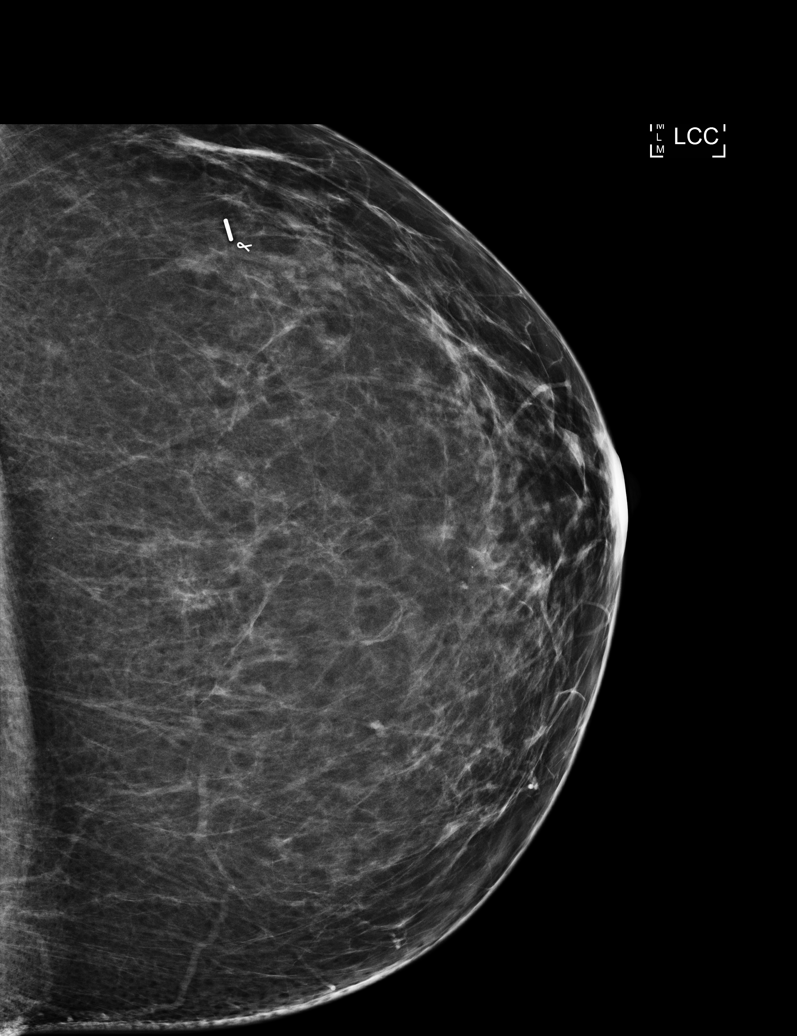

[5 of 5 positions shown; findings below may reference images not displayed]

FINDINGS: Patient presents for radioactive seed localization prior to left
breast excision. I met with the patient and we discussed the
procedure of seed localization including benefits and alternatives.
We discussed the high likelihood of a successful procedure. We
discussed the risks of the procedure including infection, bleeding,
tissue injury and further surgery. We discussed the low dose of
radioactivity involved in the procedure. Informed, written consent
was given.

The usual time-out protocol was performed immediately prior to the
procedure.

Using mammographic guidance, sterile technique, 1% lidocaine and an
U-CMK radioactive seed, the previously placed ribbon shaped biopsy
marker clip in the outer left breast was localized using a lateral
approach. The follow-up mammogram images confirm the seed in the
expected location and were marked for Dr. Yanwar.

Follow-up survey of the patient confirms presence of the radioactive
seed.

Order number of U-CMK seed:  [id number].

Total activity:  0.252 millicurie  Reference Date: 10/06/2016

The patient tolerated the procedure well and was released from the
[REDACTED]. She was given instructions regarding seed removal.
IMPRESSION: Radioactive seed localization left breast. No apparent
complications.

## 2018-07-05 IMAGING — MG BREAST SURGICAL SPECIMEN
1 series · 1 of 1 positions shown · non-contrast
Comparison: Previous exam(s).

CLINICAL DATA: Status post surgery for removal of possible complex
sclerosing lesion.

EXAM:
SPECIMEN RADIOGRAPH OF THE LEFT BREAST

[L]
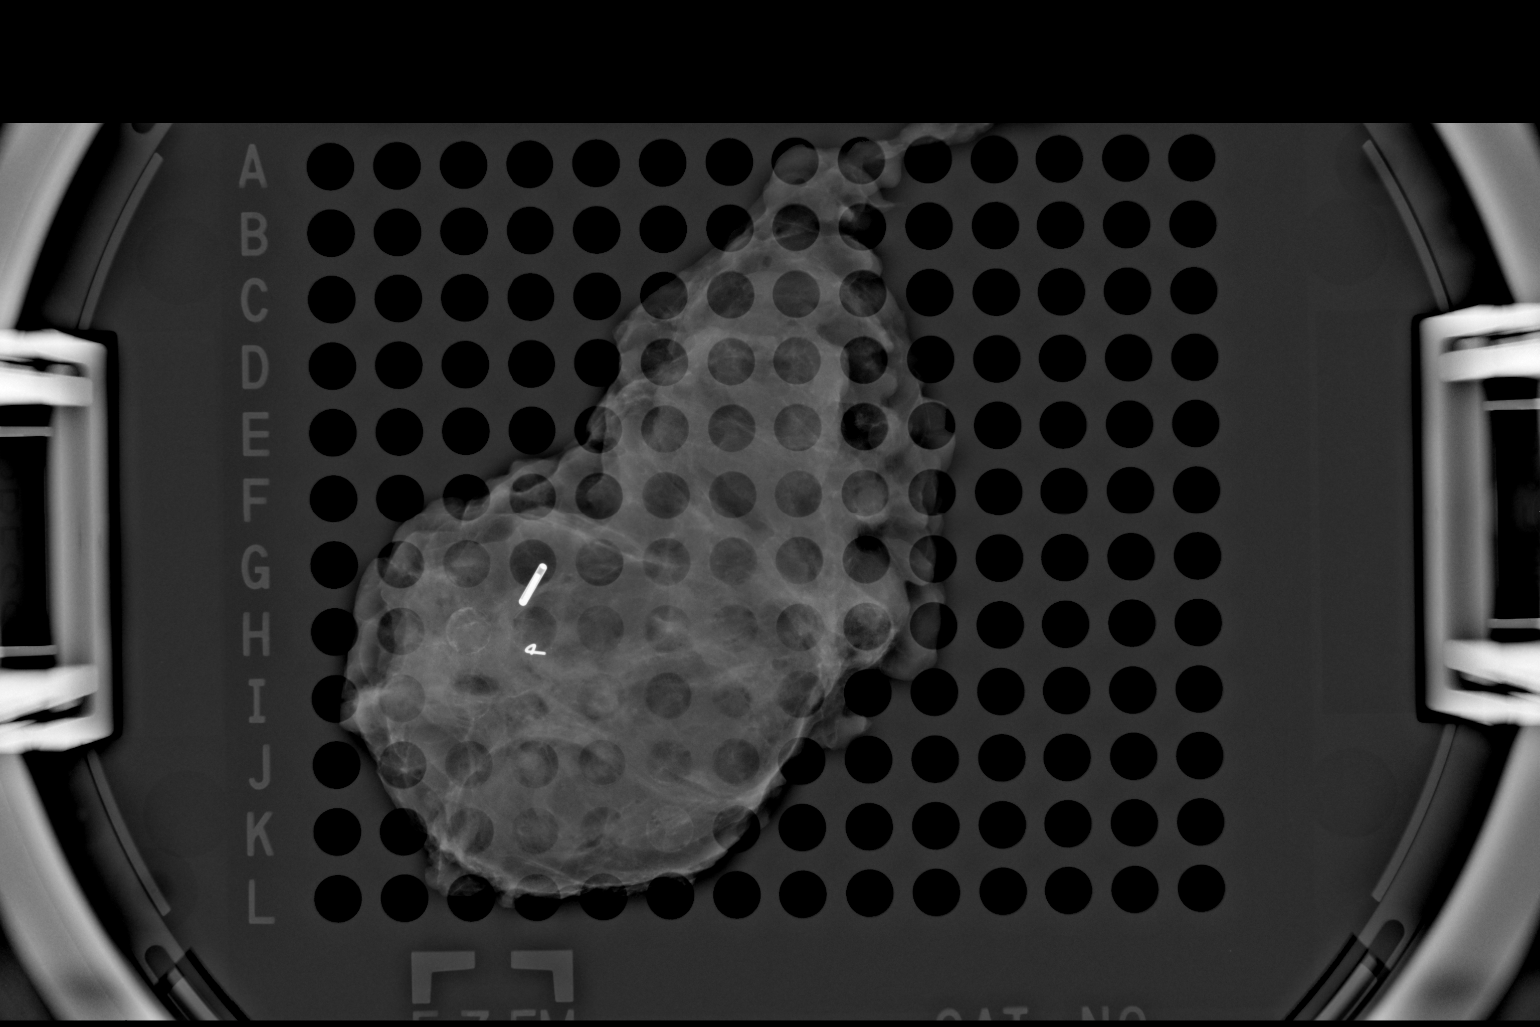

[1 of 1 positions shown; findings below may reference images not displayed]

FINDINGS: Status post excision of the left breast. The radioactive seed and
ribbon biopsy marker clip are present, completely intact, and were
marked for pathology.
IMPRESSION: Specimen radiograph of the left breast.

## 2018-07-22 ENCOUNTER — Ambulatory Visit (HOSPITAL_COMMUNITY): Payer: Self-pay

## 2018-08-19 ENCOUNTER — Ambulatory Visit (HOSPITAL_COMMUNITY)
Admission: RE | Admit: 2018-08-19 | Discharge: 2018-08-19 | Disposition: A | Discharge status: Home / Self Care | Payer: BLUE CROSS/BLUE SHIELD | Source: Ambulatory Visit | Attending: Obstetrics and Gynecology | Admitting: Obstetrics and Gynecology

## 2018-08-19 ENCOUNTER — Other Ambulatory Visit: Payer: Self-pay

## 2018-08-19 DIAGNOSIS — Z1231 Encounter for screening mammogram for malignant neoplasm of breast: Secondary | ICD-10-CM

## 2018-09-02 ENCOUNTER — Encounter: Payer: Self-pay | Admitting: Gastroenterology

## 2018-09-02 ENCOUNTER — Other Ambulatory Visit: Payer: Self-pay

## 2018-09-02 ENCOUNTER — Ambulatory Visit: Payer: BLUE CROSS/BLUE SHIELD | Admitting: Gastroenterology

## 2018-09-02 ENCOUNTER — Telehealth: Payer: Self-pay | Admitting: *Deleted

## 2018-09-02 VITALS — BP 142/90 | HR 91 | Temp 97.2°F | Ht 61.0 in | Wt 183.6 lb

## 2018-09-02 DIAGNOSIS — K7581 Nonalcoholic steatohepatitis (NASH): Secondary | ICD-10-CM | POA: Diagnosis not present

## 2018-09-02 MED ORDER — PANTOPRAZOLE SODIUM 40 MG PO TBEC: 40.0000 mg | DELAYED_RELEASE_TABLET | Freq: Every day | ORAL | 3 refills | Status: DC

## 2018-09-02 NOTE — Assessment & Plan Note (Signed)
Biopsy proven in 2000. Elastography 5 years ago with Metavir score of F4 but fibrotest with little to no fibrosis. Very likely mild fibrosis, and she has had no stigmata of portal hypertension on EGD in 2015. Will follow closely with serial ultrasounds yearly. Obtain most recent labs. Discussed diet and exercise changes, weight loss, behavior modification. She was advised to call PCP due to insomnia. Return in 6 months for close follow-up. Hold on screening EGD unless evidence for advanced fibrosis or cirrhosis on [place] upcoming. Colonoscopy in 2022 due to [place] of colon polyps.

## 2018-09-02 NOTE — Telephone Encounter (Signed)
U/S scheduled for 09/09/2018 at 7:30am, arrival time 7:15am, npo after midnight  Spoke with patient and she is aware of appt details. She voiced understanding

## 2018-09-02 NOTE — Progress Notes (Signed)
Primary Care Physician:  Glenda Chroman, MD  Primary GI: Dr. Gala Romney   Chief Complaint  Patient presents with  . Gastroesophageal Reflux    needs refill    HPI:   Loretta Schaefer is a 48 y.o. female presenting today with a history of biopsy-proven NASH (2000) and GERD. Mother and aunt both with NASH cirrhosis. Last elastography in July 2015 with Metavir score of F4. Fibrotest with little to no fibrosis. [place] yearly recommended. EGD Aug 2015 with small hiatal hernia and no evidence of portal hypertension. Felt likely to not have advanced fibrosis. Recent labs completed at PCP a few months ago, which we will request. Hep A/B vaccination: completed in 2017. Last colonoscopy in 2017 with surveillance due in 2022. Family history of colon polyps in mother less than age 64.   Due for US abdomen now. She has gained weight and is frustrated about this. Stable for past year, but has gained over 10 lbs over the past 2 years and about 20 over past 4 years. Father passed away a few months ago. Difficulty sleeping. No abdominal pain, jaundice, confusion, mental status changes. Difficult eating healthy but knows she should. Has done weight watchers in the past, but then it didn't work as well for her. [place] but then had to stop due to COVID.   Protonix is controlling GERD symptoms. Needs refill.   Past Medical History:  Diagnosis Date  . Anxiety   . Condyloma   . Depression   . Fibrocystic changes of left breast 05/29/2016  . GERD (gastroesophageal reflux disease)   . History of pyelonephritis 2003   right  . Hyperplastic colon polyp 06/25/10   colonoscopy by Dr. Gala Romney  . Hypertension    history of,is not on any medicqtions  . Peri-menopause 02/06/2014  . Steatohepatitis 2000   without cirrhosis. Completed Hep A/B vaccinations    Past Surgical History:  Procedure Laterality Date  . ABDOMINAL HYSTERECTOMY    . BREAST BIOPSY Left 03/2016   [place]      x 2  . COLONOSCOPY  05/2010   normal TI, hyperplastic rectal polyp, friable anal canal  . COLONOSCOPY N/A 07/11/2015   Procedure: COLONOSCOPY;  Surgeon: Daneil Dolin, MD;  [facility];  Service: Endoscopy;  Laterality: N/A;  1130  . ESOPHAGOGASTRODUODENOSCOPY  05/2010   distal ERE, small hh  . ESOPHAGOGASTRODUODENOSCOPY N/A 11/13/2013   MBT:DHRCB hiatal hernia; otherwise, negative EGD.Nostigmata of cirrhosis foundPatient may not have cirrhosis at  . LIVER BIOPSY  2000's   [facility]  . PANNICULECTOMY  2005  . RADIOACTIVE SEED GUIDED EXCISIONAL BREAST BIOPSY Left 10/08/2016   Procedure: RADIOACTIVE SEED GUIDED EXCISIONAL LEFT  BREAST BIOPSY;  Surgeon: Rolm Bookbinder, MD;  Location: [place];  Service: General;  Laterality: Left;  . S/P Hysterectomy  2005  . TOE SURGERY     surgery on left big toe at joint  . WISDOM TOOTH EXTRACTION      Current Outpatient Medications  Medication Sig Dispense Refill  . buPROPion (WELLBUTRIN SR) 200 MG 12 hr tablet Take 200 mg by mouth daily.    Marland Kitchen ibuprofen (ADVIL,MOTRIN) 200 MG tablet Take 400 mg by mouth 2 (two) times daily as needed for headache.    . loratadine (CLARITIN) 10 MG tablet Take 10 mg by mouth daily.    . pantoprazole (PROTONIX) 40 MG tablet Take 1 tablet (40 mg total) by mouth daily. [place]  minutes before breakfast 90 tablet 3  . vitamin E 400 UNIT capsule Take 400 Units by mouth daily.     No current facility-administered medications for this visit.     Allergies as of 09/02/2018 - Review Complete 09/02/2018  Allergen Reaction Noted  . Omnipaque [iohexol] Hives and Itching 06/02/2010    Family History  Problem Relation Age of Onset  . Colon polyps Mother        less than age 59  . Cirrhosis Mother        NASH, deceased  . Depression Mother   . Diabetes Mother   . Heart disease Mother   . Cirrhosis Maternal Aunt        NASH  . Bipolar disorder Sister   . Asthma Sister   . Depression Sister         bipolar  . COPD Sister   . Asthma Brother   . Diabetes Sister   . Hypertension Sister   . Other Sister        degenerative disc in back  . Supraventricular tachycardia Son   . Asthma Son   . Heart attack Father   . COPD Father   . Emphysema Father     Social History   Socioeconomic History  . Marital status: Married    Spouse name: Not on file  . Number of children: Not on file  . Years of education: Not on file  . Highest education level: Not on file  Occupational History  . Not on file  Social Needs  . Financial resource strain: Not on file  . Food insecurity:    Worry: Not on file    Inability: Not on file  . Transportation needs:    Medical: Not on file    Non-medical: Not on file  Tobacco Use  . Smoking status: Never Smoker  . Smokeless tobacco: Never Used  Substance and Sexual Activity  . Alcohol use: No  . Drug use: No  . Sexual activity: Yes    Birth control/protection: Surgical    Comment: hyst  Lifestyle  . Physical activity:    Days per week: Not on file    Minutes per session: Not on file  . Stress: Not on file  Relationships  . Social connections:    Talks on phone: Not on file    Gets together: Not on file    Attends religious service: Not on file    Active member of club or organization: Not on file    Attends meetings of clubs or organizations: Not on file    Relationship status: Not on file  Other Topics Concern  . Not on file  Social History Narrative  . Not on file    Review of Systems: Gen: Denies fever, chills, anorexia. Denies fatigue, weakness, weight loss.  CV: Denies chest pain, palpitations, syncope, peripheral edema, and claudication. Resp: Denies dyspnea at rest, cough, wheezing, coughing up blood, and pleurisy. GI: see HPI  Derm: Denies rash, itching, dry skin Psych: Denies depression, anxiety, memory loss, confusion. No homicidal or suicidal ideation.  Heme: Denies bruising, bleeding, and enlarged lymph nodes.   Physical Exam: BP (!) 156/95   Pulse 91   Temp (!) 97.2 F (36.2 C) (Oral)   Ht 5' 1"  (1.549 m)   Wt 183 lb 9.6 oz (83.3 kg)   BMI 34.69 kg/m  General:   Alert and oriented. No distress noted. Pleasant and cooperative.  Head:  Normocephalic and atraumatic. Eyes:  Conjuctiva clear without scleral icterus. Lungs: clear to auscultation bilaterally Cardiac: S1 S2 present without murmurs  Mouth:  Oral mucosa pink and moist.  Abdomen:  +BS, soft, non-tender and non-distended. No rebound or guarding. No HSM or masses noted. Msk:  Symmetrical without gross deformities. Normal posture. Extremities:  Without edema. Neurologic:  Alert and  oriented x4 Psych:  Alert and cooperative. Normal mood and affect.

## 2018-09-02 NOTE — Patient Instructions (Signed)
We will be scheduling you for an ultrasound in the near future.  I am getting labs from your primary care.  We will hold off on endoscopy now unless there are changes of cirrhosis on ultrasounds in the future.  I will see you in 6 months!!  Call your primary care soon to discuss an agent to help you sleep!    I enjoyed seeing you again today! As you know, I value our relationship and want to provide genuine, compassionate, and quality care. I welcome your feedback. If you receive a survey regarding your visit,  I greatly appreciate you taking time to fill this out. See you next time!  Annitta Needs, PhD, ANP-BC [facility]

## 2018-09-05 NOTE — Progress Notes (Signed)
CC'D TO PCP °

## 2018-09-09 ENCOUNTER — Ambulatory Visit: Payer: BLUE CROSS/BLUE SHIELD | Admitting: Gastroenterology

## 2018-09-09 ENCOUNTER — Other Ambulatory Visit: Payer: Self-pay

## 2018-09-09 ENCOUNTER — Ambulatory Visit (HOSPITAL_COMMUNITY)
Admission: RE | Admit: 2018-09-09 | Discharge: 2018-09-09 | Disposition: A | Discharge status: Home / Self Care | Payer: BC Managed Care – PPO | Source: Ambulatory Visit | Attending: Gastroenterology | Admitting: Gastroenterology

## 2018-09-09 DIAGNOSIS — K7581 Nonalcoholic steatohepatitis (NASH): Secondary | ICD-10-CM | POA: Diagnosis present

## 2019-01-02 NOTE — Progress Notes (Signed)
Outside labs dated Nov 2019 from PCP. Hgb 14.1, HXt 41.5, platelets 311, Tbili 0.4, Alk Phos 58, ASt 25, ALT 32

## 2019-03-17 ENCOUNTER — Encounter: Payer: Self-pay | Admitting: Gastroenterology

## 2019-03-17 ENCOUNTER — Ambulatory Visit: Payer: BLUE CROSS/BLUE SHIELD | Admitting: Gastroenterology

## 2019-03-17 ENCOUNTER — Other Ambulatory Visit: Payer: Self-pay

## 2019-03-17 VITALS — BP 155/95 | HR 88 | Temp 97.0°F | Ht 60.0 in | Wt 183.6 lb

## 2019-03-17 DIAGNOSIS — K7581 Nonalcoholic steatohepatitis (NASH): Secondary | ICD-10-CM

## 2019-03-17 MED ORDER — PANTOPRAZOLE SODIUM 40 MG PO TBEC: 40.0000 mg | DELAYED_RELEASE_TABLET | Freq: Every day | ORAL | 3 refills | Status: DC

## 2019-03-17 NOTE — Progress Notes (Signed)
Primary Care Physician:  Glenda Chroman, MD Primary GI: Dr. Gala Romney   Chief Complaint  Patient presents with  . nash    f/u    HPI:   Loretta Schaefer is a 48 y.o. female presenting today with a history of biopsy-proven NASH (2000) and GERD. Mother and aunt both with NASH cirrhosis. Last elastography in July 2015 with Metavir score of F4. Fibrotest with little to no fibrosis. [place] yearly recommended. EGD Aug 2015 with small hiatal hernia and no evidence of portal hypertension. Felt likely to not have advanced fibrosis. Hep A/B vaccinations completed. Outside labs dated Nov 2019 from PCP. Hgb 14.1, HXt 41.5, platelets 311, Tbili 0.4, Alk Phos 58, ASt 25, ALT 32. Last colonoscopy in 2017 with surveillance due in 2022. Family history of colon polyps in mother less than age 10.   [place] June 2020 with fatty liver. Will pursue surveillance in June 2021. Protonix once daily. Recently diagnosed with RA. No abdominal pain, N/V. GERD controlled. No dysphagia. Difficult to find time to exercise. Weight same as last visit.   Past Medical History:  Diagnosis Date  . Anxiety   . Condyloma   . Depression   . Fibrocystic changes of left breast 05/29/2016  . GERD (gastroesophageal reflux disease)   . History of pyelonephritis 2003   right  . Hyperplastic colon polyp 06/25/10   colonoscopy by Dr. Gala Romney  . Hypertension    history of,is not on any medicqtions  . Peri-menopause 02/06/2014  . Steatohepatitis 2000   without cirrhosis. Completed Hep A/B vaccinations    Past Surgical History:  Procedure Laterality Date  . ABDOMINAL HYSTERECTOMY    . BREAST BIOPSY Left 03/2016   [place]     x 2  . COLONOSCOPY  05/2010   normal TI, hyperplastic rectal polyp, friable anal canal  . COLONOSCOPY N/A 07/11/2015   One 5 mm polyp in the descending colon, s/p resection and retrieval. Internal hemorrhoids. Hyperplastic polyp. Surveillance 2022.   . ESOPHAGOGASTRODUODENOSCOPY  05/2010   distal ERE, small hh  . ESOPHAGOGASTRODUODENOSCOPY N/A 11/13/2013   FBX:UXYBF hiatal hernia; otherwise, negative EGD.Nostigmata of cirrhosis foundPatient may not have cirrhosis at  . LIVER BIOPSY  2000's   [facility]  . PANNICULECTOMY  2005  . RADIOACTIVE SEED GUIDED EXCISIONAL BREAST BIOPSY Left 10/08/2016   Procedure: RADIOACTIVE SEED GUIDED EXCISIONAL LEFT  BREAST BIOPSY;  Surgeon: Rolm Bookbinder, MD;  Location: [place];  Service: General;  Laterality: Left;  . S/P Hysterectomy  2005  . TOE SURGERY     surgery on left big toe at joint  . WISDOM TOOTH EXTRACTION      Current Outpatient Medications  Medication Sig Dispense Refill  . buPROPion (WELLBUTRIN SR) 200 MG 12 hr tablet Take 300 mg by mouth daily.     Marland Kitchen ibuprofen (ADVIL,MOTRIN) 200 MG tablet Take 400 mg by mouth 2 (two) times daily as needed for headache.    . loratadine (CLARITIN) 10 MG tablet Take 10 mg by mouth daily.    . pantoprazole (PROTONIX) 40 MG tablet Take 1 tablet (40 mg total) by mouth daily. 30 minutes before breakfast 90 tablet 3  . vitamin E 400 UNIT capsule Take 400 Units by mouth daily.     No current facility-administered medications for this visit.    Allergies as of 03/17/2019 - Review Complete 03/17/2019  Allergen Reaction Noted  . Omnipaque [iohexol] Hives and Itching 06/02/2010  Family History  Problem Relation Age of Onset  . Colon polyps Mother        less than age 26  . Cirrhosis Mother        NASH, deceased  . Depression Mother   . Diabetes Mother   . Heart disease Mother   . Cirrhosis Maternal Aunt        NASH  . Bipolar disorder Sister   . Asthma Sister   . Depression Sister        bipolar  . COPD Sister   . Asthma Brother   . Diabetes Sister   . Hypertension Sister   . Other Sister        degenerative disc in back  . Supraventricular tachycardia Son   . Asthma Son   . Heart attack Father   . COPD Father   . Emphysema Father     Social History    Socioeconomic History  . Marital status: Married    Spouse name: Not on file  . Number of children: Not on file  . Years of education: Not on file  . Highest education level: Not on file  Occupational History  . Not on file  Tobacco Use  . Smoking status: Never Smoker  . Smokeless tobacco: Never Used  Substance and Sexual Activity  . Alcohol use: No  . Drug use: No  . Sexual activity: Yes    Birth control/protection: Surgical    Comment: hyst  Other Topics Concern  . Not on file  Social History Narrative  . Not on file   Social Determinants of Health   Financial Resource Strain:   . Difficulty of Paying Living Expenses: Not on file  Food Insecurity:   . Worried About Charity fundraiser in the Last Year: Not on file  . Ran Out of Food in the Last Year: Not on file  Transportation Needs:   . Lack of Transportation (Medical): Not on file  . Lack of Transportation (Non-Medical): Not on file  Physical Activity:   . Days of Exercise per Week: Not on file  . Minutes of Exercise per Session: Not on file  Stress:   . Feeling of Stress : Not on file  Social Connections:   . Frequency of Communication with Friends and Family: Not on file  . Frequency of Social Gatherings with Friends and Family: Not on file  . Attends Religious Services: Not on file  . Active Member of Clubs or Organizations: Not on file  . Attends Archivist Meetings: Not on file  . Marital Status: Not on file    Review of Systems: Gen: Denies fever, chills, anorexia. Denies fatigue, weakness, weight loss.  CV: Denies chest pain, palpitations, syncope, peripheral edema, and claudication. Resp: Denies dyspnea at rest, cough, wheezing, coughing up blood, and pleurisy. GI: see HPI Derm: Denies rash, itching, dry skin Psych: Denies depression, anxiety, memory loss, confusion. No homicidal or suicidal ideation.  Heme: Denies bruising, bleeding, and enlarged lymph nodes.  Physical Exam: BP (!)  155/95   Pulse 88   Temp (!) 97 F (36.1 C) (Oral)   Ht 5' (1.524 m)   Wt 183 lb 9.6 oz (83.3 kg)   BMI 35.86 kg/m  General:   Alert and oriented. No distress noted. Pleasant and cooperative.  Head:  Normocephalic and atraumatic. Abdomen:  +BS, soft, non-tender and non-distended. No rebound or guarding. No HSM or masses noted. Msk:  Symmetrical without gross deformities. Normal posture. Extremities:  Without edema. Neurologic:  Alert and  oriented x4 Psych:  Alert and cooperative. Normal mood and affect.  ASSESSMENT/PLAN: NURY NEBERGALL is a 48 y.o. female presenting today with history of biopsy-proven NASH without stigmata of portal hypertension, doing well currently and stable. Recent labs done as outpatient and will request from PCP. Yearly ultrasounds recommended in light of advanced fibrosis score historically. EGD in past without stigmata of portal hypertension. [place] due again in summer 2021, which we will arrange. GERD well-controlled. Discussed again diet and exercise modifications. Continue Protonix daily and return in 1 year for follow-up.   Annitta Needs, PhD, ANP-BC [facility]astroenter[facility]

## 2019-03-17 NOTE — Patient Instructions (Signed)
It was good to see you!  Take time for yourself on the bike and work up to 30 minutes a day most days a week. Just getting started is the hardest part! I know you can do it.  We will order an ultrasound in June 2021 and see you back around Nov/Dec 2021.  Have a Merry Christmas!  I enjoyed seeing you again today! As you know, I value our relationship and want to provide genuine, compassionate, and quality care. I welcome your feedback. If you receive a survey regarding your visit,  I greatly appreciate you taking time to fill this out. See you next time!  Annitta Needs, PhD, ANP-BC [facility]astroenter[facility]

## 2019-04-10 ENCOUNTER — Encounter: Payer: Self-pay | Admitting: Gastroenterology

## 2019-04-10 NOTE — Progress Notes (Signed)
Received outside labs.  Nov 2020: heme negative.  Hgb 14.8, Hct 43.6, Platelets 334, TSH 2.110, Total cholesterol 215, Triglycerides 207, HDL 43, LDL 135, creatinine 0.99, BUN 8, Albumin 4.5, Tbili 0.5, AST 24, ALT 28, Alk Phos 81,

## 2019-06-15 NOTE — Progress Notes (Signed)
Office Visit Note  Patient: Loretta Schaefer             Date of Birth: [birth year]           MRN: [id number]             PCP: Glenda Chroman, MD Referring: Glenda Chroman, MD Visit Date: 06/23/2019 Occupation: _0 @  Subjective:  New Patient (Initial Visit) (Abnormal labs)   History of Present Illness: BRALEE Schaefer is a 49 y.o. female seen in consultation per request of Ms. McKenzie, her PCP.  According to patient 2 years ago she opened a can and noticed a knot on her right index finger which was painful.  Over time she noticed not on all of her fingers.  She states they have been painful.  They do not swell.  None of the other joints are painful.  There is positive family history of osteoarthritis in her parents and her sister.  Activities of Daily Living:  Patient reports morning stiffness for 0 none.   Patient Denies nocturnal pain.  Difficulty dressing/grooming: Denies Difficulty climbing stairs: Denies Difficulty getting out of chair: Denies Difficulty using hands for taps, buttons, cutlery, and/or writing: Reports  Review of Systems  Constitutional: Positive for fatigue. Negative for night sweats, weight gain and weight loss.  HENT: Positive for mouth dryness. Negative for mouth sores, trouble swallowing, trouble swallowing and nose dryness.   Eyes: Negative for pain, redness, itching, visual disturbance and dryness.  Respiratory: Negative for cough, shortness of breath and difficulty breathing.   Cardiovascular: Negative for chest pain, palpitations, hypertension, irregular heartbeat and swelling in legs/feet.  Gastrointestinal: Negative for blood in stool, constipation and diarrhea.  Endocrine: Negative for heat intolerance and increased urination.  Genitourinary: Negative for difficulty urinating and vaginal dryness.  Musculoskeletal: Positive for arthralgias and joint pain. Negative for joint swelling, myalgias, muscle weakness, morning stiffness, muscle tenderness and  myalgias.  Skin: Negative for color change, rash, hair loss, skin tightness, ulcers and sensitivity to sunlight.  Allergic/Immunologic: Negative for susceptible to infections.  Neurological: Negative for dizziness, numbness, memory loss, night sweats and weakness.  Hematological: Negative for bruising/bleeding tendency and swollen glands.  Psychiatric/Behavioral: Positive for depressed mood and sleep disturbance. The patient is nervous/anxious.     PMFS History:  Patient Active Problem List   Diagnosis Date Noted  . Well woman exam with routine gynecological exam 06/11/2017  . Depression 06/11/2017  . Abnormal mammogram of left breast 07/03/2016  . Body mass index 34.0-34.9, adult 07/03/2016  . Weight loss counseling, encounter for 07/03/2016  . Fibrocystic changes of left breast 05/29/2016  . History of colonic polyps 07/03/2015  . Peri-menopause 02/06/2014  . Condyloma 07/05/2012  . Anxiety 04/14/2012  . GERD (gastroesophageal reflux disease) 01/14/2011  . NASH (nonalcoholic steatohepatitis) 06/11/2010    Past Medical History:  Diagnosis Date  . Anxiety   . Condyloma   . Depression   . Fibrocystic changes of left breast 05/29/2016  . GERD (gastroesophageal reflux disease)   . History of pyelonephritis 2003   right  . Hyperplastic colon polyp 06/25/10   colonoscopy by Dr. Gala Romney  . Hypertension    history of,is not on any medicqtions  . Peri-menopause 02/06/2014  . RA (rheumatoid arthritis) ([place])   . Steatohepatitis 2000   without cirrhosis. Completed Hep A/B vaccinations    Family History  Problem Relation Age of Onset  . Colon polyps Mother        less than age  73  . Cirrhosis Mother        NASH, deceased  . Depression Mother   . Diabetes Mother   . Heart disease Mother   . Cirrhosis Maternal Aunt        NASH  . Bipolar disorder Sister   . Asthma Sister   . Depression Sister        bipolar  . COPD Sister   . Asthma Brother   . Diabetes Sister   .  Hypertension Sister   . Other Sister        degenerative disc in back  . Supraventricular tachycardia Son   . Asthma Son   . Heart attack Father   . COPD Father   . Emphysema Father    Past Surgical History:  Procedure Laterality Date  . ABDOMINAL HYSTERECTOMY    . BREAST BIOPSY Left 03/2016   [place]     x 2  . COLONOSCOPY  05/2010   normal TI, hyperplastic rectal polyp, friable anal canal  . COLONOSCOPY N/A 07/11/2015   One 5 mm polyp in the descending colon, s/p resection and retrieval. Internal hemorrhoids. Hyperplastic polyp. Surveillance 2022.   . ESOPHAGOGASTRODUODENOSCOPY  05/2010   distal ERE, small hh  . ESOPHAGOGASTRODUODENOSCOPY N/A 11/13/2013   PIR:JJOAC hiatal hernia; otherwise, negative EGD.Nostigmata of cirrhosis foundPatient may not have cirrhosis at  . FOOT SURGERY    . LIVER BIOPSY  2000's   [place] [facility]  . PANNICULECTOMY  2005  . RADIOACTIVE SEED GUIDED EXCISIONAL BREAST BIOPSY Left 10/08/2016   Procedure: RADIOACTIVE SEED GUIDED EXCISIONAL LEFT  BREAST BIOPSY;  Surgeon: Rolm Bookbinder, MD;  Location: [facility];  Service: General;  Laterality: Left;  . S/P Hysterectomy  2005  . TOE SURGERY     surgery on left big toe at joint  . WISDOM TOOTH EXTRACTION     Social History   Social History Narrative  . Not on file   Immunization History  Administered Date(s) Administered  . Influenza-Unspecified 01/24/2015     Objective: Vital Signs: BP 136/82 (BP Location: Right Arm, Patient Position: Sitting, Cuff Size: Normal)   Pulse 99   Resp 14   Ht 5' 1" (1.549 m)   Wt 185 lb (83.9 kg)   BMI 34.96 kg/m    Physical Exam Vitals and nursing note reviewed.  Constitutional:      Appearance: She is well-developed.  HENT:     Head: Normocephalic and atraumatic.  Eyes:     Conjunctiva/sclera: Conjunctivae normal.  Cardiovascular:     Rate and Rhythm: Normal rate and regular rhythm.     Heart sounds: Normal heart sounds.   Pulmonary:     Effort: Pulmonary effort is normal.     Breath sounds: Normal breath sounds.  Abdominal:     General: Bowel sounds are normal.     Palpations: Abdomen is soft.  Musculoskeletal:     Cervical back: Normal range of motion.  Lymphadenopathy:     Cervical: No cervical adenopathy.  Skin:    General: Skin is warm and dry.     Capillary Refill: Capillary refill takes less than 2 seconds.  Neurological:     Mental Status: She is alert and oriented to person, place, and time.  Psychiatric:        Behavior: Behavior normal.      Musculoskeletal Exam: C-spine thoracic and lumbar spine were in good range of motion.  Shoulder joints, elbow joints, wrist joints  with good range of motion.  She has no swelling or synovitis over MCPs or PIPs.  DIP and PIP thickening was noted.  Hip joints, knee joints were in good range of motion.  She has no tenderness over ankle joints MTPs or PIPs.  CDAI Exam: CDAI Score: -- Patient Global: --; Provider Global: -- Swollen: --; Tender: -- Joint Exam 06/23/2019   No joint exam has been documented for this visit   There is currently no information documented on the homunculus. Go to the Rheumatology activity and complete the homunculus joint exam.  Investigation: No additional findings.  Imaging: XR Hand 2 View Left  Result Date: 06/23/2019 Narrowing of CMC and all PIP joints was noted.  Severe narrowing of DIP joints was noted.  No MCP, intercarpal or radiocarpal joint space narrowing was noted.  No erosive changes were noted. Impression: These findings are consistent with osteoarthritis of the hand.  XR Hand 2 View Right  Result Date: 06/23/2019 Narrowing of CMC and all PIP joints was noted.  Severe narrowing of DIP joints was noted.  No MCP, intercarpal or radiocarpal joint space narrowing was noted.  No erosive changes were noted. Impression: These findings are consistent with osteoarthritis of the hand.   Recent Labs: Lab Results   Component Value Date   WBC 8.2 10/02/2016   HGB 14.2 10/02/2016   PLT 253 10/02/2016   NA 135 10/02/2016   K 4.0 10/02/2016   CL 104 10/02/2016   CO2 23 10/02/2016   GLUCOSE 93 10/02/2016   BUN 9 10/02/2016   CREATININE 0.87 10/02/2016   BILITOT 0.8 10/02/2016   ALKPHOS 48 10/02/2016   AST 27 10/02/2016   ALT 37 10/02/2016   PROT 7.0 10/02/2016   ALBUMIN 4.2 10/02/2016   CALCIUM 9.3 10/02/2016   GFRAA >60 10/02/2016    Speciality Comments: No specialty comments available.  Procedures:  No procedures performed Allergies: Omnipaque [iohexol]   Assessment / Plan:     Visit Diagnoses: Pain in both hands -she complains of pain and discomfort in her bilateral hands and intermittent swelling.  She has PIP and DIP thickening on examination.  No synovitis was noted on examination today.  No tenderness was noted over MCPs or wrist joints.  Clinical findings are consistent with osteoarthritis.  She also has a strong family history of osteoarthritis.  Plan: XR Hand 2 View Right, XR Hand 2 View Left.  The x-ray findings were consistent with osteoarthritis.  A handout on hand muscle strengthening exercise was given.  Natural anti-inflammatories were discussed.  Patient has a mucinous cyst on her right first DIP joint.  I offered referral to surgery but she declined.  Rheumatoid factor positive -rheumatoid factor is borderline positive.  She has no synovitis on examination.  Have advised her to contact me in case she develops new symptoms.  Normal values from 0-13.9.  02/10/19: CBC WNL, CMP WNL, TSH 2.11, Lipid panel WNL, ANA-, RF 14.1, CRP 7, ESR 27   Other medical problems listed as follows:  Other fatigue  Elevated cholesterol  Essential hypertension  NASH (nonalcoholic steatohepatitis)  Gastroesophageal reflux disease without esophagitis  History of colonic polyps  Fibrocystic changes of left breast  Peri-menopause  Anxiety and depression  Orders: Orders Placed This  Encounter  Procedures  . XR Hand 2 View Right  . XR Hand 2 View Left   No orders of the defined types were placed in this encounter.   Face-to-face time spent with patient was 30  minutes. Greater than 50% of time was spent in counseling and coordination of care.  Follow-Up Instructions: Return in about 1 year (around 06/22/2020) for Osteoarthritis.   Bo Merino, MD  Note - This record has been created using Editor, commissioning.  Chart creation errors have been sought, but may not always  have been located. Such creation errors do not reflect on  the standard of medical care.

## 2019-06-23 ENCOUNTER — Ambulatory Visit: Payer: Self-pay

## 2019-06-23 ENCOUNTER — Encounter: Payer: Self-pay | Admitting: Rheumatology

## 2019-06-23 ENCOUNTER — Ambulatory Visit: Payer: BC Managed Care – PPO | Admitting: Rheumatology

## 2019-06-23 ENCOUNTER — Other Ambulatory Visit: Payer: Self-pay

## 2019-06-23 VITALS — BP 136/82 | HR 99 | Resp 14 | Ht 61.0 in | Wt 185.0 lb

## 2019-06-23 DIAGNOSIS — R768 Other specified abnormal immunological findings in serum: Secondary | ICD-10-CM

## 2019-06-23 DIAGNOSIS — R5383 Other fatigue: Secondary | ICD-10-CM | POA: Diagnosis not present

## 2019-06-23 DIAGNOSIS — M79641 Pain in right hand: Secondary | ICD-10-CM | POA: Diagnosis not present

## 2019-06-23 DIAGNOSIS — M79642 Pain in left hand: Secondary | ICD-10-CM | POA: Diagnosis not present

## 2019-06-23 DIAGNOSIS — F32A Depression, unspecified: Secondary | ICD-10-CM

## 2019-06-23 DIAGNOSIS — I10 Essential (primary) hypertension: Secondary | ICD-10-CM

## 2019-06-23 DIAGNOSIS — F419 Anxiety disorder, unspecified: Secondary | ICD-10-CM

## 2019-06-23 DIAGNOSIS — K7581 Nonalcoholic steatohepatitis (NASH): Secondary | ICD-10-CM

## 2019-06-23 DIAGNOSIS — N6012 Diffuse cystic mastopathy of left breast: Secondary | ICD-10-CM

## 2019-06-23 DIAGNOSIS — Z86010 Personal history of colonic polyps: Secondary | ICD-10-CM

## 2019-06-23 DIAGNOSIS — K219 Gastro-esophageal reflux disease without esophagitis: Secondary | ICD-10-CM

## 2019-06-23 DIAGNOSIS — E7800 Pure hypercholesterolemia, unspecified: Secondary | ICD-10-CM | POA: Diagnosis not present

## 2019-06-23 DIAGNOSIS — F329 Major depressive disorder, single episode, unspecified: Secondary | ICD-10-CM

## 2019-06-23 DIAGNOSIS — N951 Menopausal and female climacteric states: Secondary | ICD-10-CM

## 2019-06-23 NOTE — Patient Instructions (Signed)
Hand Exercises Hand exercises can be helpful for almost anyone. These exercises can strengthen the hands, improve flexibility and movement, and increase blood flow to the hands. These results can make work and daily tasks easier. Hand exercises can be especially helpful for people who have joint pain from arthritis or have nerve damage from overuse (carpal tunnel syndrome). These exercises can also help people who have injured a hand. Exercises Most of these hand exercises are gentle stretching and motion exercises. It is usually safe to do them often throughout the day. Warming up your hands before exercise may help to reduce stiffness. You can do this with gentle massage or by placing your hands in warm water for 10-15 minutes. It is normal to feel some stretching, pulling, tightness, or mild discomfort as you begin new exercises. This will gradually improve. Stop an exercise right away if you feel sudden, severe pain or your pain gets worse. Ask your health care provider which exercises are best for you. Knuckle bend or "claw" fist 1. Stand or sit with your arm, hand, and all five fingers pointed straight up. Make sure to keep your wrist straight during the exercise. 2. Gently bend your fingers down toward your palm until the tips of your fingers are touching the top of your palm. Keep your big knuckle straight and just bend the small knuckles in your fingers. 3. Hold this position for __________ seconds. 4. Straighten (extend) your fingers back to the starting position. Repeat this exercise 5-10 times with each hand. Full finger fist 1. Stand or sit with your arm, hand, and all five fingers pointed straight up. Make sure to keep your wrist straight during the exercise. 2. Gently bend your fingers into your palm until the tips of your fingers are touching the middle of your palm. 3. Hold this position for __________ seconds. 4. Extend your fingers back to the starting position, stretching every  joint fully. Repeat this exercise 5-10 times with each hand. Straight fist 1. Stand or sit with your arm, hand, and all five fingers pointed straight up. Make sure to keep your wrist straight during the exercise. 2. Gently bend your fingers at the big knuckle, where your fingers meet your hand, and the middle knuckle. Keep the knuckle at the tips of your fingers straight and try to touch the bottom of your palm. 3. Hold this position for __________ seconds. 4. Extend your fingers back to the starting position, stretching every joint fully. Repeat this exercise 5-10 times with each hand. Tabletop 1. Stand or sit with your arm, hand, and all five fingers pointed straight up. Make sure to keep your wrist straight during the exercise. 2. Gently bend your fingers at the big knuckle, where your fingers meet your hand, as far down as you can while keeping the small knuckles in your fingers straight. Think of forming a tabletop with your fingers. 3. Hold this position for __________ seconds. 4. Extend your fingers back to the starting position, stretching every joint fully. Repeat this exercise 5-10 times with each hand. Finger spread 1. Place your hand flat on a table with your palm facing down. Make sure your wrist stays straight as you do this exercise. 2. Spread your fingers and thumb apart from each other as far as you can until you feel a gentle stretch. Hold this position for __________ seconds. 3. Bring your fingers and thumb tight together again. Hold this position for __________ seconds. Repeat this exercise 5-10 times with each hand.  Making circles 1. Stand or sit with your arm, hand, and all five fingers pointed straight up. Make sure to keep your wrist straight during the exercise. 2. Make a circle by touching the tip of your thumb to the tip of your index finger. 3. Hold for __________ seconds. Then open your hand wide. 4. Repeat this motion with your thumb and each finger on your  hand. Repeat this exercise 5-10 times with each hand. Thumb motion 1. Sit with your forearm resting on a table and your wrist straight. Your thumb should be facing up toward the ceiling. Keep your fingers relaxed as you move your thumb. 2. Lift your thumb up as high as you can toward the ceiling. Hold for __________ seconds. 3. Bend your thumb across your palm as far as you can, reaching the tip of your thumb for the small finger (pinkie) side of your palm. Hold for __________ seconds. Repeat this exercise 5-10 times with each hand. Grip strengthening  1. Hold a stress ball or other soft ball in the middle of your hand. 2. Slowly increase the pressure, squeezing the ball as much as you can without causing pain. Think of bringing the tips of your fingers into the middle of your palm. All of your finger joints should bend when doing this exercise. 3. Hold your squeeze for __________ seconds, then relax. Repeat this exercise 5-10 times with each hand. Contact a health care provider if:  Your hand pain or discomfort gets much worse when you do an exercise.  Your hand pain or discomfort does not improve within 2 hours after you exercise. If you have any of these problems, stop doing these exercises right away. Do not do them again unless your health care provider says that you can. Get help right away if:  You develop sudden, severe hand pain or swelling. If this happens, stop doing these exercises right away. Do not do them again unless your health care provider says that you can. This information is not intended to replace advice given to you by your health care provider. Make sure you discuss any questions you have with your health care provider. Document Revised: 07/07/2018 Document Reviewed: 03/17/2018 Elsevier Patient Education  [place].

## 2019-07-21 ENCOUNTER — Ambulatory Visit: Payer: BC Managed Care – PPO | Admitting: Rheumatology

## 2019-08-18 ENCOUNTER — Other Ambulatory Visit (HOSPITAL_COMMUNITY): Payer: Self-pay | Admitting: Obstetrics and Gynecology

## 2019-08-18 DIAGNOSIS — Z1231 Encounter for screening mammogram for malignant neoplasm of breast: Secondary | ICD-10-CM

## 2019-08-21 ENCOUNTER — Encounter: Payer: Self-pay | Admitting: Internal Medicine

## 2019-08-21 ENCOUNTER — Telehealth: Payer: Self-pay | Admitting: Internal Medicine

## 2019-08-21 NOTE — Telephone Encounter (Signed)
RECALL FOR ULTRASOUND 

## 2019-08-21 NOTE — Telephone Encounter (Signed)
Recall mailed 

## 2019-08-23 ENCOUNTER — Ambulatory Visit (HOSPITAL_COMMUNITY)
Admission: RE | Admit: 2019-08-23 | Discharge: 2019-08-23 | Disposition: A | Discharge status: Home / Self Care | Payer: BC Managed Care – PPO | Source: Ambulatory Visit | Attending: Obstetrics and Gynecology | Admitting: Obstetrics and Gynecology

## 2019-08-23 ENCOUNTER — Other Ambulatory Visit: Payer: Self-pay

## 2019-08-23 DIAGNOSIS — Z1231 Encounter for screening mammogram for malignant neoplasm of breast: Secondary | ICD-10-CM | POA: Insufficient documentation

## 2019-08-25 ENCOUNTER — Telehealth: Payer: Self-pay | Admitting: Internal Medicine

## 2019-08-25 DIAGNOSIS — K7581 Nonalcoholic steatohepatitis (NASH): Secondary | ICD-10-CM

## 2019-08-25 NOTE — Telephone Encounter (Signed)
[place] abd scheduled for 09/11/19 at 8:30am, arrive at 8:15am. NPO after midnight prior to test.   Called and informed pt of [place] appt. Letter mailed.

## 2019-08-25 NOTE — Telephone Encounter (Signed)
Pt received letter that it was time to schedule her [place]. [phone]

## 2019-09-11 ENCOUNTER — Ambulatory Visit (HOSPITAL_COMMUNITY)
Admission: RE | Admit: 2019-09-11 | Discharge: 2019-09-11 | Disposition: A | Discharge status: Home / Self Care | Payer: BC Managed Care – PPO | Source: Ambulatory Visit | Attending: Gastroenterology | Admitting: Gastroenterology

## 2019-09-11 ENCOUNTER — Other Ambulatory Visit: Payer: Self-pay

## 2019-09-11 DIAGNOSIS — K7581 Nonalcoholic steatohepatitis (NASH): Secondary | ICD-10-CM

## 2019-11-03 ENCOUNTER — Ambulatory Visit (INDEPENDENT_AMBULATORY_CARE_PROVIDER_SITE_OTHER): Payer: BC Managed Care – PPO | Admitting: Adult Health

## 2019-11-03 ENCOUNTER — Encounter: Payer: Self-pay | Admitting: Adult Health

## 2019-11-03 VITALS — BP 158/89 | HR 77 | Ht 60.0 in | Wt 172.0 lb

## 2019-11-03 DIAGNOSIS — I10 Essential (primary) hypertension: Secondary | ICD-10-CM

## 2019-11-03 DIAGNOSIS — Z90710 Acquired absence of both cervix and uterus: Secondary | ICD-10-CM

## 2019-11-03 DIAGNOSIS — Z01419 Encounter for gynecological examination (general) (routine) without abnormal findings: Secondary | ICD-10-CM

## 2019-11-03 DIAGNOSIS — Z1211 Encounter for screening for malignant neoplasm of colon: Secondary | ICD-10-CM | POA: Diagnosis not present

## 2019-11-03 LAB — HEMOCCULT GUIAC POC 1CARD (OFFICE): Fecal Occult Blood, POC: NEGATIVE

## 2019-11-03 NOTE — Progress Notes (Signed)
Patient ID: Loretta Schaefer, female   DOB: 1971/02/11, 49 y.o.   MRN: [id number] History of Present Illness: Loretta Schaefer is a 49 year old white female,married, sp hysterectomy in for a well woman gyn exam. PCP is Dr Woody Seller.   Current Medications, Allergies, Past Medical History, Past Surgical History, Family History and Social History were reviewed in [employer] record.     Review of Systems: Patient denies any headaches, hearing loss, fatigue, blurred vision, shortness of breath, chest pain, abdominal pain, problems with bowel movements, urination, or intercourse. No joint pain or mood swings. Denies any hot flashes, has increased hair on chin Has some anxiety over up coming trip to [place] and riding a train    Physical Exam:BP (!) 158/89 (BP Location: Right Arm, Patient Position: Sitting, Cuff Size: Normal)   Pulse 77   Ht 5' (1.524 m)   Wt 172 lb (78 kg)   BMI 33.59 kg/m  General:  Well developed, well nourished, no acute distress Skin:  Warm and dry Neck:  Midline trachea, normal thyroid, good ROM, no lymphadenopathy Lungs; Clear to auscultation bilaterally Breast:  No dominant palpable mass, retraction, or nipple discharge Cardiovascular: Regular rate and rhythm Abdomen:  Soft, non tender, no hepatosplenomegaly Pelvic:  External genitalia is normal in appearance, no lesions.  The vagina is normal in appearance. Urethra has no lesions or masses. The cervix and uterus are absent.No adnexal masses or tenderness noted.Bladder is non tender, no masses felt. Rectal: Good sphincter tone, no polyps, or hemorrhoids felt.  Hemoccult negative. Extremities/musculoskeletal:  No swelling or varicosities noted, no clubbing or cyanosis Psych:  No mood changes, alert and cooperative,seems happy AA is 0 Fall risk is low PHQ 9 score is 7, no SI, on Wellbutrin. Examination chaperoned by Dwyane Dee LPN.   Impression and Plan: 1. Encounter for well woman exam with routine  gynecological exam Physical in 1 year Mammogram yearly Labs with PCP Colonoscopy per GI  2. Encounter for screening fecal occult blood testing   3. S/P hysterectomy   4. Hypertension, unspecified type Follow up with PCP first September

## 2019-11-23 ENCOUNTER — Telehealth: Payer: Self-pay | Admitting: Adult Health

## 2019-11-23 ENCOUNTER — Encounter: Payer: Self-pay | Admitting: Adult Health

## 2019-11-23 ENCOUNTER — Ambulatory Visit: Payer: BC Managed Care – PPO | Admitting: Adult Health

## 2019-11-23 VITALS — BP 149/90 | HR 108 | Ht 60.0 in | Wt 170.0 lb

## 2019-11-23 DIAGNOSIS — N764 Abscess of vulva: Secondary | ICD-10-CM

## 2019-11-23 MED ORDER — SULFAMETHOXAZOLE-TRIMETHOPRIM 800-160 MG PO TABS: 1.0000 | ORAL_TABLET | Freq: Two times a day (BID) | ORAL | 0 refills | Status: DC

## 2019-11-23 NOTE — Patient Instructions (Signed)
Skin Abscess  A skin abscess is an infected area on or under your skin that contains a collection of pus and other material. An abscess may also be called a furuncle, carbuncle, or boil. An abscess can occur in or on almost any part of your body. Some abscesses break open (rupture) on their own. Most continue to get worse unless they are treated. The infection can spread deeper into the body and eventually into your blood, which can make you feel ill. Treatment usually involves draining the abscess. What are the causes? An abscess occurs when germs, like bacteria, pass through your skin and cause an infection. This may be caused by:  A scrape or cut on your skin.  A puncture wound through your skin, including a needle injection or insect bite.  Blocked oil or sweat glands.  Blocked and infected hair follicles.  A cyst that forms beneath your skin (sebaceous cyst) and becomes infected. What increases the risk? This condition is more likely to develop in people who:  Have a weak body defense system (immune system).  Have diabetes.  Have dry and irritated skin.  Get frequent injections or use illegal IV drugs.  Have a foreign body in a wound, such as a splinter.  Have problems with their lymph system or veins. What are the signs or symptoms? Symptoms of this condition include:  A painful, firm bump under the skin.  A bump with pus at the top. This may break through the skin and drain. Other symptoms include:  Redness surrounding the abscess site.  Warmth.  Swelling of the lymph nodes (glands) near the abscess.  Tenderness.  A sore on the skin. How is this diagnosed? This condition may be diagnosed based on:  A physical exam.  Your medical history.  A sample of pus. This may be used to find out what is causing the infection.  Blood tests.  Imaging tests, such as an ultrasound, CT scan, or MRI. How is this treated? A small abscess that drains on its own may  not need treatment. Treatment for larger abscesses may include:  Moist heat or heat pack applied to the area several times a day.  A procedure to drain the abscess (incision and drainage).  Antibiotic medicines. For a severe abscess, you may first get antibiotics through an IV and then change to antibiotics by mouth. Follow these instructions at home: Medicines   Take over-the-counter and prescription medicines only as told by your health care provider.  If you were prescribed an antibiotic medicine, take it as told by your health care provider. Do not stop taking the antibiotic even if you start to feel better. Abscess care   If you have an abscess that has not drained, apply heat to the affected area. Use the heat source that your health care provider recommends, such as a moist heat pack or a heating pad. ? Place a towel between your skin and the heat source. ? Leave the heat on for 20-30 minutes. ? Remove the heat if your skin turns bright red. This is especially important if you are unable to feel pain, heat, or cold. You may have a greater risk of getting burned.  Follow instructions from your health care provider about how to take care of your abscess. Make sure you: ? Cover the abscess with a bandage (dressing). ? Change your dressing or gauze as told by your health care provider. ? Wash your hands with soap and water before you change the   dressing or gauze. If soap and water are not available, use hand sanitizer.  Check your abscess every day for signs of a worsening infection. Check for: ? More redness, swelling, or pain. ? More fluid or blood. ? Warmth. ? More pus or a bad smell. General instructions  To avoid spreading the infection: ? Do not share personal care items, towels, or hot tubs with others. ? Avoid making skin contact with other people.  Keep all follow-up visits as told by your health care provider. This is important. Contact a health care provider if  you have:  More redness, swelling, or pain around your abscess.  More fluid or blood coming from your abscess.  Warm skin around your abscess.  More pus or a bad smell coming from your abscess.  A fever.  Muscle aches.  Chills or a general ill feeling. Get help right away if you:  Have severe pain.  See red streaks on your skin spreading away from the abscess. Summary  A skin abscess is an infected area on or under your skin that contains a collection of pus and other material.  A small abscess that drains on its own may not need treatment.  Treatment for larger abscesses may include having a procedure to drain the abscess and taking an antibiotic. This information is not intended to replace advice given to you by your health care provider. Make sure you discuss any questions you have with your health care provider. Document Revised: 07/07/2018 Document Reviewed: 04/29/2017 Elsevier Patient Education  2020 [employer]  

## 2019-11-23 NOTE — Progress Notes (Signed)
°  Subjective:     Patient ID: Loretta Schaefer, female   DOB: 27-Sep-1970, 49 y.o.   MRN: [id number]  HPI Loretta Schaefer is a 49 year old white female, married, sp hysterectomy worked in for bump right inner labia that is tender.Found this morning in the shower. PCP is Dr Woody Seller   Review of Systems Bump right inner labia that is tender Reviewed past medical,surgical, social and family history. Reviewed medications and allergies.     Objective:   Physical Exam BP (!) 149/90 (BP Location: Left Arm, Patient Position: Sitting, Cuff Size: Normal)    Pulse (!) 108    Ht 5' (1.524 m)    Wt 170 lb (77.1 kg)    BMI 33.20 kg/m    Skin warm and dry.Pelvic: external genitalia is normal in appearance, has pea sized area right inner labia when gently squeezed expressed yellow cheezy material. It was tender, when squeezed. Examination chaperoned by Dwyane Dee LPN  [facility] - 93/23/55 1209      Pregnancy Intention Screening   Does the patient want to become pregnant in the next year? No    Does the patient's partner want to become pregnant in the next year? No    Would the patient like to discuss contraceptive options today? No      Contraception Wrap Up   Current Method No Method - Other Reason   hyst   End Method --   hyst   Contraception Counseling Provided No          Assessment:     1. Boil, vulva Use warm compresses and can gently apply pressure  Will Rx septra ds Meds ordered this encounter  Medications   sulfamethoxazole-trimethoprim (BACTRIM DS) 800-160 MG tablet    Sig: Take 1 tablet by mouth 2 (two) times daily. Take 1 bid    Dispense:  28 tablet    Refill:  0    Order Specific Question:   Supervising Provider    Answer:   Tania Ade H [[id number]]   -review handout on skin abscess     Plan:     Follow up prn

## 2019-11-23 NOTE — Telephone Encounter (Signed)
Patient called stating that she would like a call back from [place], patient did not state the reason for the call please contact pt

## 2019-11-23 NOTE — Telephone Encounter (Signed)
Has bump inside of right labia, looks like white head, it is irritated if wipes, felt like popped up over night, she feels better if it is checked to come in at 12N

## 2019-11-23 NOTE — Telephone Encounter (Signed)
Telephoned patient at home number and patient states when taking shower last night noticed what looked to be a white head inside the vaginal area. Patient is not experiencing any pain or soreness. Patient states would like call back from Estill Dooms, NP.

## 2020-02-27 ENCOUNTER — Encounter: Payer: Self-pay | Admitting: Internal Medicine

## 2020-04-01 ENCOUNTER — Other Ambulatory Visit: Payer: Self-pay | Admitting: Gastroenterology

## 2020-04-26 ENCOUNTER — Other Ambulatory Visit: Payer: Self-pay

## 2020-04-26 ENCOUNTER — Ambulatory Visit: Payer: BC Managed Care – PPO | Admitting: Internal Medicine

## 2020-04-26 ENCOUNTER — Encounter: Payer: Self-pay | Admitting: Internal Medicine

## 2020-04-26 VITALS — BP 156/83 | HR 80 | Temp 97.8°F | Ht 60.0 in | Wt 177.2 lb

## 2020-04-26 DIAGNOSIS — K7581 Nonalcoholic steatohepatitis (NASH): Secondary | ICD-10-CM | POA: Diagnosis not present

## 2020-04-26 DIAGNOSIS — K219 Gastro-esophageal reflux disease without esophagitis: Secondary | ICD-10-CM

## 2020-04-26 NOTE — Patient Instructions (Signed)
Continue Protonix 40 mg daily for GERD.   Continue vitamin E 1 to 2 capsules daily  Exercise and weight loss continue to be recommended  Drinking coffee regularly is also good for your liver  Okay to try a statin if needed for hyperlipidemia.  We will just need to check liver enzymes closely after starting this medication  Need to review recent records done by your primary care physician.  I would like to see your recent comprehensive metabolic profile to include liver enzymes.  Liver ultrasound in 6 months  We will make contact with you in 6 months to set up a colonoscopy for the fall of this year.  Appointment back in the office here in 1 year  Good job on weight loss.  Please try and lose another 6 pounds as this year progresses.

## 2020-04-26 NOTE — Progress Notes (Signed)
Primary Care Physician:  Glenda Chroman, MD Primary Gastroenterologist:  Dr. Gala Romney  Pre-Procedure History & Physical: HPI:  Loretta Schaefer is a 50 y.o. female here for follow-up of-and GERD.  She is lost 6 pounds since she was here previously.  Low med Avera score previously.  Platelet count remains very much within normal range.  GERD well controlled on Protonix 40 mg daily has trouble getting much exercise.  She is due for a high rescreening colonoscopy later this year given family history of colon polyps.  She continues take vitamin D daily.  Past Medical History:  Diagnosis Date  . Anxiety   . Condyloma   . Depression   . Fatty liver   . Fibrocystic changes of left breast 05/29/2016  . GERD (gastroesophageal reflux disease)   . History of pyelonephritis 2003   right  . Hyperplastic colon polyp 06/25/10   colonoscopy by Dr. Gala Romney  . Hypertension    history of,is not on any medicqtions  . Peri-menopause 02/06/2014  . Prediabetes   . RA (rheumatoid arthritis) ([place])   . Steatohepatitis 2000   without cirrhosis. Completed Hep A/B vaccinations    Past Surgical History:  Procedure Laterality Date  . ABDOMINAL HYSTERECTOMY    . BREAST BIOPSY Left 03/2016   [place]     x 2  . COLONOSCOPY  05/2010   normal TI, hyperplastic rectal polyp, friable anal canal  . COLONOSCOPY N/A 07/11/2015   One 5 mm polyp in the descending colon, s/p resection and retrieval. Internal hemorrhoids. Hyperplastic polyp. Surveillance 2022.   . ESOPHAGOGASTRODUODENOSCOPY  05/2010   distal ERE, small hh  . ESOPHAGOGASTRODUODENOSCOPY N/A 11/13/2013   NWG:NFAOZ hiatal hernia; otherwise, negative EGD.Nostigmata of cirrhosis foundPatient may not have cirrhosis at  . FOOT SURGERY    . LIVER BIOPSY  2000's   [facility] [facility]  . PANNICULECTOMY  2005  . RADIOACTIVE SEED GUIDED EXCISIONAL BREAST BIOPSY Left 10/08/2016   Procedure: RADIOACTIVE SEED GUIDED EXCISIONAL LEFT  BREAST  BIOPSY;  Surgeon: Rolm Bookbinder, MD;  Location: [place] [place];  Service: General;  Laterality: Left;  . S/P Hysterectomy  2005  . TOE SURGERY     surgery on left big toe at joint  . WISDOM TOOTH EXTRACTION      Prior to Admission medications   Medication Sig Start Date End Date Taking? Authorizing Provider  ALPRAZolam Duanne Moron) 0.5 MG tablet Take 0.5 mg by mouth at bedtime as needed for anxiety.   Yes [provider]  buPROPion (WELLBUTRIN XL) 150 MG 24 hr tablet Take 150 mg by mouth daily.   Yes [provider]  ibuprofen (ADVIL,MOTRIN) 200 MG tablet Take 400 mg by mouth 2 (two) times daily as needed for headache.   Yes [provider]  loratadine (CLARITIN) 10 MG tablet Take 10 mg by mouth daily.   Yes [provider]  metFORMIN (GLUCOPHAGE) 500 MG tablet Take 500 mg by mouth daily. 06/13/19  Yes [provider]  pantoprazole (PROTONIX) 40 MG tablet TAKE 1 TABLET BY MOUTH EVERY DAY 30 MINUTES BEFORE BREAK 04/03/20  Yes Erenest Rasher, PA-C  Vitamin D, Ergocalciferol, (DRISDOL) 1.25 MG (50000 UNIT) CAPS capsule Take 50,000 Units by mouth once a week. 06/11/19  Yes [provider]  vitamin E 400 UNIT capsule Take 400 Units by mouth daily.   Yes [provider]    Allergies as of 04/26/2020 - Review Complete 04/26/2020  Allergen Reaction  Noted  . Omnipaque [iohexol] Hives and Itching 06/02/2010    Family History  Problem Relation Age of Onset  . Colon polyps Mother        less than age 24  . Cirrhosis Mother        NASH, deceased  . Depression Mother   . Diabetes Mother   . Heart disease Mother   . Cirrhosis Maternal Aunt        NASH  . Bipolar disorder Sister   . Asthma Sister   . Depression Sister        bipolar  . COPD Sister   . Asthma Brother   . Diabetes Sister   . Hypertension Sister   . Other Sister        degenerative disc in back  . Supraventricular tachycardia Son   . Asthma Son   . Heart attack Father    . COPD Father   . Emphysema Father   . Dementia Father     Social History   Socioeconomic History  . Marital status: Married    Spouse name: Not on file  . Number of children: Not on file  . Years of education: Not on file  . Highest education level: Not on file  Occupational History  . Not on file  Tobacco Use  . Smoking status: Never Smoker  . Smokeless tobacco: Never Used  Vaping Use  . Vaping Use: Never used  Substance and Sexual Activity  . Alcohol use: No  . Drug use: No  . Sexual activity: Yes    Birth control/protection: Surgical    Comment: hyst  Other Topics Concern  . Not on file  Social History Narrative  . Not on file   Social Determinants of Health   Financial Resource Strain: Low Risk   . Difficulty of Paying Living Expenses: Not hard at all  Food Insecurity: No Food Insecurity  . Worried About Charity fundraiser in the Last Year: Never true  . Ran Out of Food in the Last Year: Never true  Transportation Needs: No Transportation Needs  . Lack of Transportation (Medical): No  . Lack of Transportation (Non-Medical): No  Physical Activity: Inactive  . Days of Exercise per Week: 0 days  . Minutes of Exercise per Session: 0 min  Stress: Stress Concern Present  . Feeling of Stress : To some extent  Social Connections: Moderately Isolated  . Frequency of Communication with Friends and Family: More than three times a week  . Frequency of Social Gatherings with Friends and Family: More than three times a week  . Attends Religious Services: Never  . Active Member of Clubs or Organizations: No  . Attends Archivist Meetings: Never  . Marital Status: Married  Human resources officer Violence: Not At Risk  . Fear of Current or Ex-Partner: No  . Emotionally Abused: No  . Physically Abused: No  . Sexually Abused: No    Review of Systems: See HPI, otherwise negative ROS  Physical Exam: BP (!) 156/83   Pulse 80   Temp 97.8 F (36.6 C)   Ht 5'  (1.524 m)   Wt 177 lb 3.2 oz (80.4 kg)   BMI 34.61 kg/m  General:   Alert,   pleasant and cooperative in NAD Skin:  Intact without significant lesions or rashes. Neck:  Supple; no masses or thyromegaly. No significant cervical adenopathy. Lungs:  Clear throughout to auscultation.   No wheezes, crackles, or rhonchi. No acute distress. Heart:  Regular rate and rhythm; no murmurs, clicks, rubs,  or gallops. Abdomen: Non-distended, normal bowel sounds.  Soft and nontender without appreciable mass or hepatosplenomegaly.  Pulses:  Normal pulses noted. Extremities:  Without clubbing or edema.  Impression/Plan: 50 year old with [place]/without increased fibrosis.  Doing very well.  6 pound weight loss since last visit.  GERD well controlled on Protonix. Positive family history of colon adenomas and first-degree relative at a young age; due for surveillance colonoscopy sometime this year  Recommendations:   Continue Protonix 40 mg daily for GERD.   Continue vitamin E 1 to 2 capsules daily  Exercise and weight loss continue to be recommended  Drinking coffee regularly is also good for the liver  Okay to try a statin if needed for hyperlipidemia.  We will just need to check liver enzymes closely after starting this medication  Need to review recent records done by primary care physician.  I would like to see a recent comprehensive metabolic profile to include liver enzymes.  Liver ultrasound in 6 months  We will make contact in 6 months to set up a colonoscopy for the fall of this year.  Appointment back in the office here in 1 year; will follow her liver disease yearly with platelet counts and metavir/kilopascals. And labs. So, as long as kilopascals do not exceed 20 and platelet count does not drop below 150,000 she will not need a future screening EGD.    Notice: This dictation was prepared with Dragon dictation along with smaller phrase technology. Any transcriptional errors that  result from this process are unintentional and may not be corrected upon review.

## 2020-05-07 ENCOUNTER — Telehealth: Payer: Self-pay

## 2020-05-07 NOTE — Telephone Encounter (Signed)
Pt sent a mychart message and would like to know if Biotin is ok to take. Pts to make sure it won't hurt her liver. Please advise.

## 2020-05-08 NOTE — Telephone Encounter (Signed)
So, if taking a balanced diet, should be getting adequate amounts of biotin.  If additional is desired, would take a centrum multivitamin daily.  I don't recommend supplementation beyond that.

## 2020-05-08 NOTE — Telephone Encounter (Signed)
Noted. Spoke with pt. Pt was notified of Dr. Coralee North recommendations.

## 2020-06-06 ENCOUNTER — Encounter: Payer: Self-pay | Admitting: Internal Medicine

## 2020-06-07 NOTE — Progress Notes (Signed)
Office Visit Note  Patient: Loretta Schaefer             Date of Birth: [birth year]           MRN: [id number]             PCP: Glenda Chroman, MD Referring: Glenda Chroman, MD Visit Date: 06/21/2020 Occupation: @GUAROCC @  Subjective:  Pain in both hands.   History of Present Illness: Loretta Schaefer is a 50 y.o. female with history of osteoarthritis and positive rheumatoid factor.  She states she continues to have pain and stiffness in her hands.  She is not showing any joint swelling.  She still has nodule on her right index finger which is painful when she holds objects.  None of the other joints are painful.  Activities of Daily Living:  Patient reports morning stiffness for 5-10 minutes.   Patient Denies nocturnal pain.  Difficulty dressing/grooming: Denies Difficulty climbing stairs: Denies Difficulty getting out of chair: Denies Difficulty using hands for taps, buttons, cutlery, and/or writing: Reports  Review of Systems  Constitutional: Positive for fatigue.  HENT: Negative for mouth sores, mouth dryness and nose dryness.   Eyes: Negative for pain, itching and dryness.  Respiratory: Negative for shortness of breath and difficulty breathing.   Cardiovascular: Negative for chest pain and palpitations.  Gastrointestinal: Negative for blood in stool, constipation and diarrhea.  Endocrine: Negative for increased urination.  Genitourinary: Negative for difficulty urinating.  Musculoskeletal: Positive for arthralgias, joint pain, myalgias, morning stiffness and myalgias. Negative for joint swelling and muscle tenderness.  Skin: Positive for redness. Negative for color change and rash.  Allergic/Immunologic: Negative for susceptible to infections.  Neurological: Negative for dizziness, numbness, headaches, memory loss and weakness.  Hematological: Negative for bruising/bleeding tendency.  Psychiatric/Behavioral: Negative for confusion.    PMFS History:  Patient Active Problem  List   Diagnosis Date Noted  . Boil, vulva 11/23/2019  . Encounter for screening fecal occult blood testing 11/03/2019  . Encounter for well woman exam with routine gynecological exam 11/03/2019  . S/P hysterectomy 11/03/2019  . Well woman exam with routine gynecological exam 06/11/2017  . Depression 06/11/2017  . Abnormal mammogram of left breast 07/03/2016  . Body mass index 34.0-34.9, adult 07/03/2016  . Weight loss counseling, encounter for 07/03/2016  . Fibrocystic changes of left breast 05/29/2016  . History of colonic polyps 07/03/2015  . Peri-menopause 02/06/2014  . Condyloma 07/05/2012  . Anxiety 04/14/2012  . GERD (gastroesophageal reflux disease) 01/14/2011  . NASH (nonalcoholic steatohepatitis) 06/11/2010    Past Medical History:  Diagnosis Date  . Anxiety   . Condyloma   . Depression   . Fatty liver   . Fibrocystic changes of left breast 05/29/2016  . GERD (gastroesophageal reflux disease)   . History of pyelonephritis 2003   right  . Hyperplastic colon polyp 06/25/10   colonoscopy by Dr. Gala Romney  . Hypertension    history of,is not on any medicqtions  . Peri-menopause 02/06/2014  . Prediabetes   . RA (rheumatoid arthritis) ([place])   . Steatohepatitis 2000   without cirrhosis. Completed Hep A/B vaccinations    Family History  Problem Relation Age of Onset  . Colon polyps Mother        less than age 53  . Cirrhosis Mother        NASH, deceased  . Depression Mother   . Diabetes Mother   . Heart disease Mother   . Cirrhosis Maternal Aunt  NASH  . Bipolar disorder Sister   . Asthma Sister   . Depression Sister        bipolar  . COPD Sister   . Asthma Brother   . Diabetes Sister   . Hypertension Sister   . Other Sister        degenerative disc in back  . Supraventricular tachycardia Son   . Asthma Son   . Heart attack Father   . COPD Father   . Emphysema Father   . Dementia Father    Past Surgical History:  Procedure Laterality Date  .  ABDOMINAL HYSTERECTOMY    . BREAST BIOPSY Left 03/2016   [place]     x 2  . COLONOSCOPY  05/2010   normal TI, hyperplastic rectal polyp, friable anal canal  . COLONOSCOPY N/A 07/11/2015   One 5 mm polyp in the descending colon, s/p resection and retrieval. Internal hemorrhoids. Hyperplastic polyp. Surveillance 2022.   . ESOPHAGOGASTRODUODENOSCOPY  05/2010   distal ERE, small hh  . ESOPHAGOGASTRODUODENOSCOPY N/A 11/13/2013   DQQ:IWLNL hiatal hernia; otherwise, negative EGD.Nostigmata of cirrhosis foundPatient may not have cirrhosis at  . FOOT SURGERY    . LIVER BIOPSY  2000's   [facility]  . PANNICULECTOMY  2005  . RADIOACTIVE SEED GUIDED EXCISIONAL BREAST BIOPSY Left 10/08/2016   Procedure: RADIOACTIVE SEED GUIDED EXCISIONAL LEFT  BREAST BIOPSY;  Surgeon: Rolm Bookbinder, MD;  Location: [place] [facility];  Service: General;  Laterality: Left;  . S/P Hysterectomy  2005  . TOE SURGERY     surgery on left big toe at joint  . WISDOM TOOTH EXTRACTION     Social History   Social History Narrative  . Not on file   Immunization History  Administered Date(s) Administered  . Influenza-Unspecified 01/24/2015     Objective: Vital Signs: BP (!) 171/102 (BP Location: Left Arm, Patient Position: Sitting, Cuff Size: Normal)   Pulse 76   Resp 13   Ht 5' (1.524 m)   Wt 183 lb 9.6 oz (83.3 kg)   BMI 35.86 kg/m    Physical Exam Vitals and nursing note reviewed.  Constitutional:      Appearance: She is well-developed.  HENT:     Head: Normocephalic and atraumatic.  Eyes:     Conjunctiva/sclera: Conjunctivae normal.  Cardiovascular:     Rate and Rhythm: Normal rate and regular rhythm.     Heart sounds: Normal heart sounds.  Pulmonary:     Effort: Pulmonary effort is normal.     Breath sounds: Normal breath sounds.  Abdominal:     General: Bowel sounds are normal.     Palpations: Abdomen is soft.  Musculoskeletal:     Cervical back: Normal range of  motion.  Lymphadenopathy:     Cervical: No cervical adenopathy.  Skin:    General: Skin is warm and dry.     Capillary Refill: Capillary refill takes less than 2 seconds.     Comments: Subcutaneous nodule was palpable over her right index finger (distal phalanx) on the palmar aspect.  Neurological:     Mental Status: She is alert and oriented to person, place, and time.  Psychiatric:        Behavior: Behavior normal.      Musculoskeletal Exam: C-spine thoracic and lumbar spine with good range of motion.  Shoulder joints, elbow joints, wrist joints, MCPs PIPs and DIPs with good range of motion with no synovitis.  She has mild  PIP and DIP prominence but no synovitis was noted.  Hip joints, knee joints, ankles, MTPs and PIPs with good range of motion with no synovitis.  CDAI Exam: CDAI Score: - Patient Global: -; Provider Global: - Swollen: -; Tender: - Joint Exam 06/21/2020   No joint exam has been documented for this visit   There is currently no information documented on the homunculus. Go to the Rheumatology activity and complete the homunculus joint exam.  Investigation: No additional findings.  Imaging: No results found.  Recent Labs: Lab Results  Component Value Date   WBC 8.2 10/02/2016   HGB 14.2 10/02/2016   PLT 253 10/02/2016   NA 135 10/02/2016   K 4.0 10/02/2016   CL 104 10/02/2016   CO2 23 10/02/2016   GLUCOSE 93 10/02/2016   BUN 9 10/02/2016   CREATININE 0.87 10/02/2016   BILITOT 0.8 10/02/2016   ALKPHOS 48 10/02/2016   AST 27 10/02/2016   ALT 37 10/02/2016   PROT 7.0 10/02/2016   ALBUMIN 4.2 10/02/2016   CALCIUM 9.3 10/02/2016   GFRAA >60 10/02/2016    Speciality Comments: No specialty comments available.  Procedures:  No procedures performed Allergies: Omnipaque [iohexol]   Assessment / Plan:     Visit Diagnoses: Pain in both hands - The x-ray findings were consistent with osteoarthritis.  Patient complains of ongoing pain and discomfort in  her hands.  No synovitis was noted.  She has DIP and PIP prominence.  No MCP or wrist joint tenderness was noted.  Nodule of finger of right hand-she has persistent subcutaneous nodule over her right finger distal phalanx on the palmar aspect.  It has been interfering with her routine activities such as tender.  I will refer her to hand surgeon for resection and pathology to rule out rheumatoid nodule.  Rheumatoid factor positive-she had no synovitis.  Other medical problems are listed as follows:  Other fatigue  Elevated cholesterol  NASH (nonalcoholic steatohepatitis)  Essential hypertension-her blood pressure reading is high today.  Have advised her to monitor blood pressure closely and follow-up with her PCP.  Peri-menopause  Fibrocystic changes of left breast  History of colonic polyps  Gastroesophageal reflux disease without esophagitis  Anxiety and depression  Orders: No orders of the defined types were placed in this encounter.  No orders of the defined types were placed in this encounter.   Follow-Up Instructions: Return in about 5 months (around 11/21/2020) for +RF, OA.   Bo Merino, MD  Note - This record has been created using Editor, commissioning.  Chart creation errors have been sought, but may not always  have been located. Such creation errors do not reflect on  the standard of medical care.

## 2020-06-21 ENCOUNTER — Ambulatory Visit: Payer: BC Managed Care – PPO | Admitting: Rheumatology

## 2020-06-21 ENCOUNTER — Other Ambulatory Visit: Payer: Self-pay

## 2020-06-21 ENCOUNTER — Encounter: Payer: Self-pay | Admitting: Rheumatology

## 2020-06-21 VITALS — BP 171/102 | HR 76 | Resp 13 | Ht 60.0 in | Wt 183.6 lb

## 2020-06-21 DIAGNOSIS — R5383 Other fatigue: Secondary | ICD-10-CM

## 2020-06-21 DIAGNOSIS — M79641 Pain in right hand: Secondary | ICD-10-CM

## 2020-06-21 DIAGNOSIS — N6012 Diffuse cystic mastopathy of left breast: Secondary | ICD-10-CM

## 2020-06-21 DIAGNOSIS — K219 Gastro-esophageal reflux disease without esophagitis: Secondary | ICD-10-CM

## 2020-06-21 DIAGNOSIS — R2231 Localized swelling, mass and lump, right upper limb: Secondary | ICD-10-CM

## 2020-06-21 DIAGNOSIS — E7800 Pure hypercholesterolemia, unspecified: Secondary | ICD-10-CM

## 2020-06-21 DIAGNOSIS — M79642 Pain in left hand: Secondary | ICD-10-CM

## 2020-06-21 DIAGNOSIS — I10 Essential (primary) hypertension: Secondary | ICD-10-CM

## 2020-06-21 DIAGNOSIS — Z86010 Personal history of colonic polyps: Secondary | ICD-10-CM

## 2020-06-21 DIAGNOSIS — K7581 Nonalcoholic steatohepatitis (NASH): Secondary | ICD-10-CM

## 2020-06-21 DIAGNOSIS — F32A Depression, unspecified: Secondary | ICD-10-CM

## 2020-06-21 DIAGNOSIS — R768 Other specified abnormal immunological findings in serum: Secondary | ICD-10-CM

## 2020-06-21 DIAGNOSIS — F419 Anxiety disorder, unspecified: Secondary | ICD-10-CM

## 2020-06-21 DIAGNOSIS — N951 Menopausal and female climacteric states: Secondary | ICD-10-CM

## 2020-06-21 NOTE — Addendum Note (Signed)
Addended by: Francis Gaines C on: 06/21/2020 11:00 AM   Modules accepted: Orders

## 2020-06-26 ENCOUNTER — Telehealth: Payer: Self-pay | Admitting: Internal Medicine

## 2020-06-26 NOTE — Telephone Encounter (Signed)
Nurse visit

## 2020-06-26 NOTE — Telephone Encounter (Signed)
DOES PATIENT NEED OV OR NURSE FOR TCS

## 2020-07-08 ENCOUNTER — Ambulatory Visit (INDEPENDENT_AMBULATORY_CARE_PROVIDER_SITE_OTHER): Payer: Self-pay | Admitting: *Deleted

## 2020-07-08 ENCOUNTER — Other Ambulatory Visit: Payer: Self-pay

## 2020-07-08 VITALS — Ht 61.0 in | Wt 181.2 lb

## 2020-07-08 DIAGNOSIS — Z86010 Personal history of colonic polyps: Secondary | ICD-10-CM

## 2020-07-08 MED ORDER — CLENPIQ 10-3.5-12 MG-GM -GM/160ML PO SOLN: 1.0000 | Freq: Once | ORAL | 0 refills | Status: AC

## 2020-07-08 NOTE — Progress Notes (Signed)
Gastroenterology Pre-Procedure Review  Request Date: 07/08/2020 Requesting Physician: 5 year recall, Last TCS 07/11/2015 done by Dr. Gala Romney, hyperplastic polyp, personal hx of colonic polyps  PATIENT REVIEW QUESTIONS: The patient responded to the following health history questions as indicated:    1. Diabetes Melitis: yes, pre-diabetic 2. Joint replacements in the past 12 months: no 3. Major health problems in the past 3 months: yes, bp elevated, taking medicine for it 4. Has an artificial valve or MVP: no 5. Has a defibrillator: no 6. Has been advised in past to take antibiotics in advance of a procedure like teeth cleaning: no 7. Family history of colon cancer: no  8. Alcohol Use: no 9. Illicit drug Use: no 10. History of sleep apnea: no  11. History of coronary artery or other vascular stents placed within the last 12 months: no 12. History of any prior anesthesia complications: no 13. Body mass index is 34.24 kg/m.    MEDICATIONS & ALLERGIES:    Patient reports the following regarding taking any blood thinners:   Plavix? no Aspirin? no Coumadin? no Brilinta? no Xarelto? no Eliquis? no Pradaxa? no Savaysa? no Effient? no  Patient confirms/reports the following medications:  Current Outpatient Medications  Medication Sig Dispense Refill  . ALPRAZolam (XANAX) 0.5 MG tablet Take 0.5 mg by mouth at bedtime as needed for anxiety.    Marland Kitchen buPROPion (WELLBUTRIN XL) 150 MG 24 hr tablet Take 150 mg by mouth daily.    Marland Kitchen ibuprofen (ADVIL,MOTRIN) 200 MG tablet Take 400 mg by mouth 2 (two) times daily as needed for headache.    . loratadine (CLARITIN) 10 MG tablet Take 10 mg by mouth daily.    . metFORMIN (GLUCOPHAGE) 500 MG tablet Take 500 mg by mouth daily.    . metoprolol succinate (TOPROL-XL) 25 MG 24 hr tablet Take 1 tablet by mouth daily.    . Omega-3 Fatty Acids (FISH OIL PO) Take 1,000 mg by mouth daily.    . pantoprazole (PROTONIX) 40 MG tablet TAKE 1 TABLET BY MOUTH EVERY DAY 30  MINUTES BEFORE BREAK 30 tablet 3  . Vitamin D, Ergocalciferol, (DRISDOL) 1.25 MG (50000 UNIT) CAPS capsule Take 50,000 Units by mouth once a week.    . vitamin E 400 UNIT capsule Take 400 Units by mouth daily.     No current facility-administered medications for this visit.    Patient confirms/reports the following allergies:  Allergies  Allergen Reactions  . Omnipaque [Iohexol] Hives and Itching    Pt needs pre meds    No orders of the defined types were placed in this encounter.   AUTHORIZATION INFORMATION Primary Insurance: [place],  [place] #: [id number],  Group #: [id number] Pre-Cert / Josem Kaufmann required:  Pre-Cert / Josem Kaufmann #:   SCHEDULE INFORMATION: Procedure has been scheduled as follows:  Date: , Time:  Location: [facility] with Dr. Gala Romney  This Gastroenterology Pre-Precedure Review Form is being routed to the following provider(s): Roseanne Kaufman, NP

## 2020-07-08 NOTE — Patient Instructions (Signed)
[place]   Patient Name:  Loretta Schaefer Date of procedure:   Time to register at [facility]: You will receive a call from the hospital a few days before your procedure. Provider:  Dr. Gala Romney  Please notify us immediately if you are diabetic, take iron supplements, or if you are on coumadin or any blood thinners.  Please hold the following medications: See letter.  Note: Do NOT refrigerate or freeze CLENPIQ. CLENPIQ is ready to drink. There is no need to add any other liquid or mix the medicine in the bottle before you start dosing.     1 Day prior to procedure:     CLEAR LIQUIDS ALL DAY--NO SOLID FOODS OR DAIRY PRODUCTS! See list of liquids that are allowed and items that are NOT allowed below.   Diabetic Medication Instructions:  See letter.   You must drink plenty of CLEAR LIQUIDS starting before your bowel prep. It is important to stay adequately hydrated before, during, and after your bowel prep for the prep to work effectively!   At 4:00 PM Begin the prep as follows:    1. Drink one bottle of pre-mixed CLENPIQ right from the bottle.  2. Drink at least five (5) 8-ounce drinks of clear liquids of your choice within the next 5 hours   At 10:00 PM: 1. Drink the second bottle of pre-mixed CLENPIQ right from the bottle.   2. Drink at least three (3) 8-ounce drinks of clear liquids of your choice within the next 3 hours before going to bed.   Continue clear liquids until midnight.  Nothing by mouth after midnight.    Day of Procedure:   Diabetic medications adjustments:  See letter   You may take TYLENOL products.  Please continue your regular medications unless we have instructed you otherwise.     Please note, on the day of your procedure you MUST be accompanied by an adult who is willing to assume responsibility for you at time of discharge. If you do not have such person with you, your procedure will have to be rescheduled.                                                                                                                      Please leave ALL jewelry at home prior to coming to the hospital for your procedure.   *It is your responsibility to check with your insurance company for the benefits of coverage you have for this procedure. Unfortunately, not all insurance companies have benefits to cover all or part of these types of procedures. It is your responsibility to check your benefits, however we will be glad to assist you with any codes your insurance company may need.   Please note that most insurance companies will not cover a screening colonoscopy for people under the age of 82  For example, with some insurance companies you may have benefits for a screening colonoscopy, but if polyps are found the diagnosis will  change and then you may have a deductible that will need to be met. Please make sure you check your benefits for screening colonoscopy as well as a diagnostic colonoscopy.    CLEAR LIQUIDS: (NO RED or PURPLE) Water  Jello   Apple Juice  White Grape Juice   Kool-Aid Soft drinks  Banana popsicles Sports Drink  Black coffee (No cream or milk) Tea (No cream or milk)  Broth (fat free beef/chicken/vegetable)  Clear liquids allow you to see your fingers on the other side of the glass.  Be sure they are NOT RED or PURPLE in color, cloudy, but CLEAR.  Do Not Eat: Dairy products of any kind Cranberry juice Tomato or V8 Juice  Orange Juice   Grapefruit Juice Red Grape Juice Alcohol   Non-dairy creamer Solid foods like cereal, oatmeal, yogurt, fruits, vegetables, creamed soups, eggs, bread, etc   HELPFUL HINTS TO MAKE DRINKING EASIER: -Trying drinking through a straw. -If you become nauseated, try consuming smaller amounts or stretch out the time between glasses.  Stop for 30 minutes & slowly start back drinking.  Call our office with any questions or concerns at [phone].  Thank You,  Christ Kick,  [place]

## 2020-07-15 NOTE — Progress Notes (Signed)
Would be best with Propofol. Thanks!

## 2020-07-17 NOTE — Progress Notes (Addendum)
Called pt and made her aware that she needed ov to arrange colonoscopy.  Scheduled virtual ov for 07/18/2020 at 10:00 with Walden Field, NP. Pt voiced understanding.

## 2020-07-18 ENCOUNTER — Telehealth (INDEPENDENT_AMBULATORY_CARE_PROVIDER_SITE_OTHER): Payer: BC Managed Care – PPO | Admitting: Nurse Practitioner

## 2020-07-18 ENCOUNTER — Other Ambulatory Visit: Payer: Self-pay

## 2020-07-18 ENCOUNTER — Telehealth: Payer: Self-pay | Admitting: *Deleted

## 2020-07-18 ENCOUNTER — Encounter: Payer: Self-pay | Admitting: Nurse Practitioner

## 2020-07-18 DIAGNOSIS — Z86010 Personal history of colonic polyps: Secondary | ICD-10-CM

## 2020-07-18 NOTE — Progress Notes (Signed)
Referring Provider: Glenda Chroman, MD Primary Care Physician:  Glenda Chroman, MD Primary GI:  Dr. Gala Romney  NOTE: Service was provided via telemedicine and was requested by the patient due to COVID-19 pandemic.  Patient Location: Home  Provider Location: [place] office  Reason for Phone Visit: Schedule colonoscopy.  Persons present on the phone encounter, with roles: Patient, myself (provider), Zara Council, LPN (updated meds and allergies)  Total time (minutes) spent on medical discussion: 26 minutes  Due to COVID-19, visit was conducted using the virtual method noted. Visit was requested by patient.  I connected with  Loretta Schaefer on 07/18/20 at 10:00 AM EDT by video visit and verified that I am speaking with the correct person using two identifiers.   I discussed the limitations, risks, security and privacy concerns of performing an evaluation and management service by telephone and the availability of in person appointments. I also discussed with the patient that there may be a patient responsible charge related to this service. The patient expressed understanding and agreed to proceed.  Chief Complaint  Patient presents with  . Colonoscopy    Due for 5 yr tcs. Doing ok    HPI:   Loretta Schaefer is a 50 y.o. female who presents for virtual visit regarding: Schedule colonoscopy.  Nurse/phone visit was deferred office visit due to likely need for propofol.  Previous colonoscopy (high risk colon cancer surveillance, personal history of colon polyps) completed 07/11/2015 which found a single 5 mm polyp in descending colon, internal hemorrhoids.  Surgical pathology not available.  Presumed 5-year repeat colonoscopy, currently due.  Today she states she is doing okay overall. No family history of colon cancer. Mother had colon polyps. She did previously have tissue hematochezia with known hemorrhoids, not taking any topical therapy; none in a few weeks. Denies abdominal pain, N/V,  hematochezia, melena, fever, chills, unintentional weight loss. Denies URI or flu-like symptoms. Denies loss of sense of taste or smell. The patient has not received COVID-19 vaccination(s). They are not interested in vaccine scheduling information. Denies chest pain, dyspnea, dizziness, lightheadedness, syncope, near syncope. Denies any other upper or lower GI symptoms.  She has been having some fatigue since starting beta blocker for BP, takes it in the evenings.  Past Medical History:  Diagnosis Date  . Anxiety   . Condyloma   . Depression   . Fatty liver   . Fibrocystic changes of left breast 05/29/2016  . GERD (gastroesophageal reflux disease)   . History of pyelonephritis 2003   right  . Hyperplastic colon polyp 06/25/10   colonoscopy by Dr. Gala Romney  . Hypertension    history of,is not on any medicqtions  . Peri-menopause 02/06/2014  . Prediabetes   . RA (rheumatoid arthritis) ([place])   . Steatohepatitis 2000   without cirrhosis. Completed Hep A/B vaccinations    Past Surgical History:  Procedure Laterality Date  . ABDOMINAL HYSTERECTOMY    . BREAST BIOPSY Left 03/2016   [facility]     x 2  . COLONOSCOPY  05/2010   normal TI, hyperplastic rectal polyp, friable anal canal  . COLONOSCOPY N/A 07/11/2015   One 5 mm polyp in the descending colon, s/p resection and retrieval. Internal hemorrhoids. Hyperplastic polyp. Surveillance 2022.   . ESOPHAGOGASTRODUODENOSCOPY  05/2010   distal ERE, small hh  . ESOPHAGOGASTRODUODENOSCOPY N/A 11/13/2013   [id number] hiatal hernia; otherwise, negative EGD.Nostigmata of cirrhosis foundPatient may not have cirrhosis at  .  FOOT SURGERY    . LIVER BIOPSY  2000's   [facility]  . PANNICULECTOMY  2005  . RADIOACTIVE SEED GUIDED EXCISIONAL BREAST BIOPSY Left 10/08/2016   Procedure: RADIOACTIVE SEED GUIDED EXCISIONAL LEFT  BREAST BIOPSY;  Surgeon: Rolm Bookbinder, MD;  Location: [facility];  Service: General;   Laterality: Left;  . S/P Hysterectomy  2005  . TOE SURGERY     surgery on left big toe at joint  . WISDOM TOOTH EXTRACTION      Current Outpatient Medications  Medication Sig Dispense Refill  . ALPRAZolam (XANAX) 0.5 MG tablet Take 0.5 mg by mouth at bedtime as needed for anxiety.    Marland Kitchen buPROPion (WELLBUTRIN XL) 150 MG 24 hr tablet Take 150 mg by mouth daily.    Marland Kitchen ibuprofen (ADVIL,MOTRIN) 200 MG tablet Take 400 mg by mouth 2 (two) times daily as needed for headache.    . loratadine (CLARITIN) 10 MG tablet Take 10 mg by mouth daily.    . metFORMIN (GLUCOPHAGE) 500 MG tablet Take 500 mg by mouth daily.    . metoprolol succinate (TOPROL-XL) 25 MG 24 hr tablet Take 1 tablet by mouth daily.    . Omega-3 Fatty Acids (FISH OIL PO) Take 1,000 mg by mouth daily.    . pantoprazole (PROTONIX) 40 MG tablet TAKE 1 TABLET BY MOUTH EVERY DAY 30 MINUTES BEFORE BREAK 30 tablet 3  . Vitamin D, Ergocalciferol, (DRISDOL) 1.25 MG (50000 UNIT) CAPS capsule Take 50,000 Units by mouth once a week.    . vitamin E 400 UNIT capsule Take 400 Units by mouth daily.     No current facility-administered medications for this visit.    Allergies as of 07/18/2020 - Review Complete 07/18/2020  Allergen Reaction Noted  . Omnipaque [iohexol] Hives and Itching 06/02/2010    Family History  Problem Relation Age of Onset  . Colon polyps Mother        less than age 13  . Cirrhosis Mother        NASH, deceased  . Depression Mother   . Diabetes Mother   . Heart disease Mother   . Cirrhosis Maternal Aunt        NASH  . Bipolar disorder Sister   . Asthma Sister   . Depression Sister        bipolar  . COPD Sister   . Asthma Brother   . Diabetes Sister   . Hypertension Sister   . Other Sister        degenerative disc in back  . Supraventricular tachycardia Son   . Asthma Son   . Heart attack Father   . COPD Father   . Emphysema Father   . Dementia Father     Social History   Socioeconomic History  .  Marital status: Married    Spouse name: Not on file  . Number of children: Not on file  . Years of education: Not on file  . Highest education level: Not on file  Occupational History  . Not on file  Tobacco Use  . Smoking status: Never Smoker  . Smokeless tobacco: Never Used  Vaping Use  . Vaping Use: Never used  Substance and Sexual Activity  . Alcohol use: No  . Drug use: No  . Sexual activity: Yes    Birth control/protection: Surgical    Comment: hyst  Other Topics Concern  . Not on file  Social History Narrative  . Not on file   Social  Determinants of Health   Financial Resource Strain: Low Risk   . Difficulty of Paying Living Expenses: Not hard at all  Food Insecurity: No Food Insecurity  . Worried About Charity fundraiser in the Last Year: Never true  . Ran Out of Food in the Last Year: Never true  Transportation Needs: No Transportation Needs  . Lack of Transportation (Medical): No  . Lack of Transportation (Non-Medical): No  Physical Activity: Inactive  . Days of Exercise per Week: 0 days  . Minutes of Exercise per Session: 0 min  Stress: Stress Concern Present  . Feeling of Stress : To some extent  Social Connections: Moderately Isolated  . Frequency of Communication with Friends and Family: More than three times a week  . Frequency of Social Gatherings with Friends and Family: More than three times a week  . Attends Religious Services: Never  . Active Member of Clubs or Organizations: No  . Attends Archivist Meetings: Never  . Marital Status: Married    Review of Systems: Review of Systems  Constitutional: Negative for chills, fever, malaise/fatigue and weight loss.  HENT: Negative for congestion and sore throat.   Respiratory: Negative for cough and shortness of breath.   Cardiovascular: Negative for chest pain and palpitations.  Gastrointestinal: Negative for abdominal pain, blood in stool, diarrhea, melena, nausea and vomiting.   Musculoskeletal: Negative for joint pain and myalgias.  Skin: Negative for rash.  Neurological: Negative for dizziness and weakness.  Endo/Heme/Allergies: Does not bruise/bleed easily.  Psychiatric/Behavioral: Negative for depression. The patient is not nervous/anxious.   All other systems reviewed and are negative.   Physical Exam: Note: limited exam due to virtual visit There were no vitals taken for this visit. Physical Exam Nursing note reviewed.  Constitutional:      General: She is not in acute distress.    Appearance: Normal appearance. She is well-developed. She is not ill-appearing, toxic-appearing or diaphoretic.  HENT:     Head: Normocephalic and atraumatic.     Nose: No congestion or rhinorrhea.  Eyes:     General: No scleral icterus. Pulmonary:     Effort: Pulmonary effort is normal. No respiratory distress.  Abdominal:     General: There is no distension.     Palpations: There is no hepatomegaly or splenomegaly.  Skin:    Coloration: Skin is not jaundiced.     Findings: No rash.  Neurological:     General: No focal deficit present.     Mental Status: She is alert and oriented to person, place, and time.  Psychiatric:        Attention and Perception: Attention normal.        Mood and Affect: Mood normal.        Speech: Speech normal.        Behavior: Behavior normal.        Thought Content: Thought content normal.        Cognition and Memory: Cognition and memory normal.       Assessment: Very pleasant 50 year old female presents to schedule V year repeat colonoscopy based on high risk screening protocol.  She is generally asymptomatic from a GI standpoint.  She did have some scant tissue hematochezia that she noted at her last visit a few months ago.  She has not had any in several weeks.  She does have a history of known internal hemorrhoids.  Not currently on any topical therapy.  I advised her that if she has  any further bleeding to call our office and  we can do either Preparation H or Anusol to help.  She may be a candidate for hemorrhoid banding given known internal hemorrhoids, which always remains an option for long-term management.  We will proceed with scheduling a colonoscopy at this time and further recommendations will follow.   Proceed with colonoscopy on propofol/MAC by Dr. Gala Romney in near future: the risks, benefits, and alternatives have been discussed with the patient in detail. The patient states understanding and desires to proceed.  The patient is currently on Xanax, Wellbutrin, Glucophage. The patient is not on any other anticoagulants, anxiolytics, chronic pain medications, antidepressants, antidiabetics, or iron supplements.  We will make adjustments to her Glucophage.  We will plan for the procedure and prep.  MAC document adequate sedation.   Plan:  1. Update colonoscopy as per above 2. Further recommendations will follow 3. Follow-up based on post procedure recommendations were as needed for GI symptoms.     Thank you for allowing [place] to participate in the care of [place], DNP, AGNP-C Adult & Gerontological Nurse Practitioner [facility]

## 2020-07-18 NOTE — Telephone Encounter (Signed)
Pt consented to a virtual visit. 

## 2020-07-18 NOTE — Patient Instructions (Signed)
Your health issues we discussed today were:   Need for colonoscopy: 1. We will schedule your colonoscopy for you 2. Further recommendations to follow your colonoscopy 3. Call us if you have any questions about your bowel prep  Overall I recommend:  1. Continue other current medications 2. Return for follow-up based on recommendations after colonoscopy, or as needed for GI symptoms 3. Call for any questions or concerns   ---------------------------------------------------------------  I am glad you have gotten your COVID-19 vaccination!  Even though you are fully vaccinated you should continue to follow CDC and state/local guidelines.  ---------------------------------------------------------------   At [facility] we value your feedback. You may receive a survey about your visit today. Please share your experience as we strive to create trusting relationships with our patients to provide genuine, compassionate, quality care.  We appreciate your understanding and patience as we review any laboratory studies, imaging, and other diagnostic tests that are ordered as we care for you. Our office policy is 5 business days for review of these results, and any emergent or urgent results are addressed in a timely manner for your best interest. If you do not hear from our office in 1 week, please contact us.   We also encourage the use of MyChart, which contains your medical information for your review as well. If you are not enrolled in this feature, an access code is on this after visit summary for your convenience. Thank you for allowing [place] to be involved in your care.  It was great to see you today!  I hope you have a great spring!!

## 2020-07-18 NOTE — Telephone Encounter (Signed)
We are awaiting Dr. Roseanne Kaufman July schedule to schedule for procedure

## 2020-07-18 NOTE — Telephone Encounter (Signed)
[name], you are scheduled for a virtual visit with your provider today.  Just as we do with appointments in the office, we must obtain your consent to participate.  Your consent will be active for this visit and any virtual visit you may have with one of our providers in the next 365 days.  If you have a MyChart account, I can also send a copy of this consent to you electronically.  All virtual visits are billed to your insurance company just like a traditional visit in the office.  As this is a virtual visit, video technology does not allow for your provider to perform a traditional examination.  This may limit your provider's ability to fully assess your condition.  If your provider identifies any concerns that need to be evaluated in person or the need to arrange testing such as labs, EKG, etc, we will make arrangements to do so.  Although advances in technology are sophisticated, we cannot ensure that it will always work on either your end or our end.  If the connection with a video visit is poor, we may have to switch to a telephone visit.  With either a video or telephone visit, we are not always able to ensure that we have a secure connection.   I need to obtain your verbal consent now.   Are you willing to proceed with your visit today?

## 2020-07-24 ENCOUNTER — Telehealth: Payer: Self-pay | Admitting: Internal Medicine

## 2020-07-24 NOTE — Telephone Encounter (Signed)
Pt called asking when was she going to be scheduled for her colonoscopy. I told her it was documented in her chart that we were waiting on Dr Roseanne Kaufman schedule for July to be released. She said she thought it was going to be in June. I told her that someone will be in touch to get her scheduled once the schedule was available.

## 2020-07-25 NOTE — Telephone Encounter (Signed)
noted 

## 2020-07-31 ENCOUNTER — Other Ambulatory Visit: Payer: Self-pay | Admitting: Gastroenterology

## 2020-08-06 ENCOUNTER — Telehealth: Payer: Self-pay | Admitting: *Deleted

## 2020-08-06 NOTE — Telephone Encounter (Signed)
Pt called in and informed us that PCP started her on a new medication.  Added Rosuvastatin Calcium 10 mg to med list.  Routing to Dr. Gala Romney as Juluis Rainier.  Pt will be scheduled for procedure once July schedules become available.

## 2020-08-07 NOTE — Telephone Encounter (Addendum)
Spoke to pt.  She was made aware that she could take the statin if she needs it.  She was made aware she ought to have her liver enzymes in about a month after starting it.  She would like [place] to arrange blood work for her liver enzymes to be checked in a month.    Dr. Gala Romney:  What labs would you like me to order for pt to have drawn in a month?

## 2020-08-07 NOTE — Telephone Encounter (Signed)
Noted.  Please take the statin if she needs it.  She ought to have her liver enzymes checked in about a month after starting it.

## 2020-08-08 ENCOUNTER — Other Ambulatory Visit: Payer: Self-pay | Admitting: *Deleted

## 2020-08-08 DIAGNOSIS — K219 Gastro-esophageal reflux disease without esophagitis: Secondary | ICD-10-CM

## 2020-08-08 DIAGNOSIS — K7581 Nonalcoholic steatohepatitis (NASH): Secondary | ICD-10-CM

## 2020-08-08 DIAGNOSIS — E7800 Pure hypercholesterolemia, unspecified: Secondary | ICD-10-CM

## 2020-08-08 NOTE — Telephone Encounter (Signed)
Hepatic function profile

## 2020-08-08 NOTE — Telephone Encounter (Signed)
Noted.  Order placed and mailed to pt.  She is aware to have checked one month from now.  Pt voiced understanding.

## 2020-09-02 ENCOUNTER — Telehealth: Payer: Self-pay | Admitting: *Deleted

## 2020-09-02 ENCOUNTER — Encounter: Payer: Self-pay | Admitting: *Deleted

## 2020-09-02 ENCOUNTER — Other Ambulatory Visit: Payer: Self-pay

## 2020-09-02 ENCOUNTER — Ambulatory Visit (HOSPITAL_COMMUNITY)
Admission: RE | Admit: 2020-09-02 | Discharge: 2020-09-02 | Disposition: A | Discharge status: Home / Self Care | Payer: BC Managed Care – PPO | Source: Ambulatory Visit | Attending: Adult Health | Admitting: Adult Health

## 2020-09-02 ENCOUNTER — Other Ambulatory Visit (HOSPITAL_COMMUNITY): Payer: Self-pay | Admitting: Adult Health

## 2020-09-02 DIAGNOSIS — Z1231 Encounter for screening mammogram for malignant neoplasm of breast: Secondary | ICD-10-CM

## 2020-09-02 NOTE — Telephone Encounter (Signed)
Called pt. She is scheduled for TCS with propofol, ASA 3 with Dr. Gala Romney on 6/30 at 8:15am. Aware will send instructions via mychart and she will also need a pre-op appt that I will send via mychart. She voiced understanding.   Per [employer]:  "The following solutions for the service date entered do not require Pre-Authorization by [employer]. Please contact the health plan using the number on the back of the member's ID card to determine if a Pre-Authorization is required."

## 2020-09-11 LAB — HEPATIC FUNCTION PANEL
AG Ratio: 1.7 (calc) (ref 1.0–2.5)
ALT: 45 U/L — ABNORMAL HIGH (ref 6–29)
AST: 43 U/L — ABNORMAL HIGH (ref 10–35)
Albumin: 4.3 g/dL (ref 3.6–5.1)
Alkaline phosphatase (APISO): 55 U/L (ref 37–153)
Bilirubin, Direct: 0.1 mg/dL (ref 0.0–0.2)
Globulin: 2.5 g/dL (calc) (ref 1.9–3.7)
Indirect Bilirubin: 0.4 mg/dL (calc) (ref 0.2–1.2)
Total Bilirubin: 0.5 mg/dL (ref 0.2–1.2)
Total Protein: 6.8 g/dL (ref 6.1–8.1)

## 2020-09-13 ENCOUNTER — Telehealth: Payer: Self-pay | Admitting: Internal Medicine

## 2020-09-13 NOTE — Telephone Encounter (Signed)
PLEASE CALL PATIENT ABOUT HER LABS.  SHE SAID THAT A FEW NUMBERS WERE HIGH AND SHE WANTED TO TALK ABOUT IT.  [phone]

## 2020-09-13 NOTE — Telephone Encounter (Signed)
Spoke to pt.  She is aware that we have not seen her lab work yet.  She was informed that I will call her once provider has reviewed.  Routing to [employer], PA-C in Dr. Roseanne Kaufman absence since pt is concerned.

## 2020-09-13 NOTE — Telephone Encounter (Signed)
Reviewed HFP. Mild elevated of AST and ALT in the 40s. May be secondary to recently starting a statin. She should avoid all alcohol, limit tylenol use, avoid OTC supplements and herbal teas, and will likely need repeat HFP in 4 weeks. Will defer to Dr. Gala Romney for further recommendations/timing of HFP as this is not urgent. He will be back next week.

## 2020-09-16 NOTE — Telephone Encounter (Signed)
Spoke to pt.  Informed her of results and recommendations.  She was advised to avoid all alcohol, limit tylenol use and avoid OTC supplements and herbal teas.  Pt informed that she may need repeat labs in 4 weeks.  Aliene Altes, PA-C to discuss further with Dr. Gala Romney.  Pt said that she is going to call her PCP as well to let them know about results.

## 2020-09-17 ENCOUNTER — Other Ambulatory Visit: Payer: Self-pay | Admitting: *Deleted

## 2020-09-17 DIAGNOSIS — K7581 Nonalcoholic steatohepatitis (NASH): Secondary | ICD-10-CM

## 2020-09-17 NOTE — Telephone Encounter (Signed)
HFP order placed in Epic.  Mailing out lab requisition order to pt for her to have drawn in 4 weeks.

## 2020-09-18 NOTE — Patient Instructions (Signed)
Loretta Schaefer  09/18/2020     @PREFPERIOPPHARMACY @   Your procedure is scheduled on  09/26/2020.   Report to [facility] at  [phone] A.M.  Call this number if you have problems the morning of surgery:  [phone]   Remember:  Follow the diet and prep instructions given to you by the office.    Take these medicines the morning of surgery with A SIP OF WATER    Xanax(if needed), wellbutrin, claritin, protonix.     Please brush your teeth.  Do not wear jewelry, make-up or nail polish.  Do not wear lotions, powders, or perfumes, or deodorant.  Do not shave 48 hours prior to surgery.  Men may shave face and neck.  Do not bring valuables to the hospital.  [facility] is not responsible for any belongings or valuables.  Contacts, dentures or bridgework may not be worn into surgery.  Leave your suitcase in the car.  After surgery it may be brought to your room.  For patients admitted to the hospital, discharge time will be determined by your treatment team.  Patients discharged the day of surgery will not be allowed to drive home and must have someone with them for 24 hours.     Special instructions:    DO NOT smoke tobacco or vape for 24 hours before your procedure.  Please read over the following fact sheets that you were given. Anesthesia Post-op Instructions and Care and Recovery After Surgery      Colonoscopy, Adult, Care After This sheet gives you information about how to care for yourself after your procedure. Your health care provider may also give you more specific instructions. If you have problems or questions, contact your health careprovider. What can I expect after the procedure? After the procedure, it is common to have: A small amount of blood in your stool for 24 hours after the procedure. Some gas. Mild cramping or bloating of your abdomen. Follow these instructions at home: Eating and drinking  Drink enough fluid to keep your urine pale  yellow. Follow instructions from your health care provider about eating or drinking restrictions. Resume your normal diet as instructed by your health care provider. Avoid heavy or fried foods that are hard to digest.  Activity Rest as told by your health care provider. Avoid sitting for a long time without moving. Get up to take short walks every 1-2 hours. This is important to improve blood flow and breathing. Ask for help if you feel weak or unsteady. Return to your normal activities as told by your health care provider. Ask your health care provider what activities are safe for you. Managing cramping and bloating  Try walking around when you have cramps or feel bloated. Apply heat to your abdomen as told by your health care provider. Use the heat source that your health care provider recommends, such as a moist heat pack or a heating pad. Place a towel between your skin and the heat source. Leave the heat on for 20-30 minutes. Remove the heat if your skin turns bright red. This is especially important if you are unable to feel pain, heat, or cold. You may have a greater risk of getting burned.  General instructions If you were given a sedative during the procedure, it can affect you for several hours. Do not drive or operate machinery until your health care provider says that it is safe. For the first 24 hours after the  procedure: Do not sign important documents. Do not drink alcohol. Do your regular daily activities at a slower pace than normal. Eat soft foods that are easy to digest. Take over-the-counter and prescription medicines only as told by your health care provider. Keep all follow-up visits as told by your health care provider. This is important. Contact a health care provider if: You have blood in your stool 2-3 days after the procedure. Get help right away if you have: More than a small spotting of blood in your stool. Large blood clots in your stool. Swelling of your  abdomen. Nausea or vomiting. A fever. Increasing pain in your abdomen that is not relieved with medicine. Summary After the procedure, it is common to have a small amount of blood in your stool. You may also have mild cramping and bloating of your abdomen. If you were given a sedative during the procedure, it can affect you for several hours. Do not drive or operate machinery until your health care provider says that it is safe. Get help right away if you have a lot of blood in your stool, nausea or vomiting, a fever, or increased pain in your abdomen. This information is not intended to replace advice given to you by your health care provider. Make sure you discuss any questions you have with your healthcare provider. Document Revised: 03/10/2019 Document Reviewed: 10/10/2018 Elsevier Patient Education  [place] After This sheet gives you information about how to care for yourself after your procedure. Your health care provider may also give you more specific instructions. If you have problems or questions, contact your health careprovider. What can I expect after the procedure? After the procedure, it is common to have: Tiredness. Forgetfulness about what happened after the procedure. Impaired judgment for important decisions. Nausea or vomiting. Some difficulty with balance. Follow these instructions at home: For the time period you were told by your health care provider:     Rest as needed. Do not participate in activities where you could fall or become injured. Do not drive or use machinery. Do not drink alcohol. Do not take sleeping pills or medicines that cause drowsiness. Do not make important decisions or sign legal documents. Do not take care of children on your own. Eating and drinking Follow the diet that is recommended by your health care provider. Drink enough fluid to keep your urine pale yellow. If you vomit: Drink water,  juice, or soup when you can drink without vomiting. Make sure you have little or no nausea before eating solid foods. General instructions Have a responsible adult stay with you for the time you are told. It is important to have someone help care for you until you are awake and alert. Take over-the-counter and prescription medicines only as told by your health care provider. If you have sleep apnea, surgery and certain medicines can increase your risk for breathing problems. Follow instructions from your health care provider about wearing your sleep device: Anytime you are sleeping, including during daytime naps. While taking prescription pain medicines, sleeping medicines, or medicines that make you drowsy. Avoid smoking. Keep all follow-up visits as told by your health care provider. This is important. Contact a health care provider if: You keep feeling nauseous or you keep vomiting. You feel light-headed. You are still sleepy or having trouble with balance after 24 hours. You develop a rash. You have a fever. You have redness or swelling around the IV site. Get help right  away if: You have trouble breathing. You have new-onset confusion at home. Summary For several hours after your procedure, you may feel tired. You may also be forgetful and have poor judgment. Have a responsible adult stay with you for the time you are told. It is important to have someone help care for you until you are awake and alert. Rest as told. Do not drive or operate machinery. Do not drink alcohol or take sleeping pills. Get help right away if you have trouble breathing, or if you suddenly become confused. This information is not intended to replace advice given to you by your health care provider. Make sure you discuss any questions you have with your healthcare provider. Document Revised: 11/30/2019 Document Reviewed: 02/16/2019 Elsevier Patient Education  2022 [employer].

## 2020-09-23 ENCOUNTER — Encounter (HOSPITAL_COMMUNITY): Payer: Self-pay

## 2020-09-23 ENCOUNTER — Encounter (HOSPITAL_COMMUNITY)
Admission: RE | Admit: 2020-09-23 | Discharge: 2020-09-23 | Disposition: A | Discharge status: Home / Self Care | Payer: BC Managed Care – PPO | Source: Ambulatory Visit | Attending: Internal Medicine | Admitting: Internal Medicine

## 2020-09-23 ENCOUNTER — Other Ambulatory Visit: Payer: Self-pay

## 2020-09-23 DIAGNOSIS — Z01818 Encounter for other preprocedural examination: Secondary | ICD-10-CM | POA: Insufficient documentation

## 2020-09-23 DIAGNOSIS — D120 Benign neoplasm of cecum: Secondary | ICD-10-CM | POA: Diagnosis not present

## 2020-09-23 DIAGNOSIS — Z7984 Long term (current) use of oral hypoglycemic drugs: Secondary | ICD-10-CM | POA: Diagnosis not present

## 2020-09-23 DIAGNOSIS — Z1211 Encounter for screening for malignant neoplasm of colon: Secondary | ICD-10-CM | POA: Diagnosis not present

## 2020-09-23 DIAGNOSIS — Z86010 Personal history of colonic polyps: Secondary | ICD-10-CM | POA: Diagnosis present

## 2020-09-23 DIAGNOSIS — Z8719 Personal history of other diseases of the digestive system: Secondary | ICD-10-CM | POA: Diagnosis not present

## 2020-09-23 DIAGNOSIS — Z79899 Other long term (current) drug therapy: Secondary | ICD-10-CM | POA: Diagnosis not present

## 2020-09-23 DIAGNOSIS — Z91041 Radiographic dye allergy status: Secondary | ICD-10-CM | POA: Diagnosis not present

## 2020-09-23 LAB — BASIC METABOLIC PANEL
Anion gap: 7 (ref 5–15)
BUN: 7 mg/dL (ref 6–20)
CO2: 26 mmol/L (ref 22–32)
Calcium: 9.1 mg/dL (ref 8.9–10.3)
Chloride: 105 mmol/L (ref 98–111)
Creatinine, Ser: 0.86 mg/dL (ref 0.44–1.00)
GFR, Estimated: >60 mL/min (ref >60)
Glucose, Bld: 134 mg/dL — ABNORMAL HIGH (ref 70–99)
Potassium: 4 mmol/L (ref 3.5–5.1)
Sodium: 138 mmol/L (ref 135–145)

## 2020-09-24 ENCOUNTER — Telehealth: Payer: Self-pay | Admitting: Internal Medicine

## 2020-09-24 NOTE — Telephone Encounter (Signed)
RECALL FOR ULTRASOUND 

## 2020-09-24 NOTE — Telephone Encounter (Signed)
Recall mailed 

## 2020-09-26 ENCOUNTER — Encounter (HOSPITAL_COMMUNITY)
Admission: RE | Disposition: A | Discharge status: Home / Self Care | Payer: Self-pay | Source: Home / Self Care | Attending: Internal Medicine

## 2020-09-26 ENCOUNTER — Other Ambulatory Visit: Payer: Self-pay

## 2020-09-26 ENCOUNTER — Ambulatory Visit (HOSPITAL_COMMUNITY)
Admission: RE | Admit: 2020-09-26 | Discharge: 2020-09-26 | Disposition: A | Discharge status: Home / Self Care | Payer: BC Managed Care – PPO | Attending: Internal Medicine | Admitting: Internal Medicine

## 2020-09-26 ENCOUNTER — Encounter (HOSPITAL_COMMUNITY): Payer: Self-pay | Admitting: Internal Medicine

## 2020-09-26 ENCOUNTER — Ambulatory Visit (HOSPITAL_COMMUNITY): Payer: BC Managed Care – PPO | Admitting: Anesthesiology

## 2020-09-26 DIAGNOSIS — Z86010 Personal history of colonic polyps: Secondary | ICD-10-CM | POA: Diagnosis not present

## 2020-09-26 DIAGNOSIS — K635 Polyp of colon: Secondary | ICD-10-CM

## 2020-09-26 DIAGNOSIS — Z79899 Other long term (current) drug therapy: Secondary | ICD-10-CM | POA: Insufficient documentation

## 2020-09-26 DIAGNOSIS — Z8719 Personal history of other diseases of the digestive system: Secondary | ICD-10-CM | POA: Insufficient documentation

## 2020-09-26 DIAGNOSIS — Z7984 Long term (current) use of oral hypoglycemic drugs: Secondary | ICD-10-CM | POA: Insufficient documentation

## 2020-09-26 DIAGNOSIS — Z1211 Encounter for screening for malignant neoplasm of colon: Secondary | ICD-10-CM | POA: Insufficient documentation

## 2020-09-26 DIAGNOSIS — Z91041 Radiographic dye allergy status: Secondary | ICD-10-CM | POA: Insufficient documentation

## 2020-09-26 DIAGNOSIS — D120 Benign neoplasm of cecum: Secondary | ICD-10-CM | POA: Insufficient documentation

## 2020-09-26 HISTORY — PX: POLYPECTOMY: SHX5525

## 2020-09-26 HISTORY — PX: COLONOSCOPY WITH PROPOFOL: SHX5780

## 2020-09-26 LAB — GLUCOSE, CAPILLARY: Glucose-Capillary: 92 mg/dL (ref 70–99)

## 2020-09-26 SURGERY — COLONOSCOPY WITH PROPOFOL
Anesthesia: General

## 2020-09-26 MED ORDER — PROPOFOL 500 MG/50ML IV EMUL
INTRAVENOUS | Status: DC | PRN
  Administered 2020-09-26: 150 ug/kg/min via INTRAVENOUS

## 2020-09-26 MED ORDER — ONDANSETRON HCL 4 MG/2ML IJ SOLN
4.0000 mg | Freq: Once | INTRAMUSCULAR | Status: AC
  Administered 2020-09-26: 4 mg via INTRAVENOUS

## 2020-09-26 MED ORDER — PROPOFOL 10 MG/ML IV BOLUS
INTRAVENOUS | Status: AC
  Filled 2020-09-26: qty 60

## 2020-09-26 MED ORDER — LIDOCAINE HCL 1 % IJ SOLN
INTRAMUSCULAR | Status: DC | PRN
  Administered 2020-09-26: 50 mg via INTRADERMAL

## 2020-09-26 MED ORDER — PROPOFOL 10 MG/ML IV BOLUS
INTRAVENOUS | Status: DC | PRN
  Administered 2020-09-26: 20 mg via INTRAVENOUS
  Administered 2020-09-26: 50 mg via INTRAVENOUS
  Administered 2020-09-26: 20 mg via INTRAVENOUS

## 2020-09-26 MED ORDER — ONDANSETRON HCL 4 MG/2ML IJ SOLN
INTRAMUSCULAR | Status: AC
  Filled 2020-09-26: qty 2

## 2020-09-26 NOTE — Discharge Instructions (Addendum)
  Colonoscopy Discharge Instructions  Read the instructions outlined below and refer to this sheet in the next few weeks. These discharge instructions provide you with general information on caring for yourself after you leave the hospital. Your doctor may also give you specific instructions. While your treatment has been planned according to the most current medical practices available, unavoidable complications occasionally occur. If you have any problems or questions after discharge, call Dr. Gala Romney at [phone]. ACTIVITY You may resume your regular activity, but move at a slower pace for the next 24 hours.  Take frequent rest periods for the next 24 hours.  Walking will help get rid of the air and reduce the bloated feeling in your belly (abdomen).  No driving for 24 hours (because of the medicine (anesthesia) used during the test).   Do not sign any important legal documents or operate any machinery for 24 hours (because of the anesthesia used during the test).  NUTRITION Drink plenty of fluids.  You may resume your normal diet as instructed by your doctor.  Begin with a light meal and progress to your normal diet. Heavy or fried foods are harder to digest and may make you feel sick to your stomach (nauseated).  Avoid alcoholic beverages for 24 hours or as instructed.  MEDICATIONS You may resume your normal medications unless your doctor tells you otherwise.  WHAT YOU CAN EXPECT TODAY Some feelings of bloating in the abdomen.  Passage of more gas than usual.  Spotting of blood in your stool or on the toilet paper.  IF YOU HAD POLYPS REMOVED DURING THE COLONOSCOPY: No aspirin products for 7 days or as instructed.  No alcohol for 7 days or as instructed.  Eat a soft diet for the next 24 hours.  FINDING OUT THE RESULTS OF YOUR TEST Not all test results are available during your visit. If your test results are not back during the visit, make an appointment with your caregiver to find out the  results. Do not assume everything is normal if you have not heard from your caregiver or the medical facility. It is important for you to follow up on all of your test results.  SEEK IMMEDIATE MEDICAL ATTENTION IF: You have more than a spotting of blood in your stool.  Your belly is swollen (abdominal distention).  You are nauseated or vomiting.  You have a temperature over 101.  You have abdominal pain or discomfort that is severe or gets worse throughout the day.    1 polyp removed today  Further recommendations to follow pending review of pathology report  At patient request, I called Berneta Sages at [phone] -reviewed findings and recommendations

## 2020-09-26 NOTE — OR Nursing (Signed)
After starting IV patient became very flush and hot patient felt sick, wet cloth applied to forehead and air from bair turned on to make patient feel cooler.  Dr. Briant Cedar was notified and zofran 10m ordered and administered . Patient felt much better

## 2020-09-26 NOTE — Op Note (Signed)
[facility] Patient Name: Loretta Schaefer Procedure Date: 09/26/2020 8:04 AM MRN: [id number] Date of Birth: 07/01/1970 Attending MD: Norvel Richards , MD CSN: [id number] Age: 50 Admit Type: Outpatient Procedure:                Colonoscopy Indications:              High risk colon cancer surveillance: Personal                            history of colonic polyps Providers:                Norvel Richards, MD, Otis Peak B. Sharon Seller, RN,                            Randa Spike, Technician Referring MD:              Medicines:                Propofol per Anesthesia Complications:            No immediate complications. Estimated Blood Loss:     Estimated blood loss was minimal. Procedure:                Pre-Anesthesia Assessment:                           - Prior to the procedure, a History and Physical                            was performed, and patient medications and                            allergies were reviewed. The patient's tolerance of                            previous anesthesia was also reviewed. The risks                            and benefits of the procedure and the sedation                            options and risks were discussed with the patient.                            All questions were answered, and informed consent                            was obtained. Prior Anticoagulants: The patient has                            taken no previous anticoagulant or antiplatelet                            agents. ASA Grade Assessment: III - A patient with  severe systemic disease. After reviewing the risks                            and benefits, the patient was deemed in                            satisfactory condition to undergo the procedure.                           After obtaining informed consent, the colonoscope                            was passed under direct vision. Throughout the                            procedure,  the patient's blood pressure, pulse, and                            oxygen saturations were monitored continuously. The                            CF-HQ190L ([id number]) scope was introduced through                            the anus and advanced to the the cecum, identified                            by appendiceal orifice and ileocecal valve. The                            colonoscopy was performed without difficulty. The                            patient tolerated the procedure well. The quality                            of the bowel preparation was adequate. Scope In: 8:26:53 AM Scope Out: 8:37:26 AM Scope Withdrawal Time: 0 hours 6 minutes 29 seconds  Total Procedure Duration: 0 hours 10 minutes 33 seconds  Findings:      The perianal and digital rectal examinations were normal.      A 6 mm polyp was found in the ileocecal valve. The polyp was       semi-pedunculated. The polyp was removed with a cold snare. Resection       and retrieval were complete. Estimated blood loss was minimal. Patient       anal papilla and grade 1 hemorrhoids as well.      The exam was otherwise without abnormality on direct and retroflexion       views. Impression:               - One 6 mm polyp at the ileocecal valve, removed                            with a cold snare. Resected and retrieved.                           -  The examination was otherwise normal on direct                            and retroflexion views. Moderate Sedation:      Moderate (conscious) sedation was personally administered by an       anesthesia professional. The following parameters were monitored: oxygen       saturation, heart rate, blood pressure, respiratory rate, EKG, adequacy       of pulmonary ventilation, and response to care. Recommendation:           - Patient has a contact number available for                            emergencies. The signs and symptoms of potential                            delayed  complications were discussed with the                            patient. Return to normal activities tomorrow.                            Written discharge instructions were provided to the                            patient.                           - Advance diet as tolerated.                           - Continue present medications.                           - Repeat colonoscopy date to be determined after                            pending pathology results are reviewed for                            surveillance based on pathology results.                           - Return to GI office (date not yet determined). Procedure Code(s):        --- Professional ---                           [phone], Colonoscopy, flexible; with removal of                            tumor(s), polyp(s), or other lesion(s) by snare                            technique Diagnosis Code(s):        --- Professional ---  Z86.010, Personal history of colonic polyps                           K63.5, Polyp of colon CPT copyright 2019 American Medical Association. All rights reserved. The codes documented in this report are preliminary and upon coder review may  be revised to meet current compliance requirements. Cristopher Estimable. Kenniya Westrich, MD Norvel Richards, MD 09/26/2020 8:46:29 AM This report has been signed electronically. Number of Addenda: 0

## 2020-09-26 NOTE — Anesthesia Postprocedure Evaluation (Signed)
Anesthesia Post Note  Patient: Loretta Schaefer  Procedure(s) Performed: COLONOSCOPY WITH PROPOFOL POLYPECTOMY  Patient location during evaluation: PACU Anesthesia Type: General Level of consciousness: awake and alert Pain management: pain level controlled Vital Signs Assessment: post-procedure vital signs reviewed and stable Respiratory status: spontaneous breathing Cardiovascular status: blood pressure returned to baseline and stable Postop Assessment: no apparent nausea or vomiting Anesthetic complications: no   No notable events documented.   Last Vitals:  Vitals:   09/26/20 0714  BP: (!) 143/85  Pulse: 93  Resp: 18  Temp: 36.8 C  SpO2: 99%    Last Pain:  Vitals:   09/26/20 0820  TempSrc:   PainSc: 0-No pain                 Tydus Sanmiguel

## 2020-09-26 NOTE — H&P (Signed)
@LOGO @   Primary Care Physician:  Glenda Chroman, MD Primary Gastroenterologist:  Dr. Gala Romney  Pre-Procedure History & Physical: HPI:  Loretta Schaefer is a 50 y.o. female here for surveillance colonoscopy.  Hyperplastic polyps removed 2017.  History of colonic adenomas removed previously.  Here for surveillance colonoscopy.  Past Medical History:  Diagnosis Date   Anxiety    Condyloma    Depression    Fatty liver    Fibrocystic changes of left breast 05/29/2016   GERD (gastroesophageal reflux disease)    History of pyelonephritis 2003   right   Hyperplastic colon polyp 06/25/2010   colonoscopy by Dr. Gala Romney   Hypertension    history of,is not on any medicqtions   Peri-menopause 02/06/2014   Prediabetes    RA (rheumatoid arthritis) ([place])     Past Surgical History:  Procedure Laterality Date   ABDOMINAL HYSTERECTOMY     BREAST BIOPSY Left 03/2016   [place]     x 2   COLONOSCOPY  05/2010   normal TI, hyperplastic rectal polyp, friable anal canal   COLONOSCOPY N/A 07/11/2015   One 5 mm polyp in the descending colon, s/p resection and retrieval. Internal hemorrhoids. Hyperplastic polyp. Surveillance 2022.    ESOPHAGOGASTRODUODENOSCOPY  05/2010   distal ERE, small hh   ESOPHAGOGASTRODUODENOSCOPY N/A 11/13/2013   QBV:QXIHW hiatal hernia; otherwise, negative EGD.Nostigmata of cirrhosis foundPatient may not have cirrhosis at   [place]  2000's   [facility]   PANNICULECTOMY  2005   RADIOACTIVE SEED GUIDED EXCISIONAL BREAST BIOPSY Left 10/08/2016   Procedure: RADIOACTIVE SEED GUIDED EXCISIONAL LEFT  BREAST BIOPSY;  Surgeon: Rolm Bookbinder, MD;  Location: [facility];  Service: General;  Laterality: Left;   S/P Hysterectomy  2005   TOE SURGERY     surgery on left big toe at joint   WISDOM TOOTH EXTRACTION      Prior to Admission medications   Medication Sig Start Date End Date Taking? Authorizing Provider  ALPRAZolam  Duanne Moron) 0.5 MG tablet Take 0.5 mg by mouth daily as needed for anxiety.   Yes [provider]  buPROPion (WELLBUTRIN XL) 150 MG 24 hr tablet Take 150 mg by mouth daily with lunch.   Yes [provider]  ibuprofen (ADVIL,MOTRIN) 200 MG tablet Take 400 mg by mouth every 6 (six) hours as needed for headache or moderate pain.   Yes [provider]  loratadine (CLARITIN) 10 MG tablet Take 10 mg by mouth daily.   Yes [provider]  metFORMIN (GLUCOPHAGE) 500 MG tablet Take 500 mg by mouth every evening. 06/13/19  Yes [provider]  metoprolol succinate (TOPROL-XL) 25 MG 24 hr tablet Take 25 mg by mouth every evening. 06/28/20  Yes [provider]  Omega-3 Fatty Acids (FISH OIL) 1000 MG CAPS Take 1,000 mg by mouth daily.   Yes [provider]  pantoprazole (PROTONIX) 40 MG tablet TAKE 1 TABLET BY MOUTH EVERY DAY 30 MINUTES BEFORE BREAKFAST Patient taking differently: Take 40 mg by mouth daily before breakfast. 07/31/20  Yes Annitta Needs, NP  rosuvastatin (CRESTOR) 10 MG tablet Take 10 mg by mouth every evening.   Yes [provider]  Vitamin D, Ergocalciferol, (DRISDOL) 1.25 MG (50000 UNIT) CAPS capsule Take 50,000 Units by mouth every Saturday. 06/11/19  Yes [provider]  vitamin E 400 UNIT capsule Take 400 Units by mouth daily.   Yes  [provider]    Allergies as of 09/02/2020 - Review Complete 07/18/2020  Allergen Reaction Noted   Omnipaque [iohexol] Hives and Itching 06/02/2010    Family History  Problem Relation Age of Onset   Colon polyps Mother        less than age 51   Cirrhosis Mother        NASH, deceased   Depression Mother    Diabetes Mother    Heart disease Mother    Cirrhosis Maternal Aunt        NASH   Bipolar disorder Sister    Asthma Sister    Depression Sister        bipolar   COPD Sister    Asthma Brother    Diabetes Sister    Hypertension Sister    Other Sister         degenerative disc in back   Supraventricular tachycardia Son    Asthma Son    Heart attack Father    COPD Father    Emphysema Father    Dementia Father     Social History   Socioeconomic History   Marital status: Married    Spouse name: Not on file   Number of children: Not on file   Years of education: Not on file   Highest education level: Not on file  Occupational History   Not on file  Tobacco Use   Smoking status: Never   Smokeless tobacco: Never  Vaping Use   Vaping Use: Never used  Substance and Sexual Activity   Alcohol use: No   Drug use: No   Sexual activity: Yes    Birth control/protection: Surgical    Comment: hyst  Other Topics Concern   Not on file  Social History Narrative   Not on file   Social Determinants of Health   Financial Resource Strain: Low Risk    Difficulty of Paying Living Expenses: Not hard at all  Food Insecurity: No Food Insecurity   Worried About Charity fundraiser in the Last Year: Never true   [place] in the Last Year: Never true  Transportation Needs: No Transportation Needs   Lack of Transportation (Medical): No   Lack of Transportation (Non-Medical): No  Physical Activity: Inactive   Days of Exercise per Week: 0 days   Minutes of Exercise per Session: 0 min  Stress: Stress Concern Present   Feeling of Stress : To some extent  Social Connections: Moderately Isolated   Frequency of Communication with Friends and Family: More than three times a week   Frequency of Social Gatherings with Friends and Family: More than three times a week   Attends Religious Services: Never   Marine scientist or Organizations: No   Attends Music therapist: Never   Marital Status: Married  Human resources officer Violence: Not At Risk   Fear of Current or Ex-Partner: No   Emotionally Abused: No   Physically Abused: No   Sexually Abused: No    Review of Systems: See HPI, otherwise negative ROS  Physical Exam: BP (!)  143/85   Pulse 93   Temp 98.3 F (36.8 C) (Oral)   Resp 18   SpO2 99%  General:   Alert,  Well-developed, well-nourished, pleasant and cooperative in NAD Neck:  Supple; no masses or thyromegaly. No significant cervical adenopathy. Lungs:  Clear throughout to auscultation.   No wheezes, crackles, or rhonchi. No acute distress. Heart:  Regular rate  and rhythm; no murmurs, clicks, rubs,  or gallops. Abdomen: Non-distended, normal bowel sounds.  Soft and nontender without appreciable mass or hepatosplenomegaly.  Pulses:  Normal pulses noted. Extremities:  Without clubbing or edema.  Impression/Plan: 50 year old lady with a history of colonic adenoma here for surveillance colonoscopy per plan. The risks, benefits, limitations, alternatives and imponderables have been reviewed with the patient. Questions have been answered. All parties are agreeable.      Notice: This dictation was prepared with Dragon dictation along with smaller phrase technology. Any transcriptional errors that result from this process are unintentional and may not be corrected upon review.

## 2020-09-26 NOTE — Transfer of Care (Signed)
Immediate Anesthesia Transfer of Care Note  Patient: Loretta Schaefer  Procedure(s) Performed: COLONOSCOPY WITH PROPOFOL POLYPECTOMY  Patient Location: PACU  Anesthesia Type:General  Level of Consciousness: awake  Airway & Oxygen Therapy: Patient Spontanous Breathing  Post-op Assessment: Report given to RN  Post vital signs: Reviewed  Last Vitals:  Vitals Value Taken Time  BP    Temp    Pulse    Resp    SpO2      Last Pain:  Vitals:   09/26/20 0820  TempSrc:   PainSc: 0-No pain      Patients Stated Pain Goal: 2 (16/10/96 0454)  Complications: No notable events documented.

## 2020-09-26 NOTE — Anesthesia Preprocedure Evaluation (Signed)
Anesthesia Evaluation  Patient identified by MRN, date of birth, ID band Patient awake    Reviewed: Allergy & Precautions, H&P , NPO status , Patient's Chart, lab work & pertinent test results, reviewed documented beta blocker date and time   Airway Mallampati: II  TM Distance: >3 FB Neck ROM: full    Dental no notable dental hx.    Pulmonary neg pulmonary ROS,    Pulmonary exam normal breath sounds clear to auscultation       Cardiovascular Exercise Tolerance: Good hypertension, negative cardio ROS   Rhythm:regular Rate:Normal     Neuro/Psych PSYCHIATRIC DISORDERS Anxiety Depression negative neurological ROS  negative psych ROS   GI/Hepatic negative GI ROS, Neg liver ROS, GERD  Medicated,(+) Hepatitis -, Autoimmune  Endo/Other  negative endocrine ROS  Renal/GU negative Renal ROS  negative genitourinary   Musculoskeletal   Abdominal   Peds  Hematology negative hematology ROS (+)   Anesthesia Other Findings   Reproductive/Obstetrics negative OB ROS                             Anesthesia Physical Anesthesia Plan  ASA: 3  Anesthesia Plan: General   Post-op Pain Management:    Induction:   PONV Risk Score and Plan: Propofol infusion  Airway Management Planned:   Additional Equipment:   Intra-op Plan:   Post-operative Plan:   Informed Consent: I have reviewed the patients History and Physical, chart, labs and discussed the procedure including the risks, benefits and alternatives for the proposed anesthesia with the patient or authorized representative who has indicated his/her understanding and acceptance.     Dental Advisory Given  Plan Discussed with: CRNA  Anesthesia Plan Comments:         Anesthesia Quick Evaluation

## 2020-09-27 LAB — SURGICAL PATHOLOGY

## 2020-09-29 ENCOUNTER — Encounter: Payer: Self-pay | Admitting: Internal Medicine

## 2020-10-03 ENCOUNTER — Encounter (HOSPITAL_COMMUNITY): Payer: Self-pay | Admitting: Internal Medicine

## 2020-10-14 LAB — HEPATIC FUNCTION PANEL
AG Ratio: 1.7 (calc) (ref 1.0–2.5)
ALT: 37 U/L — ABNORMAL HIGH (ref 6–29)
AST: 30 U/L (ref 10–35)
Albumin: 4.3 g/dL (ref 3.6–5.1)
Alkaline phosphatase (APISO): 59 U/L (ref 37–153)
Bilirubin, Direct: 0.1 mg/dL (ref 0.0–0.2)
Globulin: 2.6 g/dL (calc) (ref 1.9–3.7)
Indirect Bilirubin: 0.3 mg/dL (calc) (ref 0.2–1.2)
Total Bilirubin: 0.4 mg/dL (ref 0.2–1.2)
Total Protein: 6.9 g/dL (ref 6.1–8.1)

## 2020-10-15 ENCOUNTER — Telehealth: Payer: Self-pay | Admitting: Internal Medicine

## 2020-10-15 DIAGNOSIS — K7581 Nonalcoholic steatohepatitis (NASH): Secondary | ICD-10-CM

## 2020-10-15 NOTE — Telephone Encounter (Signed)
US abdomen complete scheduled for 10/23/20 at 8:30am, arrive at 8:15am. NPO after midnight prior to test.  Called and informed pt of [place] appt. Letter mailed.

## 2020-10-15 NOTE — Telephone Encounter (Signed)
Patient received letter to schedule ultrasound

## 2020-10-15 NOTE — Addendum Note (Signed)
Addended by: Hassan Rowan on: 10/15/2020 03:48 PM   Modules accepted: Orders

## 2020-10-23 ENCOUNTER — Other Ambulatory Visit: Payer: Self-pay

## 2020-10-23 ENCOUNTER — Ambulatory Visit (HOSPITAL_COMMUNITY)
Admission: RE | Admit: 2020-10-23 | Discharge: 2020-10-23 | Disposition: A | Discharge status: Home / Self Care | Payer: BC Managed Care – PPO | Source: Ambulatory Visit | Attending: Gastroenterology | Admitting: Gastroenterology

## 2020-10-23 DIAGNOSIS — K7581 Nonalcoholic steatohepatitis (NASH): Secondary | ICD-10-CM | POA: Diagnosis not present

## 2020-10-24 ENCOUNTER — Telehealth: Payer: Self-pay | Admitting: *Deleted

## 2020-10-24 ENCOUNTER — Other Ambulatory Visit: Payer: Self-pay | Admitting: *Deleted

## 2020-10-24 DIAGNOSIS — K7581 Nonalcoholic steatohepatitis (NASH): Secondary | ICD-10-CM

## 2020-10-24 NOTE — Telephone Encounter (Signed)
Pt made aware of results.

## 2020-10-24 NOTE — Telephone Encounter (Signed)
Pt called inquiring about U/S results.  Pt was given lab results.

## 2020-10-24 NOTE — Telephone Encounter (Signed)
Please see result note. Her exam was done today. We have 5-7 business days to respond :)

## 2020-11-08 NOTE — Progress Notes (Signed)
Office Visit Note  Patient: Loretta Schaefer             Date of Birth: November 14, 1970           MRN: [id number]             PCP: Glenda Chroman, MD Referring: Glenda Chroman, MD Visit Date: 11/22/2020 Occupation: @GUAROCC @  Subjective:  Joint stiffness   History of Present Illness: Loretta Schaefer is a 50 y.o. female with a history of osteoarthritis and positive rheumatoid factor.  She states she continues to have joint stiffness in her hands.  She has the nodule on her right index finger.  She went to see Dr. Amedeo Plenty and he will remove the nodule in the future.  The surgery is not scheduled yet.  She denies his discomfort in any other joints or joint swelling.  Activities of Daily Living:  Patient reports morning stiffness for 5-10 minutes.   Patient Denies nocturnal pain.  Difficulty dressing/grooming: Denies Difficulty climbing stairs: Denies Difficulty getting out of chair: Denies Difficulty using hands for taps, buttons, cutlery, and/or writing: Reports  Review of Systems  Constitutional:  Positive for fatigue.  HENT:  Negative for mouth sores, mouth dryness and nose dryness.   Eyes:  Negative for pain, itching and dryness.  Respiratory:  Negative for shortness of breath and difficulty breathing.   Cardiovascular:  Negative for chest pain and palpitations.  Gastrointestinal:  Negative for blood in stool, constipation and diarrhea.  Endocrine: Negative for increased urination.  Genitourinary:  Negative for difficulty urinating.  Musculoskeletal:  Positive for joint pain, joint pain and morning stiffness. Negative for joint swelling, myalgias, muscle tenderness and myalgias.  Skin:  Negative for color change, rash and redness.  Allergic/Immunologic: Negative for susceptible to infections.  Neurological:  Negative for dizziness, numbness, headaches, memory loss and weakness.  Hematological:  Negative for bruising/bleeding tendency.  Psychiatric/Behavioral:  Negative for confusion.     PMFS History:  Patient Active Problem List   Diagnosis Date Noted   Boil, vulva 11/23/2019   Encounter for screening fecal occult blood testing 11/03/2019   Encounter for well woman exam with routine gynecological exam 11/03/2019   S/P hysterectomy 11/03/2019   Well woman exam with routine gynecological exam 06/11/2017   Depression 06/11/2017   Abnormal mammogram of left breast 07/03/2016   Body mass index 34.0-34.9, adult 07/03/2016   Weight loss counseling, encounter for 07/03/2016   Fibrocystic changes of left breast 05/29/2016   History of colonic polyps 07/03/2015   Peri-menopause 02/06/2014   Condyloma 07/05/2012   Anxiety 04/14/2012   GERD (gastroesophageal reflux disease) 01/14/2011   NASH (nonalcoholic steatohepatitis) 06/11/2010    Past Medical History:  Diagnosis Date   Anxiety    Condyloma    Depression    Fatty liver    Fibrocystic changes of left breast 05/29/2016   GERD (gastroesophageal reflux disease)    History of pyelonephritis 2003   right   Hyperplastic colon polyp 06/25/2010   colonoscopy by Dr. Gala Romney   Hypertension    history of,is not on any medicqtions   Peri-menopause 02/06/2014   Prediabetes    RA (rheumatoid arthritis) ([place])     Family History  Problem Relation Age of Onset   Colon polyps Mother        less than age 6   Cirrhosis Mother        NASH, deceased   Depression Mother    Diabetes Mother    Heart  disease Mother    Cirrhosis Maternal Aunt        NASH   Bipolar disorder Sister    Asthma Sister    Depression Sister        bipolar   COPD Sister    Asthma Brother    Diabetes Sister    Hypertension Sister    Other Sister        degenerative disc in back   Supraventricular tachycardia Son    Asthma Son    Heart attack Father    COPD Father    Emphysema Father    Dementia Father    Past Surgical History:  Procedure Laterality Date   ABDOMINAL HYSTERECTOMY     BREAST BIOPSY Left 03/2016   [place]     x 2   COLONOSCOPY  05/2010   normal TI, hyperplastic rectal polyp, friable anal canal   COLONOSCOPY N/A 07/11/2015   One 5 mm polyp in the descending colon, s/p resection and retrieval. Internal hemorrhoids. Hyperplastic polyp. Surveillance 2022.    COLONOSCOPY WITH PROPOFOL N/A 09/26/2020   Procedure: COLONOSCOPY WITH PROPOFOL;  Surgeon: Daneil Dolin, MD;  [facility];  Service: Endoscopy;  Laterality: N/A;  8:15am   ESOPHAGOGASTRODUODENOSCOPY  05/2010   distal ERE, small hh   ESOPHAGOGASTRODUODENOSCOPY N/A 11/13/2013   YQM:VHQIO hiatal hernia; otherwise, negative EGD.Nostigmata of cirrhosis foundPatient may not have cirrhosis at   [place]  2000's    hospital   PANNICULECTOMY  2005   POLYPECTOMY  09/26/2020   Procedure: POLYPECTOMY;  Surgeon: Daneil Dolin, MD;  [facility];  Service: Endoscopy;;   RADIOACTIVE SEED GUIDED EXCISIONAL BREAST BIOPSY Left 10/08/2016   Procedure: RADIOACTIVE SEED GUIDED EXCISIONAL LEFT  BREAST BIOPSY;  Surgeon: Rolm Bookbinder, MD;  Location: [place];  Service: General;  Laterality: Left;   S/P Hysterectomy  2005   TOE SURGERY     surgery on left big toe at joint   WISDOM TOOTH EXTRACTION     Social History   Social History Narrative   Not on file   Immunization History  Administered Date(s) Administered   Influenza-Unspecified 01/24/2015     Objective: Vital Signs: BP (!) 144/89 (BP Location: Left Arm, Patient Position: Sitting, Cuff Size: Normal)   Pulse 66   Ht 5' (1.524 m)   Wt 185 lb 12.8 oz (84.3 kg)   BMI 36.29 kg/m    Physical Exam Vitals and nursing note reviewed.  Constitutional:      Appearance: She is well-developed.  HENT:     Head: Normocephalic and atraumatic.  Eyes:     Conjunctiva/sclera: Conjunctivae normal.  Cardiovascular:     Rate and Rhythm: Normal rate and regular rhythm.     Heart sounds: Normal heart sounds.  Pulmonary:     Effort:  Pulmonary effort is normal.     Breath sounds: Normal breath sounds.  Abdominal:     General: Bowel sounds are normal.     Palpations: Abdomen is soft.  Musculoskeletal:     Cervical back: Normal range of motion.  Lymphadenopathy:     Cervical: No cervical adenopathy.  Skin:    General: Skin is warm and dry.     Capillary Refill: Capillary refill takes less than 2 seconds.     Comments: Subcutaneous nodule was noted on the right index finger on the distal phalanx  Neurological:  Mental Status: She is alert and oriented to person, place, and time.  Psychiatric:        Behavior: Behavior normal.     Musculoskeletal Exam: C-spine was in good range of motion.  Shoulder joints, elbow joints, wrist joints, MCPs PIPs and DIPs with good range of motion with no synovitis.  Mild PIP and DIP thickening was noted.  Hip joints, knee joints, ankles, MTPs and PIPs with good range of motion with no synovitis.  CDAI Exam: CDAI Score: -- Patient Global: --; Provider Global: -- Swollen: --; Tender: -- Joint Exam 11/22/2020   No joint exam has been documented for this visit   There is currently no information documented on the homunculus. Go to the Rheumatology activity and complete the homunculus joint exam.  Investigation: No additional findings.  Imaging: No results found.  Recent Labs: Lab Results  Component Value Date   WBC 8.2 10/02/2016   HGB 14.2 10/02/2016   PLT 253 10/02/2016   NA 138 09/23/2020   K 4.0 09/23/2020   CL 105 09/23/2020   CO2 26 09/23/2020   GLUCOSE 134 (H) 09/23/2020   BUN 7 09/23/2020   CREATININE 0.86 09/23/2020   BILITOT 0.4 10/14/2020   ALKPHOS 48 10/02/2016   AST 30 10/14/2020   ALT 37 (H) 10/14/2020   PROT 6.9 10/14/2020   ALBUMIN 4.2 10/02/2016   CALCIUM 9.1 09/23/2020   GFRAA >60 10/02/2016    Speciality Comments: No specialty comments available.  Procedures:  No procedures performed Allergies: Omnipaque [iohexol]   Assessment / Plan:      Visit Diagnoses: Pain in both hands - The x-ray findings were consistent with osteoarthritis.  She continues to have some stiffness in her hands.  A handout on exercises was given at the last visit.  Joint protection muscle strengthening was emphasized.  Nodule of finger of right hand - she has persistent subcutaneous nodule over her right finger distal phalanx on the palmar aspect.  Patient was evaluated by Dr. Amedeo Plenty.  She will have nodule resection in the future.  I explained to her that I am interested in pathology of the nodule.  Rheumatoid factor positive-she had no synovitis on examination.  She has been advised to contact me in case she develops any increased joint pain or joint swelling.  Other medical problems are listed as follows:  Other fatigue  Elevated cholesterol  NASH (nonalcoholic steatohepatitis)  Essential hypertension  Fibrocystic changes of left breast  Peri-menopause  History of colonic polyps  Anxiety and depression  Gastroesophageal reflux disease without esophagitis  Orders: No orders of the defined types were placed in this encounter.  No orders of the defined types were placed in this encounter.    Follow-Up Instructions: Return in about 6 months (around 05/25/2021) for Osteoarthritis.  I advised her to postpone her appointment if she does not get the biopsy results of the right hand subcutaneous nodule.   Bo Merino, MD  Note - This record has been created using Editor, commissioning.  Chart creation errors have been sought, but may not always  have been located. Such creation errors do not reflect on  the standard of medical care.

## 2020-11-22 ENCOUNTER — Other Ambulatory Visit: Payer: Self-pay

## 2020-11-22 ENCOUNTER — Encounter: Payer: Self-pay | Admitting: Rheumatology

## 2020-11-22 ENCOUNTER — Ambulatory Visit: Payer: BC Managed Care – PPO | Admitting: Rheumatology

## 2020-11-22 VITALS — BP 144/89 | HR 66 | Ht 60.0 in | Wt 185.8 lb

## 2020-11-22 DIAGNOSIS — M79641 Pain in right hand: Secondary | ICD-10-CM

## 2020-11-22 DIAGNOSIS — N6012 Diffuse cystic mastopathy of left breast: Secondary | ICD-10-CM

## 2020-11-22 DIAGNOSIS — K7581 Nonalcoholic steatohepatitis (NASH): Secondary | ICD-10-CM

## 2020-11-22 DIAGNOSIS — R2231 Localized swelling, mass and lump, right upper limb: Secondary | ICD-10-CM | POA: Diagnosis not present

## 2020-11-22 DIAGNOSIS — K219 Gastro-esophageal reflux disease without esophagitis: Secondary | ICD-10-CM

## 2020-11-22 DIAGNOSIS — R768 Other specified abnormal immunological findings in serum: Secondary | ICD-10-CM

## 2020-11-22 DIAGNOSIS — F32A Depression, unspecified: Secondary | ICD-10-CM

## 2020-11-22 DIAGNOSIS — F419 Anxiety disorder, unspecified: Secondary | ICD-10-CM

## 2020-11-22 DIAGNOSIS — R5383 Other fatigue: Secondary | ICD-10-CM | POA: Diagnosis not present

## 2020-11-22 DIAGNOSIS — M79642 Pain in left hand: Secondary | ICD-10-CM

## 2020-11-22 DIAGNOSIS — E7800 Pure hypercholesterolemia, unspecified: Secondary | ICD-10-CM

## 2020-11-22 DIAGNOSIS — I10 Essential (primary) hypertension: Secondary | ICD-10-CM

## 2020-11-22 DIAGNOSIS — N951 Menopausal and female climacteric states: Secondary | ICD-10-CM

## 2020-11-22 DIAGNOSIS — Z86010 Personal history of colonic polyps: Secondary | ICD-10-CM

## 2020-12-03 ENCOUNTER — Encounter (HOSPITAL_COMMUNITY): Payer: Self-pay

## 2020-12-03 ENCOUNTER — Other Ambulatory Visit: Payer: Self-pay

## 2020-12-03 ENCOUNTER — Emergency Department (HOSPITAL_COMMUNITY)
Admission: EM | Admit: 2020-12-03 | Discharge: 2020-12-04 | Disposition: A | Discharge status: Home / Self Care | Payer: BC Managed Care – PPO | Attending: Emergency Medicine | Admitting: Emergency Medicine

## 2020-12-03 ENCOUNTER — Emergency Department (HOSPITAL_COMMUNITY): Payer: BC Managed Care – PPO

## 2020-12-03 DIAGNOSIS — R079 Chest pain, unspecified: Secondary | ICD-10-CM

## 2020-12-03 DIAGNOSIS — Z7984 Long term (current) use of oral hypoglycemic drugs: Secondary | ICD-10-CM | POA: Insufficient documentation

## 2020-12-03 DIAGNOSIS — I10 Essential (primary) hypertension: Secondary | ICD-10-CM | POA: Insufficient documentation

## 2020-12-03 DIAGNOSIS — R7303 Prediabetes: Secondary | ICD-10-CM | POA: Diagnosis not present

## 2020-12-03 DIAGNOSIS — R0789 Other chest pain: Secondary | ICD-10-CM | POA: Diagnosis present

## 2020-12-03 DIAGNOSIS — Z79899 Other long term (current) drug therapy: Secondary | ICD-10-CM | POA: Insufficient documentation

## 2020-12-03 LAB — CBC
HCT: 43.4 % (ref 36.0–46.0)
Hemoglobin: 14.7 g/dL (ref 12.0–15.0)
MCH: 29.8 pg (ref 26.0–34.0)
MCHC: 33.9 g/dL (ref 30.0–36.0)
MCV: 88 fL (ref 80.0–100.0)
Platelets: 278 10*3/uL (ref 150–400)
RBC: 4.93 MIL/uL (ref 3.87–5.11)
RDW: 12.7 % (ref 11.5–15.5)
WBC: 8.4 10*3/uL (ref 4.0–10.5)
nRBC: 0 % (ref 0.0–0.2)

## 2020-12-03 LAB — BASIC METABOLIC PANEL
Anion gap: 7 (ref 5–15)
BUN: 7 mg/dL (ref 6–20)
CO2: 25 mmol/L (ref 22–32)
Calcium: 8.9 mg/dL (ref 8.9–10.3)
Chloride: 106 mmol/L (ref 98–111)
Creatinine, Ser: 0.9 mg/dL (ref 0.44–1.00)
GFR, Estimated: >60 mL/min (ref >60)
Glucose, Bld: 143 mg/dL — ABNORMAL HIGH (ref 70–99)
Potassium: 3.7 mmol/L (ref 3.5–5.1)
Sodium: 138 mmol/L (ref 135–145)

## 2020-12-03 LAB — TROPONIN I (HIGH SENSITIVITY): Troponin I (High Sensitivity): 2 ng/L (ref <18)

## 2020-12-03 MED ORDER — ASPIRIN 81 MG PO CHEW
324.0000 mg | CHEWABLE_TABLET | Freq: Once | ORAL | Status: AC
  Administered 2020-12-03: 324 mg via ORAL
  Filled 2020-12-03: qty 4

## 2020-12-03 MED ORDER — NITROGLYCERIN 0.4 MG SL SUBL
0.4000 mg | SUBLINGUAL_TABLET | SUBLINGUAL | Status: DC | PRN
Start: 2020-12-03 — End: 2020-12-04
  Filled 2020-12-03: qty 1

## 2020-12-03 NOTE — ED Notes (Signed)
ED Provider at bedside. 

## 2020-12-03 NOTE — ED Triage Notes (Signed)
Center CP and pressure since 1 or 2 pm today

## 2020-12-03 NOTE — ED Provider Notes (Signed)
[facility] EMERGENCY DEPARTMENT Provider Note   CSN: [id number] Arrival date & time: 12/03/20  2112     History Chief Complaint  Patient presents with   Chest Pain    Loretta Schaefer is a 50 y.o. female.  The history is provided by the patient.  Chest Pain  She has history of hypertension, prediabetes, hyperlipidemia, nonalcoholic steatohepatitis and comes in because of heavy feeling in her midsternal area since about noon today.  Symptoms have waxed and waned through the day without any particular pattern.  It did seem to be worse when she was sitting but did not seem to be affected by exertion.  There is no associated dyspnea or nausea but she has had diaphoresis.  Discomfort is rated at 5/10 currently, has been as severe as 8/10.  There has been no treatment at home.  She is a non-smoker and there is no family history of premature coronary atherosclerosis.   Past Medical History:  Diagnosis Date   Anxiety    Condyloma    Depression    Fatty liver    Fibrocystic changes of left breast 05/29/2016   GERD (gastroesophageal reflux disease)    History of pyelonephritis 2003   right   Hyperplastic colon polyp 06/25/2010   colonoscopy by Dr. Gala Romney   Hypertension    history of,is not on any medicqtions   Peri-menopause 02/06/2014   Prediabetes    RA (rheumatoid arthritis) ([place])     Patient Active Problem List   Diagnosis Date Noted   Boil, vulva 11/23/2019   Encounter for screening fecal occult blood testing 11/03/2019   Encounter for well woman exam with routine gynecological exam 11/03/2019   S/P hysterectomy 11/03/2019   Well woman exam with routine gynecological exam 06/11/2017   Depression 06/11/2017   Abnormal mammogram of left breast 07/03/2016   Body mass index 34.0-34.9, adult 07/03/2016   Weight loss counseling, encounter for 07/03/2016   Fibrocystic changes of left breast 05/29/2016   History of colonic polyps 07/03/2015   Peri-menopause 02/06/2014    Condyloma 07/05/2012   Anxiety 04/14/2012   GERD (gastroesophageal reflux disease) 01/14/2011   NASH (nonalcoholic steatohepatitis) 06/11/2010    Past Surgical History:  Procedure Laterality Date   ABDOMINAL HYSTERECTOMY     BREAST BIOPSY Left 03/2016   [place]     x 2   COLONOSCOPY  05/2010   normal TI, hyperplastic rectal polyp, friable anal canal   COLONOSCOPY N/A 07/11/2015   One 5 mm polyp in the descending colon, s/p resection and retrieval. Internal hemorrhoids. Hyperplastic polyp. Surveillance 2022.    COLONOSCOPY WITH PROPOFOL N/A 09/26/2020   Procedure: COLONOSCOPY WITH PROPOFOL;  Surgeon: Daneil Dolin, MD;  [facility];  Service: Endoscopy;  Laterality: N/A;  8:15am   ESOPHAGOGASTRODUODENOSCOPY  05/2010   distal ERE, small hh   ESOPHAGOGASTRODUODENOSCOPY N/A 11/13/2013   SLH:TDSKA hiatal hernia; otherwise, negative EGD.Nostigmata of cirrhosis foundPatient may not have cirrhosis at   [place]  2000's   [place] [facility]   PANNICULECTOMY  2005   POLYPECTOMY  09/26/2020   Procedure: POLYPECTOMY;  Surgeon: Daneil Dolin, MD;  [facility];  Service: Endoscopy;;   RADIOACTIVE SEED GUIDED EXCISIONAL BREAST BIOPSY Left 10/08/2016   Procedure: RADIOACTIVE SEED GUIDED EXCISIONAL LEFT  BREAST BIOPSY;  Surgeon: Rolm Bookbinder, MD;  Location: [place];  Service: General;  Laterality: Left;   S/P Hysterectomy  2005   TOE SURGERY     surgery on left big toe at joint   WISDOM TOOTH EXTRACTION       OB History     Gravida  2   Para  2   Term      Preterm  2   AB      Living  2      SAB      IAB      Ectopic      Multiple      Live Births  2           Family History  Problem Relation Age of Onset   Colon polyps Mother        less than age 36   Cirrhosis Mother        NASH, deceased   Depression Mother    Diabetes Mother    Heart disease Mother    Cirrhosis Maternal Aunt         NASH   Bipolar disorder Sister    Asthma Sister    Depression Sister        bipolar   COPD Sister    Asthma Brother    Diabetes Sister    Hypertension Sister    Other Sister        degenerative disc in back   Supraventricular tachycardia Son    Asthma Son    Heart attack Father    COPD Father    Emphysema Father    Dementia Father     Social History   Tobacco Use   Smoking status: Never   Smokeless tobacco: Never  Vaping Use   Vaping Use: Never used  Substance Use Topics   Alcohol use: No   Drug use: No    Home Medications Prior to Admission medications   Medication Sig Start Date End Date Taking? Authorizing Provider  buPROPion (WELLBUTRIN XL) 150 MG 24 hr tablet Take 150 mg by mouth daily with lunch.   Yes [provider]  ibuprofen (ADVIL,MOTRIN) 200 MG tablet Take 400 mg by mouth every 6 (six) hours as needed for headache or moderate pain.   Yes [provider]  loratadine (CLARITIN) 10 MG tablet Take 10 mg by mouth daily.   Yes [provider]  metFORMIN (GLUCOPHAGE) 500 MG tablet Take 500 mg by mouth every evening. 06/13/19  Yes [provider]  metoprolol succinate (TOPROL-XL) 25 MG 24 hr tablet Take 25 mg by mouth every evening. 06/28/20  Yes [provider]  Omega-3 Fatty Acids (FISH OIL) 1000 MG CAPS Take 1,000 mg by mouth daily.   Yes [provider]  pantoprazole (PROTONIX) 40 MG tablet TAKE 1 TABLET BY MOUTH EVERY DAY 30 MINUTES BEFORE BREAKFAST Patient taking differently: Take 40 mg by mouth daily before breakfast. 07/31/20  Yes Annitta Needs, NP  rosuvastatin (CRESTOR) 10 MG tablet Take 10 mg by mouth every evening.   Yes [provider]  Vitamin D, Ergocalciferol, (DRISDOL) 1.25 MG (50000 UNIT) CAPS capsule Take 50,000 Units by mouth every Saturday. 06/11/19  Yes [provider]  vitamin E 400 UNIT capsule Take 400 Units by mouth daily.   Yes [provider]  ALPRAZolam  Duanne Moron) 0.5 MG tablet Take 0.5 mg by mouth daily as needed for anxiety. Patient not taking: Reported on 11/22/2020    [provider]    Allergies    Omnipaque [iohexol]  Review of Systems   Review of Systems  Cardiovascular:  Positive for chest pain.  All other systems reviewed and are negative.  Physical Exam Updated Vital Signs BP (!) 146/89   Pulse 88   Temp 98.2 F (36.8 C) (Oral)   Resp 16   Ht 5' (1.524 m)   Wt 84.3 kg   SpO2 98%   BMI 36.29 kg/m   Physical Exam Vitals and nursing note reviewed.  50 year old female, resting comfortably and in no acute distress. Vital signs are significant for elevated blood pressure. Oxygen saturation is 98%, which is normal. Head is normocephalic and atraumatic. PERRLA, EOMI. Oropharynx is clear. Neck is nontender and supple without adenopathy or JVD. Back is nontender and there is no CVA tenderness. Lungs are clear without rales, wheezes, or rhonchi. Chest is mildly tender over the midsternal area.  This does reproduce her pain. Heart has regular rate and rhythm without murmur. Abdomen is soft, flat, nontender without masses or hepatosplenomegaly and peristalsis is normoactive. Extremities have no cyanosis or edema, full range of motion is present. Skin is warm and dry without rash. Neurologic: Mental status is normal, cranial nerves are intact, there are no motor or sensory deficits.  ED Results / Procedures / Treatments   Labs (all labs ordered are listed, but only abnormal results are displayed) Labs Reviewed  BASIC METABOLIC PANEL - Abnormal; Notable for the following components:      Result Value   Glucose, Bld 143 (*)    All other components within normal limits  CBC  TROPONIN I (HIGH SENSITIVITY)  TROPONIN I (HIGH SENSITIVITY)    EKG EKG Interpretation  Date/Time:  Tuesday December 03 2020 21:22:32 EDT Ventricular Rate:  89 PR Interval:  162 QRS Duration: 86 QT Interval:  354 QTC Calculation: 430 R  Axis:   36 Text Interpretation: Normal sinus rhythm Normal ECG When compared with ECG of 09/23/2020, No significant change was found Confirmed by Delora Fuel ([id number]) on 12/03/2020 10:51:08 PM  Radiology DG Chest 2 View  Result Date: 12/03/2020 CLINICAL DATA:  Chest pain. EXAM: CHEST - 2 VIEW COMPARISON:  None. FINDINGS: There are minimal patchy opacities in the left lower lung seen only on the frontal view. The lungs are otherwise clear. There is no pleural effusion or pneumothorax. The cardiomediastinal silhouette is within normal limits. No acute fractures are seen. IMPRESSION: Minimal left lower lung the opacity is favored to be atelectasis. Electronically Signed   By: Ronney Asters M.D.   On: 12/03/2020 22:41    Procedures Procedures   Medications Ordered in ED Medications  aspirin chewable tablet 324 mg (has no administration in time range)  nitroGLYCERIN (NITROSTAT) SL tablet 0.4 mg (has no administration in time range)    ED Course  I have reviewed the triage vital signs and the nursing notes.  Pertinent labs & imaging results that were available during my care of the patient were reviewed by me and considered in my medical decision making (see chart for details).   MDM Rules/Calculators/A&P                         Chest pain of uncertain cause.  ECG shows no acute changes, initial troponin is normal.  However, description of pain is concerning.  Initial troponin is normal.  Chest x-ray shows atelectasis.  She will be given aspirin and a therapeutic trial of nitroglycerin.  Old records are reviewed, and she has no relevant past visits.  Repeat troponin is normal.  Symptoms  have resolved.  Of note, she did not get nitroglycerin.  At this point, she is felt to be safe for discharge.  She is referred to cardiology for outpatient evaluation.  Return precautions discussed.  Final Clinical Impression(s) / ED Diagnoses Final diagnoses:  Nonspecific chest pain    Rx / DC Orders ED  Discharge Orders     None        Delora Fuel, MD 41/59/73 [phone]

## 2020-12-03 NOTE — ED Notes (Signed)
Patient transported to X-ray 

## 2020-12-04 LAB — TROPONIN I (HIGH SENSITIVITY): Troponin I (High Sensitivity): <2 ng/L (ref <18)

## 2020-12-04 NOTE — Discharge Instructions (Addendum)
Return if symptoms are getting worse.

## 2021-02-28 DIAGNOSIS — E1165 Type 2 diabetes mellitus with hyperglycemia: Secondary | ICD-10-CM | POA: Diagnosis not present

## 2021-02-28 DIAGNOSIS — Z0000 Encounter for general adult medical examination without abnormal findings: Secondary | ICD-10-CM | POA: Diagnosis not present

## 2021-02-28 DIAGNOSIS — Z1331 Encounter for screening for depression: Secondary | ICD-10-CM | POA: Diagnosis not present

## 2021-02-28 DIAGNOSIS — I10 Essential (primary) hypertension: Secondary | ICD-10-CM | POA: Diagnosis not present

## 2021-02-28 DIAGNOSIS — Z299 Encounter for prophylactic measures, unspecified: Secondary | ICD-10-CM | POA: Diagnosis not present

## 2021-02-28 DIAGNOSIS — R5383 Other fatigue: Secondary | ICD-10-CM | POA: Diagnosis not present

## 2021-02-28 DIAGNOSIS — Z789 Other specified health status: Secondary | ICD-10-CM | POA: Diagnosis not present

## 2021-02-28 DIAGNOSIS — Z6835 Body mass index (BMI) 35.0-35.9, adult: Secondary | ICD-10-CM | POA: Diagnosis not present

## 2021-02-28 DIAGNOSIS — E7800 Pure hypercholesterolemia, unspecified: Secondary | ICD-10-CM | POA: Diagnosis not present

## 2021-02-28 DIAGNOSIS — Z79899 Other long term (current) drug therapy: Secondary | ICD-10-CM | POA: Diagnosis not present

## 2021-03-07 ENCOUNTER — Telehealth: Payer: Self-pay | Admitting: Internal Medicine

## 2021-03-07 NOTE — Telephone Encounter (Signed)
Obtained labs from PCP. Labs given to Dr. Gala Romney to review.

## 2021-03-07 NOTE — Telephone Encounter (Signed)
PATIENT CALLED AND SAID THAT [facility], HER PCP, SAID HER LIVER ENZYMES WERE ELEVATED.  THEY WERE SUPPOSED TO FAX THEM HERE AND SHE WANTED TO KNOW IF WE GOT THEM .  CAN YOU REQUEST THEM AND FIND OUT WHAT DR Coralee North RECOMMENDATIONS ARE

## 2021-03-07 NOTE — Telephone Encounter (Signed)
Outside LFTs reviewed AST 59 /   ALT 62 total bilirubin 0.3 creatinine 0.80 glucose 133 white count 5.7 H&H 13.6/41.3 platelets 295 TSH 1.98  Overall, liver enzymes upticking a little bit - still only mildly increased  Continued recommendations for fatty liver:  weight loss, aerobic exercise 30 minutes 3 times a week.  Great glycemic control  If pt patient likes coffee drink 1 to 2 cups daily-will be beneficial to her liver.  Limit fast food meals  What is her current weight?  Lets repeat LFTs in 8 weeks.  Office visit visit with app in 3 months if not already scheduled.

## 2021-03-07 NOTE — Telephone Encounter (Signed)
Lmom with PCP office requesting pt's most recent lab results.

## 2021-03-10 ENCOUNTER — Encounter: Payer: Self-pay | Admitting: Internal Medicine

## 2021-03-10 NOTE — Telephone Encounter (Signed)
Labs were pushed out until Feb as requested.

## 2021-03-10 NOTE — Telephone Encounter (Signed)
Pt was made aware and verbalized understanding. Pt's current weight is 189. Previous lab orders are in for pt to have repeat HFP at end of January. Routing to front to schedule appt with app in 3 months.

## 2021-04-03 ENCOUNTER — Other Ambulatory Visit: Payer: Self-pay | Admitting: *Deleted

## 2021-04-03 ENCOUNTER — Encounter: Payer: Self-pay | Admitting: *Deleted

## 2021-04-03 DIAGNOSIS — K7581 Nonalcoholic steatohepatitis (NASH): Secondary | ICD-10-CM

## 2021-04-14 ENCOUNTER — Other Ambulatory Visit: Payer: Self-pay | Admitting: Orthopedic Surgery

## 2021-04-14 DIAGNOSIS — R2231 Localized swelling, mass and lump, right upper limb: Secondary | ICD-10-CM | POA: Diagnosis not present

## 2021-04-14 DIAGNOSIS — K7581 Nonalcoholic steatohepatitis (NASH): Secondary | ICD-10-CM | POA: Diagnosis not present

## 2021-04-14 DIAGNOSIS — D481 Neoplasm of uncertain behavior of connective and other soft tissue: Secondary | ICD-10-CM | POA: Diagnosis not present

## 2021-04-15 LAB — HEPATIC FUNCTION PANEL
AG Ratio: 1.6 (calc) (ref 1.0–2.5)
ALT: 41 U/L — ABNORMAL HIGH (ref 6–29)
AST: 36 U/L — ABNORMAL HIGH (ref 10–35)
Albumin: 4.2 g/dL (ref 3.6–5.1)
Alkaline phosphatase (APISO): 67 U/L (ref 37–153)
Bilirubin, Direct: 0.1 mg/dL (ref 0.0–0.2)
Globulin: 2.7 g/dL (calc) (ref 1.9–3.7)
Indirect Bilirubin: 0.3 mg/dL (calc) (ref 0.2–1.2)
Total Bilirubin: 0.4 mg/dL (ref 0.2–1.2)
Total Protein: 6.9 g/dL (ref 6.1–8.1)

## 2021-04-28 DIAGNOSIS — M79641 Pain in right hand: Secondary | ICD-10-CM | POA: Diagnosis not present

## 2021-05-05 DIAGNOSIS — I10 Essential (primary) hypertension: Secondary | ICD-10-CM | POA: Diagnosis not present

## 2021-05-05 DIAGNOSIS — F332 Major depressive disorder, recurrent severe without psychotic features: Secondary | ICD-10-CM | POA: Diagnosis not present

## 2021-05-05 DIAGNOSIS — Z789 Other specified health status: Secondary | ICD-10-CM | POA: Diagnosis not present

## 2021-05-05 DIAGNOSIS — E1165 Type 2 diabetes mellitus with hyperglycemia: Secondary | ICD-10-CM | POA: Diagnosis not present

## 2021-05-05 DIAGNOSIS — Z299 Encounter for prophylactic measures, unspecified: Secondary | ICD-10-CM | POA: Diagnosis not present

## 2021-05-05 DIAGNOSIS — Z6835 Body mass index (BMI) 35.0-35.9, adult: Secondary | ICD-10-CM | POA: Diagnosis not present

## 2021-05-05 DIAGNOSIS — E7800 Pure hypercholesterolemia, unspecified: Secondary | ICD-10-CM | POA: Diagnosis not present

## 2021-05-13 DIAGNOSIS — R2231 Localized swelling, mass and lump, right upper limb: Secondary | ICD-10-CM | POA: Diagnosis not present

## 2021-05-13 DIAGNOSIS — M79641 Pain in right hand: Secondary | ICD-10-CM | POA: Diagnosis not present

## 2021-05-16 NOTE — Progress Notes (Deleted)
? ?Office Visit Note ? ?Patient: Loretta Schaefer             ?Date of Birth: 1970/08/16           ?MRN: [id number]             ?PCP: Glenda Chroman, MD ?Referring: Glenda Chroman, MD ?Visit Date: 05/30/2021 ?Occupation: @GUAROCC @ ? ?Subjective:  ?No chief complaint on file. ? ? ?History of Present Illness: Loretta Schaefer is a 51 y.o. female ***  ? ?Activities of Daily Living:  ?Patient reports morning stiffness for *** {minute/hour:19697}.   ?Patient {ACTIONS;DENIES/REPORTS:21021675::"Denies"} nocturnal pain.  ?Difficulty dressing/grooming: {ACTIONS;DENIES/REPORTS:21021675::"Denies"} ?Difficulty climbing stairs: {ACTIONS;DENIES/REPORTS:21021675::"Denies"} ?Difficulty getting out of chair: {ACTIONS;DENIES/REPORTS:21021675::"Denies"} ?Difficulty using hands for taps, buttons, cutlery, and/or writing: {ACTIONS;DENIES/REPORTS:21021675::"Denies"} ? ?No Rheumatology ROS completed.  ? ?PMFS History:  ?Patient Active Problem List  ? Diagnosis Date Noted  ? Boil, vulva 11/23/2019  ? Encounter for screening fecal occult blood testing 11/03/2019  ? Encounter for well woman exam with routine gynecological exam 11/03/2019  ? S/P hysterectomy 11/03/2019  ? Well woman exam with routine gynecological exam 06/11/2017  ? Depression 06/11/2017  ? Abnormal mammogram of left breast 07/03/2016  ? Body mass index 34.0-34.9, adult 07/03/2016  ? Weight loss counseling, encounter for 07/03/2016  ? Fibrocystic changes of left breast 05/29/2016  ? History of colonic polyps 07/03/2015  ? Peri-menopause 02/06/2014  ? Condyloma 07/05/2012  ? Anxiety 04/14/2012  ? GERD (gastroesophageal reflux disease) 01/14/2011  ? NASH (nonalcoholic steatohepatitis) 06/11/2010  ?  ?Past Medical History:  ?Diagnosis Date  ? Anxiety   ? Condyloma   ? Depression   ? Fatty liver   ? Fibrocystic changes of left breast 05/29/2016  ? GERD (gastroesophageal reflux disease)   ? History of pyelonephritis 2003  ? right  ? Hyperplastic colon polyp 06/25/2010  ? colonoscopy by  Dr. Gala Romney  ? Hypertension   ? history of,is not on any medicqtions  ? Peri-menopause 02/06/2014  ? Prediabetes   ? RA (rheumatoid arthritis) ([place])   ?  ?Family History  ?Problem Relation Age of Onset  ? Colon polyps Mother   ?     less than age 26  ? Cirrhosis Mother   ?     NASH, deceased  ? Depression Mother   ? Diabetes Mother   ? Heart disease Mother   ? Cirrhosis Maternal Aunt   ?     NASH  ? Bipolar disorder Sister   ? Asthma Sister   ? Depression Sister   ?     bipolar  ? COPD Sister   ? Asthma Brother   ? Diabetes Sister   ? Hypertension Sister   ? Other Sister   ?     degenerative disc in back  ? Supraventricular tachycardia Son   ? Asthma Son   ? Heart attack Father   ? COPD Father   ? Emphysema Father   ? Dementia Father   ? ?Past Surgical History:  ?Procedure Laterality Date  ? ABDOMINAL HYSTERECTOMY    ? BREAST BIOPSY Left 03/2016  ? [place]    ? x 2  ? COLONOSCOPY  05/2010  ? normal TI, hyperplastic rectal polyp, friable anal canal  ? COLONOSCOPY N/A 07/11/2015  ? One 5 mm polyp in the descending colon, s/p resection and retrieval. Internal hemorrhoids. Hyperplastic polyp. Surveillance 2022.   ? COLONOSCOPY WITH PROPOFOL N/A 09/26/2020  ? Procedure: COLONOSCOPY WITH PROPOFOL;  Surgeon: Daneil Dolin, MD;  [facility];  Service: Endoscopy;  Laterality: N/A;  8:15am  ? ESOPHAGOGASTRODUODENOSCOPY  05/2010  ? distal ERE, small hh  ? ESOPHAGOGASTRODUODENOSCOPY N/A 11/13/2013  ? DGL:OVFIE hiatal hernia; otherwise, negative EGD.Nostigmata of cirrhosis foundPatient may not have cirrhosis at  ? FOOT SURGERY    ? LIVER BIOPSY  2000's  ? [place] [facility]  ? PANNICULECTOMY  2005  ? POLYPECTOMY  09/26/2020  ? Procedure: POLYPECTOMY;  Surgeon: Daneil Dolin, MD;  [facility];  Service: Endoscopy;;  ? RADIOACTIVE SEED GUIDED EXCISIONAL BREAST BIOPSY Left 10/08/2016  ? Procedure: RADIOACTIVE SEED GUIDED EXCISIONAL LEFT  BREAST BIOPSY;  Surgeon: Rolm Bookbinder, MD;  Location: [facility];  Service: General;  Laterality: Left;  ? S/P Hysterectomy  2005  ? TOE SURGERY    ? surgery on left big toe at joint  ? WISDOM TOOTH EXTRACTION    ? ?Social History  ? ?Social History Narrative  ? Not on file  ? ?Immunization History  ?Administered Date(s) Administered  ? Influenza-Unspecified 01/24/2015  ?  ? ?Objective: ?Vital Signs: There were no vitals taken for this visit.  ? ?Physical Exam  ? ?Musculoskeletal Exam: *** ? ?CDAI Exam: ?CDAI Score: -- ?Patient Global: --; Provider Global: -- ?Swollen: --; Tender: -- ?Joint Exam 05/30/2021  ? ?No joint exam has been documented for this visit  ? ?There is currently no information documented on the homunculus. Go to the Rheumatology activity and complete the homunculus joint exam. ? ?Investigation: ?No additional findings. ? ?Imaging: ?No results found. ? ?Recent Labs: ?Lab Results  ?Component Value Date  ? WBC 8.4 12/03/2020  ? HGB 14.7 12/03/2020  ? PLT 278 12/03/2020  ? NA 138 12/03/2020  ? K 3.7 12/03/2020  ? CL 106 12/03/2020  ? CO2 25 12/03/2020  ? GLUCOSE 143 (H) 12/03/2020  ? BUN 7 12/03/2020  ? CREATININE 0.90 12/03/2020  ? BILITOT 0.4 04/14/2021  ? ALKPHOS 48 10/02/2016  ? AST 36 (H) 04/14/2021  ? ALT 41 (H) 04/14/2021  ? PROT 6.9 04/14/2021  ? ALBUMIN 4.2 10/02/2016  ? CALCIUM 8.9 12/03/2020  ? GFRAA >60 10/02/2016  ? ? ?Speciality Comments: No specialty comments available. ? ?Procedures:  ?No procedures performed ?Allergies: Omnipaque [iohexol]  ? ?Assessment / Plan:     ?Visit Diagnoses: No diagnosis found. ? ?Orders: ?No orders of the defined types were placed in this encounter. ? ?No orders of the defined types were placed in this encounter. ? ? ?Face-to-face time spent with patient was *** minutes. Greater than 50% of time was spent in counseling and coordination of care. ? ?Follow-Up Instructions: No follow-ups on file. ? ? ?Earnestine Mealing, CMA ? ?Note - This record has been created using [employer].  ?Chart  creation errors have been sought, but may not always  ?have been located. Such creation errors do not reflect on  ?the standard of medical care.  ?

## 2021-05-26 ENCOUNTER — Telehealth: Payer: Self-pay | Admitting: Rheumatology

## 2021-05-26 NOTE — Telephone Encounter (Signed)
Patient called the office stating she has an upcoming appointment with Dr. Estanislado Pandy regarding what they thought was a RA nodule. Patient states she had it removed and it was just a cyst. Patient states she is unsure if she still needs her appointment on 3/3.

## 2021-05-26 NOTE — Telephone Encounter (Signed)
Spoke with patient and advised patient to have pathology report faxed to our office from the surgery. Patient verbalized understanding.  Patient states her symptoms have resolved since having the cyst removed and she will follow up with our office PRN. Appointment was canceled.

## 2021-05-27 ENCOUNTER — Telehealth: Payer: Self-pay | Admitting: Rheumatology

## 2021-05-27 NOTE — Telephone Encounter (Signed)
I received nodule biopsy result from Dr. Vanetta Shawl office.  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  I called patient to discuss results.  She stated that she was aware of the results and she was told by Dr. Amedeo Plenty that the tumor was benign.  She is going through physical therapy and is recovering well. Bo Merino, MD

## 2021-05-30 ENCOUNTER — Ambulatory Visit: Payer: BC Managed Care – PPO | Admitting: Rheumatology

## 2021-05-30 DIAGNOSIS — K7581 Nonalcoholic steatohepatitis (NASH): Secondary | ICD-10-CM

## 2021-05-30 DIAGNOSIS — M79641 Pain in right hand: Secondary | ICD-10-CM

## 2021-05-30 DIAGNOSIS — R5383 Other fatigue: Secondary | ICD-10-CM

## 2021-05-30 DIAGNOSIS — F32A Depression, unspecified: Secondary | ICD-10-CM

## 2021-05-30 DIAGNOSIS — I10 Essential (primary) hypertension: Secondary | ICD-10-CM

## 2021-05-30 DIAGNOSIS — N6012 Diffuse cystic mastopathy of left breast: Secondary | ICD-10-CM

## 2021-05-30 DIAGNOSIS — K219 Gastro-esophageal reflux disease without esophagitis: Secondary | ICD-10-CM

## 2021-05-30 DIAGNOSIS — E7800 Pure hypercholesterolemia, unspecified: Secondary | ICD-10-CM

## 2021-05-30 DIAGNOSIS — N951 Menopausal and female climacteric states: Secondary | ICD-10-CM

## 2021-05-30 DIAGNOSIS — Z86010 Personal history of colonic polyps: Secondary | ICD-10-CM

## 2021-05-30 DIAGNOSIS — R2231 Localized swelling, mass and lump, right upper limb: Secondary | ICD-10-CM

## 2021-05-30 DIAGNOSIS — R768 Other specified abnormal immunological findings in serum: Secondary | ICD-10-CM

## 2021-07-05 NOTE — Progress Notes (Signed)
? ? ?Referring Provider: Glenda Chroman, MD ?Primary Care Physician:  Glenda Chroman, MD ?Primary GI Physician: Dr. Gala Romney ? ?Chief Complaint  ?Patient presents with  ? Follow-up  ? ? ?HPI:   ?Loretta Schaefer is a 51 y.o. female with history of GERD, NASH undergoing yearly ultrasound and 6 month monitoring of LFTs, and history of colon polyps, presenting today for follow-up. ? ?Colonoscopy up to date June 2022 with a 6 mm polyp in the ileocecal valve removed.  Pathology with tubular adenoma.  Recommended 5-year repeat. ? ? ?Today: ? ?GERD: ?Well controlled on Protonix 40 mg daily.  She is wondering if she still needs to continue this dose.  Denies nausea, vomiting, dysphagia, abdominal pain. ? ?NASH: ?Most recent ultrasound July 2022 with hepatic steatosis.  Most recent LFTs in January 2023 with AST 36, ALT 41.  Platelets within normal limits in December 2022.  She has gained 10 lbs over the last year.  Has been under a lot of stress over the last 3 years.  Wants to lose weight, but has trouble getting motivated.  Denies abdominal distention, peripheral edema, confusion/mental status changes, yellowing of the eyes or skin, bruising. ? ?Denies alcohol use, illicit drug use, Tylenol use, OTC supplements, or herbal teas. ? ?No other concerns such as constipation, diarrhea, BRBPR, or melena. ? ? ?Past Medical History:  ?Diagnosis Date  ? Anxiety   ? Condyloma   ? Depression   ? Fibrocystic changes of left breast 05/29/2016  ? GERD (gastroesophageal reflux disease)   ? History of pyelonephritis 2003  ? right  ? Hyperplastic colon polyp 06/25/2010  ? colonoscopy by Dr. Gala Romney  ? Hypertension   ? history of,is not on any medicqtions  ? NASH (nonalcoholic steatohepatitis)   ? Peri-menopause 02/06/2014  ? Prediabetes   ? RA (rheumatoid arthritis) ([place])   ? ? ?Past Surgical History:  ?Procedure Laterality Date  ? ABDOMINAL HYSTERECTOMY    ? BREAST BIOPSY Left 03/2016  ? [place]    ? x 2  ?  COLONOSCOPY  05/2010  ? normal TI, hyperplastic rectal polyp, friable anal canal  ? COLONOSCOPY N/A 07/11/2015  ? One 5 mm polyp in the descending colon, s/p resection and retrieval. Internal hemorrhoids. Hyperplastic polyp. Surveillance 2022.   ? COLONOSCOPY WITH PROPOFOL N/A 09/26/2020  ? Surgeon: Daneil Dolin, MD;   6 mm polyp in the ileocecal valve removed.  Pathology with tubular adenoma.  Recommended 5-year repeat.  ? ESOPHAGOGASTRODUODENOSCOPY  05/2010  ? distal ERE, small hh  ? ESOPHAGOGASTRODUODENOSCOPY N/A 11/13/2013  ? SFK:CLEXN hiatal hernia; otherwise, negative EGD.Nostigmata of cirrhosis foundPatient may not have cirrhosis at  ? FOOT SURGERY    ? LIVER BIOPSY  2000's  ? [place] [facility]  ? PANNICULECTOMY  2005  ? POLYPECTOMY  09/26/2020  ? Procedure: POLYPECTOMY;  Surgeon: Daneil Dolin, MD;  [facility];  Service: Endoscopy;;  ? RADIOACTIVE SEED GUIDED EXCISIONAL BREAST BIOPSY Left 10/08/2016  ? Procedure: RADIOACTIVE SEED GUIDED EXCISIONAL LEFT  BREAST BIOPSY;  Surgeon: Rolm Bookbinder, MD;  Location: [place];  Service: General;  Laterality: Left;  ? S/P Hysterectomy  2005  ? TOE SURGERY    ? surgery on left big toe at joint  ? WISDOM TOOTH EXTRACTION    ? ? ?Current Outpatient Medications  ?Medication Sig Dispense Refill  ? buPROPion (WELLBUTRIN XL) 150 MG 24 hr tablet Take 150 mg by mouth daily with lunch.    ?  ibuprofen (ADVIL,MOTRIN) 200 MG tablet Take 400 mg by mouth every 6 (six) hours as needed for headache or moderate pain.    ? loratadine (CLARITIN) 10 MG tablet Take 10 mg by mouth daily.    ? metFORMIN (GLUCOPHAGE) 500 MG tablet Take 500 mg by mouth every evening.    ? metoprolol succinate (TOPROL-XL) 25 MG 24 hr tablet Take 25 mg by mouth every evening.    ? Omega-3 Fatty Acids (FISH OIL) 1000 MG CAPS Take 1,000 mg by mouth daily.    ? pantoprazole (PROTONIX) 20 MG tablet Take 1 tablet (20 mg total) by mouth daily before breakfast. 30 tablet 3  ? rosuvastatin (CRESTOR)  10 MG tablet Take 10 mg by mouth every evening.    ? Vitamin D, Ergocalciferol, (DRISDOL) 1.25 MG (50000 UNIT) CAPS capsule Take 50,000 Units by mouth every Saturday.    ? vitamin E 400 UNIT capsule Take 400 Units by mouth daily.    ? ?No current facility-administered medications for this visit.  ? ? ?Allergies as of 07/07/2021 - Review Complete 07/07/2021  ?Allergen Reaction Noted  ? Omnipaque [iohexol] Hives and Itching 06/02/2010  ? ? ?Family History  ?Problem Relation Age of Onset  ? Colon polyps Mother   ?     less than age 75  ? Cirrhosis Mother   ?     NASH, deceased  ? Depression Mother   ? Diabetes Mother   ? Heart disease Mother   ? Cirrhosis Maternal Aunt   ?     NASH  ? Bipolar disorder Sister   ? Asthma Sister   ? Depression Sister   ?     bipolar  ? COPD Sister   ? Asthma Brother   ? Diabetes Sister   ? Hypertension Sister   ? Other Sister   ?     degenerative disc in back  ? Supraventricular tachycardia Son   ? Asthma Son   ? Heart attack Father   ? COPD Father   ? Emphysema Father   ? Dementia Father   ? ? ?Social History  ? ?Socioeconomic History  ? Marital status: Married  ?  Spouse name: Not on file  ? Number of children: Not on file  ? Years of education: Not on file  ? Highest education level: Not on file  ?Occupational History  ? Not on file  ?Tobacco Use  ? Smoking status: Never  ? Smokeless tobacco: Never  ?Vaping Use  ? Vaping Use: Never used  ?Substance and Sexual Activity  ? Alcohol use: No  ? Drug use: No  ? Sexual activity: Yes  ?  Birth control/protection: Surgical  ?  Comment: hyst  ?Other Topics Concern  ? Not on file  ?Social History Narrative  ? Not on file  ? ?Social Determinants of Health  ? ?Financial Resource Strain: Not on file  ?Food Insecurity: Not on file  ?Transportation Needs: Not on file  ?Physical Activity: Not on file  ?Stress: Not on file  ?Social Connections: Not on file  ? ? ?Review of Systems: ?Gen: Denies fever, chills, cold or flulike symptoms, presyncope,  syncope. ?CV: Denies chest pain, palpitations.Marland Kitchen ?Resp: Denies dyspnea or cough.Marland Kitchen ?GI: See HPI. ?Heme: See HPI. ? ?Physical Exam: ?BP (!) 144/80   Pulse 70   Temp (!) 97.5 ?F (36.4 ?C) (Temporal)   Ht 5' (1.524 m)   Wt 191 lb 6.4 oz (86.8 kg)   BMI 37.38 kg/m?  ?General:  Alert and oriented. No distress noted. Pleasant and cooperative.  ?Head:  Normocephalic and atraumatic. ?Eyes:  Conjuctiva clear without scleral icterus. ?Heart:  S1, S2 present without murmurs appreciated. ?Lungs:  Clear to auscultation bilaterally. No wheezes, rales, or rhonchi. No distress.  ?Abdomen:  +BS, soft, and non-distended.  Minimal TTP in epigastric area.  No rebound or guarding. No HSM or masses noted. ?Msk:  Symmetrical without gross deformities. Normal posture. ?Extremities:  Without edema. ?Neurologic:  Alert and  oriented x4 ?Psych:  Normal mood and affect. ? ? ? ?Assessment:  ?51 year old female with history of GERD, [name], colon polyps (surveillance due in 2027), presenting today for routine follow-up. ? ?GERD:  ?Well-controlled on Protonix 40 mg daily.  She is interested in decreasing the dose that she has been on this for quite some time.  We will try decreasing to Protonix 20 mg daily.  Advised to monitor for recurrent symptoms and let us know. ? ?NASH:  ?Undergoing yearly ultrasound and 37-monthmonitoring of LFTs. Most recent ultrasound July 2022 with hepatic steatosis.  Most recent LFTs in January 2023 with AST 36, ALT 41.  Platelets within normal limits in December 2022.  Unfortunately, she has gained 10 pounds over the last year.  States she is under a lot of stress and she has a stress eater.  Desires to lose weight, but has trouble getting motivated.  No signs or symptoms of decompensated liver disease.  Recommended referral to [facility], and she is interested.  She is due for RUQ ultrasound and HFP in July.  ? ? ?Plan:  ?Decrease Protonix to 20 mg daily 30 minutes before breakfast.  She is to  monitor for recurrent reflux symptoms and let uKoreaknow if this occurs. ?Counseled on NASH including potential clinical course to progress to cirrhosis.  Discussed the importance of weight loss through diet and exercise.  Sep

## 2021-07-07 ENCOUNTER — Ambulatory Visit: Payer: BC Managed Care – PPO | Admitting: Gastroenterology

## 2021-07-07 ENCOUNTER — Other Ambulatory Visit: Payer: Self-pay

## 2021-07-07 ENCOUNTER — Encounter: Payer: Self-pay | Admitting: Gastroenterology

## 2021-07-07 VITALS — BP 144/80 | HR 70 | Temp 97.5°F | Ht 60.0 in | Wt 191.4 lb

## 2021-07-07 DIAGNOSIS — K219 Gastro-esophageal reflux disease without esophagitis: Secondary | ICD-10-CM

## 2021-07-07 DIAGNOSIS — K7581 Nonalcoholic steatohepatitis (NASH): Secondary | ICD-10-CM

## 2021-07-07 MED ORDER — PANTOPRAZOLE SODIUM 20 MG PO TBEC: 20.0000 mg | DELAYED_RELEASE_TABLET | Freq: Every day | ORAL | 3 refills | Status: DC

## 2021-07-07 NOTE — Patient Instructions (Addendum)
Try decreasing Protonix to 20 mg daily 30 minutes before breakfast.  If you have recurrent reflux symptoms 2 to 3 days a week, please let me know, and we will increase back to 40 mg daily. ? ?Instructions for fatty liver/elevated liver enzymes: ?Recommend 1-2# weight loss per week until ideal body weight through exercise & diet. ?Low fat/cholesterol diet.   ?Avoid sweets, sodas, fruit juices, sweetened beverages like tea, etc. ?Gradually increase exercise from 15 min daily up to 1 hr per day 5 days/week. ?Continue to alcohol use, OTC supplements, herbal teas. ? ?We are referring you to [facility] to help with your weight loss. ? ?You will be due for routine blood work and an ultrasound of your liver in July.  We will arrange this for you closer to time. ? ?We will follow-up with you in 6 months.  Do not hesitate to call if you have any questions or concerns prior to your next visit. ? ?It was very nice meeting you today!  ? ?Aliene Altes, PA-C ?[facility] ? ? ?

## 2021-07-17 ENCOUNTER — Telehealth (INDEPENDENT_AMBULATORY_CARE_PROVIDER_SITE_OTHER): Payer: Self-pay | Admitting: *Deleted

## 2021-07-17 DIAGNOSIS — K7581 Nonalcoholic steatohepatitis (NASH): Secondary | ICD-10-CM

## 2021-07-17 NOTE — Telephone Encounter (Signed)
May we send referral to nutritionist per pt request since she has been unable to reach [facility]? ?

## 2021-07-17 NOTE — Telephone Encounter (Signed)
Would be ok to send referral to nutritionist.  ? ?I am surprised she has not heard from [facility].  We did send a referral to them correct? ?

## 2021-07-17 NOTE — Telephone Encounter (Signed)
Patient called in to check on her refill to [facility] ? ?She has left them a message but no call back. ? ?She said Cyril Mourning had also mentioned a nutritionist. ? ?Can we send a referral to them too? ? ?I told her I would send message ?

## 2021-07-17 NOTE — Telephone Encounter (Signed)
Referral sent to nutritionist via Epic. ? ?Referral was sent to [facility] 07/07/21. ?

## 2021-07-17 NOTE — Telephone Encounter (Signed)
Called pt, informed her referral was sent to nutritionist. ?

## 2021-07-21 ENCOUNTER — Encounter: Payer: BC Managed Care – PPO | Attending: Gastroenterology | Admitting: Nutrition

## 2021-07-21 VITALS — Ht 60.0 in | Wt 191.0 lb

## 2021-07-21 DIAGNOSIS — Z6834 Body mass index (BMI) 34.0-34.9, adult: Secondary | ICD-10-CM

## 2021-07-21 DIAGNOSIS — K7581 Nonalcoholic steatohepatitis (NASH): Secondary | ICD-10-CM

## 2021-07-21 DIAGNOSIS — E66812 Obesity, class 2: Secondary | ICD-10-CM

## 2021-07-21 DIAGNOSIS — I10 Essential (primary) hypertension: Secondary | ICD-10-CM

## 2021-07-21 DIAGNOSIS — E782 Mixed hyperlipidemia: Secondary | ICD-10-CM

## 2021-07-21 NOTE — Patient Instructions (Addendum)
Goals ? ?Drink 6 bottles of water per day ?Eat meals on meals on schedule ?Avoiding snacks  ?Keep a food journal-[email or web address] ?Goal is 150 minutes of exercise per week. ?Increase fresh fruits and vegetables. ?Go to lifestylemedicine.org website. ?Look up whole food plant based recipes. ?Lose 1/2-1 lb per week. ? ?

## 2021-07-21 NOTE — Progress Notes (Signed)
Medical Nutrition Therapy  ?Appointment Start time:  0915  Appointment End time:  1015 ? ?Primary concerns today: Fatty Liver, Pre DM, HTN, Obesity ?Referral diagnosis: K76.0, E66.9, R73.03 ?Preferred learning style: No preference  ?Readiness for change : Ready ? ? ?NUTRITION ASSESSMENT  ?33 y old w female her with fatty liver, Pre DM, Obesity, Hyperlipidemia and HTN and wants to lose weight. ? ? ?Anthropometrics  ? ?Wt Readings from Last 3 Encounters:  ?07/07/21 191 lb 6.4 oz (86.8 kg)  ?12/03/20 185 lb 12.8 oz (84.3 kg)  ?11/22/20 185 lb 12.8 oz (84.3 kg)  ? ?Ht Readings from Last 3 Encounters:  ?07/07/21 5' (1.524 m)  ?12/03/20 5' (1.524 m)  ?11/22/20 5' (1.524 m)  ? ?There is no height or weight on file to calculate BMI. ?@BMIFA @ ?Facility age limit for growth percentiles is 20 years. ?Facility age limit for growth percentiles is 20 years. ? ?Clinical ?Medical Hx: see chart. HTN, Obesity, Fatty Liver, PreDM, Hyperlipidemia ?Medications: Metformin 500 mg once a day, Crestor ?Labs: No results found for: HGBA1C ? ?  Latest Ref Rng & Units 04/14/2021  ?  9:09 AM 12/03/2020  ?  9:32 PM 10/14/2020  ?  3:02 PM  ?CMP  ?Glucose 70 - 99 mg/dL  143     ?BUN 6 - 20 mg/dL  7     ?Creatinine 0.44 - 1.00 mg/dL  0.90     ?Sodium 135 - 145 mmol/L  138     ?Potassium 3.5 - 5.1 mmol/L  3.7     ?Chloride 98 - 111 mmol/L  106     ?CO2 22 - 32 mmol/L  25     ?Calcium 8.9 - 10.3 mg/dL  8.9     ?Total Protein 6.1 - 8.1 g/dL 6.9    6.9    ?Total Bilirubin 0.2 - 1.2 mg/dL 0.4    0.4    ?AST 10 - 35 U/L 36    30    ?ALT 6 - 29 U/L 41    37    ? ?Lipid Panel  ?   ?Component Value Date/Time  ? CHOL 239 (H) 05/29/2016 1105  ? TRIG 178 (H) 05/29/2016 1105  ? HDL 48 05/29/2016 1105  ? CHOLHDL 5.0 (H) 05/29/2016 1105  ? LDLCALC 155 (H) 05/29/2016 1105  ? LABVLDL 36 05/29/2016 1105  ? ? ?Notable Signs/Symptoms: none ? ?Lifestyle & Dietary Hx ?Lost her mom and father. Admits  ?Married with her husband. Takes care of grandkids. ?Eats 2 meals per  day. ? ?Estimated daily fluid intake: 36 oz ?Supplements: Vit  E, VIt D, Omega 3 ?Sleep: 5-6 hrs  ?Stress / self-care: A lot of stress-anxiety, worries about everything. ?Current average weekly physical activity: ADL ? ?24-Hr Dietary Recall ?Eats 1-2 meals per day. Skips breakfast. Not hungry ? ?Estimated Energy Needs ?Calories: 1200 ?Carbohydrate: 135g ?Protein: 90g ?Fat: 33g ? ? ?NUTRITION DIAGNOSIS  ?NI-1.5 Excessive energy intake As related to Obesity.  As evidenced by BMI 37. ? ? ?NUTRITION INTERVENTION  ?Nutrition education (E-1) on the following topics:  ?Nutrition and Pre Diabetes education provided on My Plate, CHO counting, meal planning, portion sizes, timing of meals, avoiding snacks between meals , benefits of exercising 30 minutes per day and prevention of DM. ?Lifestyle Medicine ? ?- Whole Food, Plant Predominant Nutrition is highly recommended: Eat Plenty of vegetables, Mushrooms, fruits, Legumes, Whole Grains, Nuts, seeds in lieu of processed meats, processed snacks/pastries red meat, poultry, eggs.  ?  ?-It is  better to avoid simple carbohydrates including: Cakes, Sweet Desserts, Ice Cream, Soda (diet and regular), Sweet Tea, Candies, Chips, Cookies, Store Bought Juices, Alcohol in Excess of  1-2 drinks a day, Lemonade,  Artificial Sweeteners, Doughnuts, Coffee Creamers, "Sugar-free" Products, etc, etc.  This is not a complete list..... ? ?Exercise: If you are able: 30 -60 minutes a day ,4 days a week, or 150 minutes a week.  The longer the better.  Combine stretch, strength, and aerobic activities.  If you were told in the past that you have high risk for cardiovascular diseases, you may seek evaluation by your heart doctor prior to initiating moderate to intense exercise programs. ? ? ?Handouts Provided Include  ?Lifestyle Medicine ?Meal Plan Card ?Plant based my plate ?Lifestyle nutrition ? ?Learning Style & Readiness for Change ?Teaching method utilized: Visual & Auditory  ?Demonstrated degree  of understanding via: Teach Back  ?Barriers to learning/adherence to lifestyle change: none ? ?Goals Established by Pt ?Goals ? ?Drink 6 bottles of water per day ?Eat meals on meals on schedule ?Avoiding snacks  ?Keep a food journal-[email or web address] ?Goal is 150 minutes of exercise per week. ?Increase fresh fruits and vegetables. ?Go to lifestylemedicine.org website. ?Look up whole food plant based recipes. ?Lose 1/2-1 lb per week. ? ? ?MONITORING & EVALUATION ?Dietary intake, weekly physical activity, and weight in 1 month. ? ?Next Steps  ?Patient is to work on meal planning and choosing more whole food plant based foods.. ? ?

## 2021-08-04 ENCOUNTER — Other Ambulatory Visit (HOSPITAL_COMMUNITY): Payer: Self-pay | Admitting: Adult Health

## 2021-08-04 DIAGNOSIS — Z1231 Encounter for screening mammogram for malignant neoplasm of breast: Secondary | ICD-10-CM

## 2021-08-05 ENCOUNTER — Other Ambulatory Visit: Payer: Self-pay | Admitting: Gastroenterology

## 2021-08-13 ENCOUNTER — Encounter: Payer: Self-pay | Admitting: Nutrition

## 2021-08-21 DIAGNOSIS — Z713 Dietary counseling and surveillance: Secondary | ICD-10-CM | POA: Diagnosis not present

## 2021-08-21 DIAGNOSIS — Z6835 Body mass index (BMI) 35.0-35.9, adult: Secondary | ICD-10-CM | POA: Diagnosis not present

## 2021-08-21 DIAGNOSIS — Z299 Encounter for prophylactic measures, unspecified: Secondary | ICD-10-CM | POA: Diagnosis not present

## 2021-08-21 DIAGNOSIS — S46212A Strain of muscle, fascia and tendon of other parts of biceps, left arm, initial encounter: Secondary | ICD-10-CM | POA: Diagnosis not present

## 2021-08-21 DIAGNOSIS — I10 Essential (primary) hypertension: Secondary | ICD-10-CM | POA: Diagnosis not present

## 2021-08-28 ENCOUNTER — Encounter: Payer: BC Managed Care – PPO | Attending: Gastroenterology | Admitting: Nutrition

## 2021-08-28 ENCOUNTER — Encounter: Payer: Self-pay | Admitting: Nutrition

## 2021-08-28 VITALS — Ht 60.0 in | Wt 186.0 lb

## 2021-08-28 DIAGNOSIS — K7581 Nonalcoholic steatohepatitis (NASH): Secondary | ICD-10-CM | POA: Insufficient documentation

## 2021-08-28 DIAGNOSIS — E782 Mixed hyperlipidemia: Secondary | ICD-10-CM | POA: Insufficient documentation

## 2021-08-28 DIAGNOSIS — I10 Essential (primary) hypertension: Secondary | ICD-10-CM | POA: Insufficient documentation

## 2021-08-28 DIAGNOSIS — Z6834 Body mass index (BMI) 34.0-34.9, adult: Secondary | ICD-10-CM | POA: Insufficient documentation

## 2021-08-28 NOTE — Patient Instructions (Addendum)
Goals Keep up the great job! Increase exercise 4 times per week and increase distance as tolerated. Continue with eating whole plant based foods Go to the Fullplateliving.org web site. Lose 1/2 to 1 lb per week.

## 2021-08-28 NOTE — Progress Notes (Signed)
Medical Nutrition Therapy 0915  End 8338  Primary concerns today: Fatty Liver, Pre DM, HTN, Obesity Referral diagnosis: K76.0, E66.9, R73.03 Preferred learning style: No preference  Readiness for change : Ready   NUTRITION ASSESSMENT Follow up lifestyle medicine 72 y old w female a with fatty liver, Pre DM, Obesity, Hyperlipidemia and HTN and wants to lose weight. Has not had any red meat. Lost 5 lbs.  Eating a lot more fruits and vegetables. Has done a great job meeting her goals she set for herself and has gotten some positive results to improving her health. Has been feeling fuller. Less bloated. Less cravings. Meal prepping, planning.  Her husband is supportive. Doing a great job.   Goals previously set:  Drink 6 bottles of water per day- Done Eat meals on meals on schedule-done Avoiding snacks -done Keep a food journal-[email or web address].com-done Goal is 150 minutes of exercise per week.-walking.  Increase fresh fruits and vegetables.-done Go to lifestylemedicine.org website. -not yet. Look up whole food plant based recipes.-work on progress Lose 1/2-1 lb per week. DONE!   Anthropometrics   Wt Readings from Last 3 Encounters:  08/28/21 186 lb (84.4 kg)  07/21/21 191 lb (86.6 kg)  07/07/21 191 lb 6.4 oz (86.8 kg)   Ht Readings from Last 3 Encounters:  08/28/21 5' (1.524 m)  07/21/21 5' (1.524 m)  07/07/21 5' (1.524 m)   Body mass index is 36.33 kg/m. @BMIFA @ Facility age limit for growth percentiles is 20 years. Facility age limit for growth percentiles is 20 years.  Clinical Medical Hx: see chart. HTN, Obesity, Fatty Liver, PreDM, Hyperlipidemia Medications: Metformin 500 mg once a day, Crestor Labs: No results found for: HGBA1C    Latest Ref Rng & Units 04/14/2021    9:09 AM 12/03/2020    9:32 PM 10/14/2020    3:02 PM  CMP  Glucose 70 - 99 mg/dL  143     BUN 6 - 20 mg/dL  7     Creatinine 0.44 - 1.00 mg/dL  0.90     Sodium 135 - 145 mmol/L  138     Potassium  3.5 - 5.1 mmol/L  3.7     Chloride 98 - 111 mmol/L  106     CO2 22 - 32 mmol/L  25     Calcium 8.9 - 10.3 mg/dL  8.9     Total Protein 6.1 - 8.1 g/dL 6.9    6.9    Total Bilirubin 0.2 - 1.2 mg/dL 0.4    0.4    AST 10 - 35 U/L 36    30    ALT 6 - 29 U/L 41    37     Lipid Panel     Component Value Date/Time   CHOL 239 (H) 05/29/2016 1105   TRIG 178 (H) 05/29/2016 1105   HDL 48 05/29/2016 1105   CHOLHDL 5.0 (H) 05/29/2016 1105   LDLCALC 155 (H) 05/29/2016 1105   LABVLDL 36 05/29/2016 1105    Notable Signs/Symptoms: none  Lifestyle & Dietary Hx Lost her mom and father. Admits to stressful eating. Married with her husband. Takes care of grandkids. Eats 2 meals per day.  Estimated daily fluid intake: 36 oz Supplements: Vit  E, VIt D, Omega 3 Sleep: 5-6 hrs  Stress / self-care: A lot of stress-anxiety, worries about everything. Current average weekly physical activity: ADL  24-Hr Dietary Recall B) Wheat toast with egg white and blueberries and water L) grilled chicken on spinach wrap, water,  apple D) Grilled chicken, broccoli and raspberries., water   Estimated Energy Needs Calories: 1200 Carbohydrate: 135g Protein: 90g Fat: 33g   NUTRITION DIAGNOSIS  NI-1.5 Excessive energy intake As related to Obesity.  As evidenced by BMI 37.   NUTRITION INTERVENTION  Nutrition education (E-1) on the following topics:  Nutrition and Pre Diabetes education provided on My Plate, CHO counting, meal planning, portion sizes, timing of meals, avoiding snacks between meals , benefits of exercising 30 minutes per day and prevention of DM. Lifestyle Medicine  - Whole Food, Plant Predominant Nutrition is highly recommended: Eat Plenty of vegetables, Mushrooms, fruits, Legumes, Whole Grains, Nuts, seeds in lieu of processed meats, processed snacks/pastries red meat, poultry, eggs.    -It is better to avoid simple carbohydrates including: Cakes, Sweet Desserts, Ice Cream, Soda (diet and  regular), Sweet Tea, Candies, Chips, Cookies, Store Bought Juices, Alcohol in Excess of  1-2 drinks a day, Lemonade,  Artificial Sweeteners, Doughnuts, Coffee Creamers, "Sugar-free" Products, etc, etc.  This is not a complete list.....  Exercise: If you are able: 30 -60 minutes a day ,4 days a week, or 150 minutes a week.  The longer the better.  Combine stretch, strength, and aerobic activities.  If you were told in the past that you have high risk for cardiovascular diseases, you may seek evaluation by your heart doctor prior to initiating moderate to intense exercise programs.   Handouts Provided Include  Lifestyle Medicine Meal Plan Card Plant based my plate Lifestyle nutrition  Learning Style & Readiness for Change Teaching method utilized: Visual & Auditory  Demonstrated degree of understanding via: Teach Back  Barriers to learning/adherence to lifestyle change: none  Goals Established by Pt  Keep up the great job! Increase exercise 4 times per week and increase distance as tolerated. Continue with eating whole plant based foods Go to the Fullplateliving.org web site. Lose 1/2 to 1 lb per week.    MONITORING & EVALUATION Dietary intake, weekly physical activity, and weight in 1 month.  Next Steps  Patient is to work on meal planning and choosing more whole food plant based foods.Marland Kitchen

## 2021-09-04 DIAGNOSIS — F419 Anxiety disorder, unspecified: Secondary | ICD-10-CM | POA: Diagnosis not present

## 2021-09-04 DIAGNOSIS — Z299 Encounter for prophylactic measures, unspecified: Secondary | ICD-10-CM | POA: Diagnosis not present

## 2021-09-04 DIAGNOSIS — M7520 Bicipital tendinitis, unspecified shoulder: Secondary | ICD-10-CM | POA: Diagnosis not present

## 2021-09-04 DIAGNOSIS — E1165 Type 2 diabetes mellitus with hyperglycemia: Secondary | ICD-10-CM | POA: Diagnosis not present

## 2021-09-04 DIAGNOSIS — I10 Essential (primary) hypertension: Secondary | ICD-10-CM | POA: Diagnosis not present

## 2021-09-05 ENCOUNTER — Ambulatory Visit (HOSPITAL_COMMUNITY)
Admission: RE | Admit: 2021-09-05 | Discharge: 2021-09-05 | Disposition: A | Discharge status: Home / Self Care | Payer: BC Managed Care – PPO | Source: Ambulatory Visit | Attending: Adult Health | Admitting: Adult Health

## 2021-09-05 DIAGNOSIS — Z1231 Encounter for screening mammogram for malignant neoplasm of breast: Secondary | ICD-10-CM | POA: Diagnosis not present

## 2021-10-09 ENCOUNTER — Ambulatory Visit: Payer: BC Managed Care – PPO | Admitting: Nutrition

## 2021-10-10 DIAGNOSIS — F332 Major depressive disorder, recurrent severe without psychotic features: Secondary | ICD-10-CM | POA: Diagnosis not present

## 2021-10-10 DIAGNOSIS — E1165 Type 2 diabetes mellitus with hyperglycemia: Secondary | ICD-10-CM | POA: Diagnosis not present

## 2021-10-10 DIAGNOSIS — I10 Essential (primary) hypertension: Secondary | ICD-10-CM | POA: Diagnosis not present

## 2021-10-10 DIAGNOSIS — M7521 Bicipital tendinitis, right shoulder: Secondary | ICD-10-CM | POA: Diagnosis not present

## 2021-10-10 DIAGNOSIS — Z299 Encounter for prophylactic measures, unspecified: Secondary | ICD-10-CM | POA: Diagnosis not present

## 2021-10-16 ENCOUNTER — Encounter: Payer: BC Managed Care – PPO | Attending: Internal Medicine | Admitting: Nutrition

## 2021-10-16 ENCOUNTER — Encounter: Payer: Self-pay | Admitting: Nutrition

## 2021-10-16 VITALS — Ht 60.0 in | Wt 184.0 lb

## 2021-10-16 DIAGNOSIS — Z713 Dietary counseling and surveillance: Secondary | ICD-10-CM | POA: Insufficient documentation

## 2021-10-16 DIAGNOSIS — R7303 Prediabetes: Secondary | ICD-10-CM

## 2021-10-16 DIAGNOSIS — K7581 Nonalcoholic steatohepatitis (NASH): Secondary | ICD-10-CM | POA: Diagnosis not present

## 2021-10-16 DIAGNOSIS — I10 Essential (primary) hypertension: Secondary | ICD-10-CM

## 2021-10-16 DIAGNOSIS — E782 Mixed hyperlipidemia: Secondary | ICD-10-CM

## 2021-10-16 NOTE — Progress Notes (Signed)
Medical Nutrition Therapy 0915  End 0945 Primary concerns today: Fatty Liver, Pre DM, HTN, Obesity Referral diagnosis: K76.0, E66.9, R73.03 Preferred learning style: No preference  Readiness for change : Ready   NUTRITION ASSESSMENT Follow up lifestyle medicine 30 y old w female a with fatty liver, Pre DM, Obesity, Hyperlipidemia and HTN and wants to lose weight. Has lost 2 lbs. Eating more fruits and vegetables. Eating more chicken and [place]. Hasn't been able to  exercise yet but has been drinking  more water and mindful about food choices.  Bowels are much better. GI is working better. Less bloated. Has been eating apple before meals and that helps fill her up and not overeat.  She notes she thinks she'll start losing weight once she starts exercising regularly.  Currently takes care of her 3 grand kids and that is very stressful.  Anthropometrics   Wt Readings from Last 3 Encounters:  08/28/21 186 lb (84.4 kg)  07/21/21 191 lb (86.6 kg)  07/07/21 191 lb 6.4 oz (86.8 kg)   Ht Readings from Last 3 Encounters:  08/28/21 5' (1.524 m)  07/21/21 5' (1.524 m)  07/07/21 5' (1.524 m)   There is no height or weight on file to calculate BMI. @BMIFA @ Facility age limit for growth %iles is 20 years. Facility age limit for growth %iles is 20 years.  Clinical Medical Hx: see chart. HTN, Obesity, Fatty Liver, PreDM, Hyperlipidemia Medications: Metformin 500 mg once a day, Crestor Labs: No results found for: "HGBA1C"    Latest Ref Rng & Units 04/14/2021    9:09 AM 12/03/2020    9:32 PM 10/14/2020    3:02 PM  CMP  Glucose 70 - 99 mg/dL  143    BUN 6 - 20 mg/dL  7    Creatinine 0.44 - 1.00 mg/dL  0.90    Sodium 135 - 145 mmol/L  138    Potassium 3.5 - 5.1 mmol/L  3.7    Chloride 98 - 111 mmol/L  106    CO2 22 - 32 mmol/L  25    Calcium 8.9 - 10.3 mg/dL  8.9    Total Protein 6.1 - 8.1 g/dL 6.9   6.9   Total Bilirubin 0.2 - 1.2 mg/dL 0.4   0.4   AST 10 - 35 U/L 36   30   ALT 6 - 29  U/L 41   37    Lipid Panel     Component Value Date/Time   CHOL 239 (H) 05/29/2016 1105   TRIG 178 (H) 05/29/2016 1105   HDL 48 05/29/2016 1105   CHOLHDL 5.0 (H) 05/29/2016 1105   LDLCALC 155 (H) 05/29/2016 1105   LABVLDL 36 05/29/2016 1105    Notable Signs/Symptoms: none  Lifestyle & Dietary Hx Lost her mom and father. Admits to stressful eating. Married with her husband. Takes care of grandkids. Eats 2 meals per day.  Estimated daily fluid intake: 36 oz Supplements: Vit  E, VIt D, Omega 3 Sleep: 5-6 hrs  Stress / self-care: A lot of stress-anxiety, worries about everything. Current average weekly physical activity: ADL  24-Hr Dietary Recall B) Wheat toast with egg white and blueberries and water L) grilled chicken on spinach wrap, water, apple D) Grilled chicken, broccoli and raspberries., water   Estimated Energy Needs Calories: 1200 Carbohydrate: 135g Protein: 90g Fat: 33g   NUTRITION DIAGNOSIS  NI-1.5 Excessive energy intake As related to Obesity.  As evidenced by BMI 37.   NUTRITION INTERVENTION  Nutrition education (  E-1) on the following topics:  Nutrition and Pre Diabetes education provided on My Plate, CHO counting, meal planning, portion sizes, timing of meals, avoiding snacks between meals , benefits of exercising 30 minutes per day and prevention of DM. Lifestyle Medicine  - Whole Food, Plant Predominant Nutrition is highly recommended: Eat Plenty of vegetables, Mushrooms, fruits, Legumes, Whole Grains, Nuts, seeds in lieu of processed meats, processed snacks/pastries red meat, poultry, eggs.    -It is better to avoid simple carbohydrates including: Cakes, Sweet Desserts, Ice Cream, Soda (diet and regular), Sweet Tea, Candies, Chips, Cookies, Store Bought Juices, Alcohol in Excess of  1-2 drinks a day, Lemonade,  Artificial Sweeteners, Doughnuts, Coffee Creamers, "Sugar-free" Products, etc, etc.  This is not a complete list.....  Exercise: If you are  able: 30 -60 minutes a day ,4 days a week, or 150 minutes a week.  The longer the better.  Combine stretch, strength, and aerobic activities.  If you were told in the past that you have high risk for cardiovascular diseases, you may seek evaluation by your heart doctor prior to initiating moderate to intense exercise programs.   Handouts Provided Include  Lifestyle Medicine Meal Plan Card Plant based my plate Lifestyle nutrition  Learning Style & Readiness for Change Teaching method utilized: Visual & Auditory  Demonstrated degree of understanding via: Teach Back  Barriers to learning/adherence to lifestyle change: none  Goals Established by Pt   Walk  30 minutes 3 times per week.-Wednesday , Thursday and Friday. Lose 2 lbs per month.  MONITORING & EVALUATION Dietary intake, weekly physical activity, and weight in 1 month.  Recommend to follow up on labs for liver enzymes, A1C, and lipid profile in 6 months or less.  Next Steps  Patient is to work on meal planning and choosing more whole food plant based foods.Marland Kitchen

## 2021-10-16 NOTE — Patient Instructions (Addendum)
Goals  Walk  30 minutes 3 times per week.-Wednesday , Thursday and Friday. Lose 2 lbs per month.

## 2021-10-29 ENCOUNTER — Other Ambulatory Visit: Payer: Self-pay | Admitting: Gastroenterology

## 2021-10-29 DIAGNOSIS — K219 Gastro-esophageal reflux disease without esophagitis: Secondary | ICD-10-CM

## 2021-11-13 ENCOUNTER — Encounter (HOSPITAL_COMMUNITY): Payer: Self-pay

## 2021-11-13 ENCOUNTER — Ambulatory Visit (HOSPITAL_COMMUNITY): Payer: BC Managed Care – PPO | Attending: Internal Medicine

## 2021-11-13 DIAGNOSIS — G8929 Other chronic pain: Secondary | ICD-10-CM | POA: Diagnosis not present

## 2021-11-13 DIAGNOSIS — M25511 Pain in right shoulder: Secondary | ICD-10-CM | POA: Insufficient documentation

## 2021-11-13 DIAGNOSIS — R29898 Other symptoms and signs involving the musculoskeletal system: Secondary | ICD-10-CM | POA: Insufficient documentation

## 2021-11-13 NOTE — Therapy (Signed)
OUTPATIENT OCCUPATIONAL THERAPY ORTHO EVALUATION  Patient Name: Loretta Schaefer MRN: [id number] DOB:[birth year], 51 y.o., female Today's Date: 11/13/2021  PCP: Tamela Gammon, NP REFERRING PROVIDER: Jerene Bears, MD   OT End of Session - 11/13/21 1703     Visit Number 1    Number of Visits 12    Date for OT Re-Evaluation 12/26/21    Authorization Type BCBS COMM PPO    Authorization Time Period no auth, visit limit 60 (48 left)    OT Start Time 1550    OT Stop Time 1617    OT Time Calculation (min) 27 min    Activity Tolerance Patient tolerated treatment well    Behavior During Therapy [facility] for tasks assessed/performed             Past Medical History:  Diagnosis Date   Anxiety    Condyloma    Depression    Fibrocystic changes of left breast 05/29/2016   GERD (gastroesophageal reflux disease)    History of pyelonephritis 2003   right   Hyperplastic colon polyp 06/25/2010   colonoscopy by Dr. Gala Romney   Hypertension    history of,is not on any medicqtions   NASH (nonalcoholic steatohepatitis)    Peri-menopause 02/06/2014   Prediabetes    RA (rheumatoid arthritis) ([place])    Past Surgical History:  Procedure Laterality Date   ABDOMINAL HYSTERECTOMY     BREAST BIOPSY Left 03/2016   [place]     x 2   COLONOSCOPY  05/2010   normal TI, hyperplastic rectal polyp, friable anal canal   COLONOSCOPY N/A 07/11/2015   One 5 mm polyp in the descending colon, s/p resection and retrieval. Internal hemorrhoids. Hyperplastic polyp. Surveillance 2022.    COLONOSCOPY WITH PROPOFOL N/A 09/26/2020   Surgeon: Daneil Dolin, MD;   6 mm polyp in the ileocecal valve removed.  Pathology with tubular adenoma.  Recommended 5-year repeat.   ESOPHAGOGASTRODUODENOSCOPY  05/2010   distal ERE, small hh   ESOPHAGOGASTRODUODENOSCOPY N/A 11/13/2013   JSH:FWYOV hiatal hernia; otherwise, negative EGD.Nostigmata of cirrhosis foundPatient may not have cirrhosis at    [place]  2000's   [facility]   PANNICULECTOMY  2005   POLYPECTOMY  09/26/2020   Procedure: POLYPECTOMY;  Surgeon: Daneil Dolin, MD;  [facility];  Service: Endoscopy;;   RADIOACTIVE SEED GUIDED EXCISIONAL BREAST BIOPSY Left 10/08/2016   Procedure: RADIOACTIVE SEED GUIDED EXCISIONAL LEFT  BREAST BIOPSY;  Surgeon: Rolm Bookbinder, MD;  Location: [place];  Service: General;  Laterality: Left;   S/P Hysterectomy  2005   TOE SURGERY     surgery on left big toe at joint   [place] EXTRACTION     Patient Active Problem List   Diagnosis Date Noted   Boil, vulva 11/23/2019   Encounter for screening fecal occult blood testing 11/03/2019   Encounter for well woman exam with routine gynecological exam 11/03/2019   S/P hysterectomy 11/03/2019   Well woman exam with routine gynecological exam 06/11/2017   Depression 06/11/2017   Abnormal mammogram of left breast 07/03/2016   Body mass index 34.0-34.9, adult 07/03/2016   Weight loss counseling, encounter for 07/03/2016   Fibrocystic changes of left breast 05/29/2016   History of colonic polyps 07/03/2015   Peri-menopause 02/06/2014   Condyloma 07/05/2012   Anxiety 04/14/2012   Gastroesophageal reflux disease 01/14/2011   NASH (nonalcoholic steatohepatitis) 06/11/2010    ONSET DATE:  May 2023  REFERRING DIAG: Biceps tendinitis  THERAPY DIAG:  Chronic right shoulder pain  Other symptoms and signs involving the musculoskeletal system  Rationale for Evaluation and Treatment Rehabilitation  SUBJECTIVE:   SUBJECTIVE STATEMENT: "I have been using it because I didn't want it to get stiff or worse. It just is hard to describe how it feels." Pt accompanied by: self  PERTINENT HISTORY: Pt reports right shoulder pain that started in May 2023 that has gotten progressively worse. She was referred to occupational therapy by Dr. Woody Seller for biceps tendinitis and is schedule to return for a follow up  apt in September.    PRECAUTIONS: None  WEIGHT BEARING RESTRICTIONS No  PAIN:  Are you having pain? Yes: NPRS scale: 7/10 Pain location: origin of biceps  Pain description: "burning on the inside, throbbing" Aggravating factors: certain movements  Relieving factors: -  FALLS: Has patient fallen in last 6 months? No  LIVING ENVIRONMENT: Lives with: lives with their spouse Lives in: House/apartment   PLOF: Independent  PATIENT GOALS "To be able to use it without being in pain."  OBJECTIVE:   HAND DOMINANCE: Right  ADLs: Overall ADLs: Pt is able to complete all ADLs independently but has increased pain with various tasks including with laundry and lifting grandchildren. The pain is impacting pt's pain as she prefers to sleep on her right side.   Pt watches her grandchildren 3x/week.   FUNCTIONAL OUTCOME MEASURES: Quick Dash: 45.45  UPPER EXTREMITY ROM      WNL  UPPER EXTREMITY MMT:     MMT Right eval  Shoulder flexion 5/5  Shoulder abduction 4/5*  Shoulder internal rotation 5/5  Shoulder external rotation 4/5*  Elbow flexion 5/5  Elbow extension 5/5  (Blank rows = not tested)   *Pt reports pain with testing   HAND FUNCTION: Grip strength: Right: 48 lbs; Left: 55 lbs   COGNITION: Overall cognitive status: Within functional limits for tasks assessed   OBSERVATIONS: Significant muscle restrictions and tenderness throughout entire right upper quadrant, especially at origins of bicep tendons    TODAY'S TREATMENT:  Evaluation and HEP establishment    PATIENT EDUCATION: Education details: Ice and rest  Person educated: Patient Education method: Explanation Education comprehension: verbalized understanding   HOME EXERCISE PROGRAM: Eval: Ice and rest   GOALS: Goals reviewed with patient? Yes   LONG TERM GOALS: Target date:  12/26/21     Pt will be provided with and educated on HEP to decrease pain in RUE required for ADL completion.   Goal  status: INITIAL  2.  Pt will decrease pain in RUE to 2/10 or less in order to sleep for 4+ consecutive hours without waking due to pain.   Goal status: INITIAL  3.  Pt will decrease RUE fascial restrictions to minimal amounts or less to decrease pain and allow for mobility required for various childcare tasks.   Goal status: INITIAL  4.  Pt will increase strength in LUE to 5/5 to improve ability to perform lifting tasks required for house work including laundry.   Goal status: INITIAL   ASSESSMENT:  CLINICAL IMPRESSION: Patient is a 51 y.o. female who was seen today for occupational therapy evaluation for biceps tendinitis resulting in right shoulder pain with certain movements and lifting. 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 She presents today with shoulder abduction and ER weakness  and pain with MMT in those ranges. In addition, significant restrictions throughout right UE noted along with increased tenderness especially at origin of biceps tendons. Discussed the importance of rest and ice to decrease inflammation.   PERFORMANCE DEFICITS in functional skills including ADLs, IADLs, ROM, strength, pain, fascial restrictions, muscle spasms, and UE functional use.  IMPAIRMENTS are limiting patient from ADLs, IADLs, rest and sleep, work, and leisure.   COMORBIDITIES has no other co-morbidities that affects occupational performance. Patient will benefit from skilled OT to address above impairments and improve overall function.  MODIFICATION OR ASSISTANCE TO COMPLETE EVALUATION: No modification of tasks or assist necessary to complete an evaluation.  OT OCCUPATIONAL PROFILE AND HISTORY: Problem focused assessment: Including review of records relating to presenting problem.  CLINICAL DECISION MAKING: LOW - limited treatment options, no task  modification necessary  REHAB POTENTIAL: Good  EVALUATION COMPLEXITY: Low      PLAN: OT FREQUENCY: 2x/week  OT DURATION: 6 weeks  PLANNED INTERVENTIONS: self care/ADL training, therapeutic exercise, therapeutic activity, manual therapy, passive range of motion, electrical stimulation, ultrasound, moist heat, cryotherapy, patient/family education, energy conservation, and coping strategies training   CONSULTED AND AGREED WITH PLAN OF CARE: Patient  PLAN FOR NEXT SESSION: Manual, ktape, ultrasound, electrical stimulation   Flonnie Hailstone, OTD, OTR/L [phone]  11/13/2021, 5:06 PM

## 2021-11-20 ENCOUNTER — Encounter (HOSPITAL_COMMUNITY): Payer: Self-pay

## 2021-11-20 ENCOUNTER — Ambulatory Visit (HOSPITAL_COMMUNITY): Payer: BC Managed Care – PPO

## 2021-11-20 DIAGNOSIS — M25511 Pain in right shoulder: Secondary | ICD-10-CM | POA: Diagnosis not present

## 2021-11-20 DIAGNOSIS — G8929 Other chronic pain: Secondary | ICD-10-CM

## 2021-11-20 DIAGNOSIS — R29898 Other symptoms and signs involving the musculoskeletal system: Secondary | ICD-10-CM

## 2021-11-20 NOTE — Therapy (Signed)
OUTPATIENT OCCUPATIONAL THERAPY TREATMENT NOTE   Patient Name: Loretta Schaefer MRN: [id number] DOB:Jan 15, 1971, 51 y.o., female Today's Date: 11/20/2021  PCP: Tamela Gammon, NP REFERRING PROVIDER: Jerene Bears, MD   OT End of Session - 11/20/21 0813     Visit Number 2    Number of Visits 12    Date for OT Re-Evaluation 12/26/21    Authorization Type BCBS COMM PPO    Authorization Time Period no auth, visit limit 60 (77 left)    OT Start Time 0815    OT Stop Time 0900    OT Time Calculation (min) 45 min    Activity Tolerance Patient tolerated treatment well    Behavior During Therapy [facility] for tasks assessed/performed             Past Medical History:  Diagnosis Date   Anxiety    Condyloma    Depression    Fibrocystic changes of left breast 05/29/2016   GERD (gastroesophageal reflux disease)    History of pyelonephritis 2003   right   Hyperplastic colon polyp 06/25/2010   colonoscopy by Dr. Gala Romney   Hypertension    history of,is not on any medicqtions   NASH (nonalcoholic steatohepatitis)    Peri-menopause 02/06/2014   Prediabetes    RA (rheumatoid arthritis) ([place])    Past Surgical History:  Procedure Laterality Date   ABDOMINAL HYSTERECTOMY     BREAST BIOPSY Left 03/2016   [place]     x 2   COLONOSCOPY  05/2010   normal TI, hyperplastic rectal polyp, friable anal canal   COLONOSCOPY N/A 07/11/2015   One 5 mm polyp in the descending colon, s/p resection and retrieval. Internal hemorrhoids. Hyperplastic polyp. Surveillance 2022.    COLONOSCOPY WITH PROPOFOL N/A 09/26/2020   Surgeon: Daneil Dolin, MD;   6 mm polyp in the ileocecal valve removed.  Pathology with tubular adenoma.  Recommended 5-year repeat.   ESOPHAGOGASTRODUODENOSCOPY  05/2010   distal ERE, small hh   ESOPHAGOGASTRODUODENOSCOPY N/A 11/13/2013   OYD:XAJOI hiatal hernia; otherwise, negative EGD.Nostigmata of cirrhosis foundPatient may not have cirrhosis at    [place]  2000's   [place] [facility]   PANNICULECTOMY  2005   POLYPECTOMY  09/26/2020   Procedure: POLYPECTOMY;  Surgeon: Daneil Dolin, MD;  [facility];  Service: Endoscopy;;   RADIOACTIVE SEED GUIDED EXCISIONAL BREAST BIOPSY Left 10/08/2016   Procedure: RADIOACTIVE SEED GUIDED EXCISIONAL LEFT  BREAST BIOPSY;  Surgeon: Rolm Bookbinder, MD;  Location: [place];  Service: General;  Laterality: Left;   S/P Hysterectomy  2005   TOE SURGERY     surgery on left big toe at joint   [place] EXTRACTION     Patient Active Problem List   Diagnosis Date Noted   Boil, vulva 11/23/2019   Encounter for screening fecal occult blood testing 11/03/2019   Encounter for well woman exam with routine gynecological exam 11/03/2019   S/P hysterectomy 11/03/2019   Well woman exam with routine gynecological exam 06/11/2017   Depression 06/11/2017   Abnormal mammogram of left breast 07/03/2016   Body mass index 34.0-34.9, adult 07/03/2016   Weight loss counseling, encounter for 07/03/2016   Fibrocystic changes of left breast 05/29/2016   History of colonic polyps 07/03/2015   Peri-menopause 02/06/2014   Condyloma 07/05/2012   Anxiety 04/14/2012   Gastroesophageal reflux disease 01/14/2011   NASH (nonalcoholic steatohepatitis) 06/11/2010    ONSET  DATE: May 2023  REFERRING DIAG: Biceps tendinitis  THERAPY DIAG:  Chronic right shoulder pain  Other symptoms and signs involving the musculoskeletal system  Rationale for Evaluation and Treatment Rehabilitation  PERTINENT HISTORY: Pt reports right shoulder pain that started in May 2023 that has gotten progressively worse. She was referred to occupational therapy by Dr. Woody Seller for biceps tendinitis and is schedule to return for a follow up apt in September.  PRECAUTIONS: None  SUBJECTIVE: S: "I've tried not to use it as much. I have been icing."  PAIN:  Are you having pain? Yes: NPRS scale: 7/10 Pain location:  origin of biceps  Pain description: "burning on the inside, throbbing" Aggravating factors: certain movements  Relieving factors: -   OBJECTIVE:   HAND DOMINANCE: Right   ADLs: Overall ADLs: Pt is able to complete all ADLs independently but has increased pain with various tasks including with laundry and lifting grandchildren. The pain is impacting pt's pain as she prefers to sleep on her right side.    Pt watches her grandchildren 3x/week.     FUNCTIONAL OUTCOME MEASURES: Quick Dash: 45.45   UPPER EXTREMITY ROM                  WNL   UPPER EXTREMITY MMT:      MMT Right eval  Shoulder flexion 5/5  Shoulder abduction 4/5*  Shoulder internal rotation 5/5  Shoulder external rotation 4/5*  Elbow flexion 5/5  Elbow extension 5/5  (Blank rows = not tested)              *Pt reports pain with testing    HAND FUNCTION: Grip strength: Right: 48 lbs; Left: 55 lbs     OBSERVATIONS: Significant muscle restrictions and tenderness throughout entire right upper quadrant, especially at origins of bicep tendons      GOALS: Goals reviewed with patient? Yes     LONG TERM GOALS: Target date:  12/26/21      Pt will be provided with and educated on HEP to decrease pain in RUE required for ADL completion.    Goal status: IN PROGRESS    2.  Pt will decrease pain in RUE to 2/10 or less in order to sleep for 4+ consecutive hours without waking due to pain.    Goal status: IN PROGRESS    3.  Pt will decrease RUE fascial restrictions to minimal amounts or less to decrease pain and allow for mobility required for various childcare tasks.    Goal status: IN PROGRESS    4.  Pt will increase strength in LUE to 5/5 to improve ability to perform lifting tasks required for house work including laundry.    Goal status: IN PROGRESS    TODAY'S TREATMENT:  -Manual Therapy: myofascial release, soft tissue mobilization, and trigger point techniques used throughout entire right UE to decrease  fascial restrictions and allow for decreased pain with ROM -P/ROM: 1x10 shoulder flexion, abduction, horizontal adduction, and er/IR -AA/ROM: 1x10 shoulder flexion, protraction, horizontal adduction, er/IR, abduction   PATIENT EDUCATION: Education details: AA/ROM Person educated: Patient Education method: Consulting civil engineer, Media planner, and Handouts Education comprehension: verbalized understanding and returned demonstration   HOME EXERCISE PROGRAM Eval: Ice and Rest  8/24: AA/ROM   ASSESSMENT:   CLINICAL IMPRESSION: A:Pt presenting for initial treatment session with no significant changes. She reports some temporary relief from ice and that she has been trying to use her right arm less for heavy/repetitive movements. She presents with significant fascial restrictions throughout  RUE, addressed with manual, pt reporting significant tenderness but able to tolerate. Emphasis on manual therapy today to decrease pain which continues to be the pt's primary complaint. Progressed to P/ROM with end range stretch and then to AA/ROM. Therapist providing verbal cues to minimize compensation with AA/ROM. Added to HEP.    PLAN:  OT FREQUENCY: 2x/week   OT DURATION: 6 weeks   PLANNED INTERVENTIONS: self care/ADL training, therapeutic exercise, therapeutic activity, manual therapy, passive range of motion, electrical stimulation, ultrasound, moist heat, cryotherapy, patient/family education, energy conservation, and coping strategies training     CONSULTED AND AGREED WITH PLAN OF CARE: Patient   PLAN FOR NEXT SESSION: Manual, ktape, ultrasound, electrical stimulation         [employer], OT 11/20/2021, 12:51 PM

## 2021-11-20 NOTE — Patient Instructions (Signed)
Perform each exercise ____10-15____ reps. 2-3x days.   1) Protraction   Start by holding a wand or cane at chest height.  Next, slowly push the wand outwards in front of your body so that your elbows become fully straightened. Then, return to the original position.     2) Shoulder FLEXION   In the standing position, hold a wand/cane with both arms, palms down on both sides. Raise up the wand/cane allowing your unaffected arm to perform most of the effort. Your affected arm should be partially relaxed.         4) Shoulder ABDUCTION   While holding a wand/cane palm face up on the injured side and palm face down on the uninjured side, slowly raise up your injured arm to the side.        5) Horizontal Abduction/Adduction      Straight arms holding cane at shoulder height, bring cane to right, center, left. Repeat starting to left.   Copyright  VHI. All rights reserved.

## 2021-11-21 ENCOUNTER — Ambulatory Visit (HOSPITAL_COMMUNITY): Payer: BC Managed Care – PPO

## 2021-11-21 ENCOUNTER — Encounter (HOSPITAL_COMMUNITY): Payer: Self-pay

## 2021-11-21 DIAGNOSIS — G8929 Other chronic pain: Secondary | ICD-10-CM | POA: Diagnosis not present

## 2021-11-21 DIAGNOSIS — R29898 Other symptoms and signs involving the musculoskeletal system: Secondary | ICD-10-CM | POA: Diagnosis not present

## 2021-11-21 DIAGNOSIS — M25511 Pain in right shoulder: Secondary | ICD-10-CM | POA: Diagnosis not present

## 2021-11-21 NOTE — Therapy (Signed)
OUTPATIENT OCCUPATIONAL THERAPY TREATMENT NOTE   Patient Name: Loretta Schaefer MRN: [id number] DOB:1970/05/05, 51 y.o., female Today's Date: 11/21/2021  PCP: Tamela Gammon, NP REFERRING PROVIDER: Jerene Bears, MD   OT End of Session - 11/21/21 0947     Visit Number 3    Number of Visits 12    Date for OT Re-Evaluation 12/26/21    Authorization Type BCBS COMM PPO    Authorization Time Period no auth, visit limit 60 (21 left)    OT Start Time 0945    OT Stop Time 1025    OT Time Calculation (min) 40 min    Activity Tolerance Patient tolerated treatment well    Behavior During Therapy [facility] for tasks assessed/performed             Past Medical History:  Diagnosis Date   Anxiety    Condyloma    Depression    Fibrocystic changes of left breast 05/29/2016   GERD (gastroesophageal reflux disease)    History of pyelonephritis 2003   right   Hyperplastic colon polyp 06/25/2010   colonoscopy by Dr. Gala Romney   Hypertension    history of,is not on any medicqtions   NASH (nonalcoholic steatohepatitis)    Peri-menopause 02/06/2014   Prediabetes    RA (rheumatoid arthritis) ([place])    Past Surgical History:  Procedure Laterality Date   ABDOMINAL HYSTERECTOMY     BREAST BIOPSY Left 03/2016   [place]     x 2   COLONOSCOPY  05/2010   normal TI, hyperplastic rectal polyp, friable anal canal   COLONOSCOPY N/A 07/11/2015   One 5 mm polyp in the descending colon, s/p resection and retrieval. Internal hemorrhoids. Hyperplastic polyp. Surveillance 2022.    COLONOSCOPY WITH PROPOFOL N/A 09/26/2020   Surgeon: Daneil Dolin, MD;   6 mm polyp in the ileocecal valve removed.  Pathology with tubular adenoma.  Recommended 5-year repeat.   ESOPHAGOGASTRODUODENOSCOPY  05/2010   distal ERE, small hh   ESOPHAGOGASTRODUODENOSCOPY N/A 11/13/2013   GYJ:EHUDJ hiatal hernia; otherwise, negative EGD.Nostigmata of cirrhosis foundPatient may not have cirrhosis at    [place]  2000's   [place] [facility]   PANNICULECTOMY  2005   POLYPECTOMY  09/26/2020   Procedure: POLYPECTOMY;  Surgeon: Daneil Dolin, MD;  [facility];  Service: Endoscopy;;   RADIOACTIVE SEED GUIDED EXCISIONAL BREAST BIOPSY Left 10/08/2016   Procedure: RADIOACTIVE SEED GUIDED EXCISIONAL LEFT  BREAST BIOPSY;  Surgeon: Rolm Bookbinder, MD;  Location: [place];  Service: General;  Laterality: Left;   S/P Hysterectomy  2005   TOE SURGERY     surgery on left big toe at joint   [place] EXTRACTION     Patient Active Problem List   Diagnosis Date Noted   Boil, vulva 11/23/2019   Encounter for screening fecal occult blood testing 11/03/2019   Encounter for well woman exam with routine gynecological exam 11/03/2019   S/P hysterectomy 11/03/2019   Well woman exam with routine gynecological exam 06/11/2017   Depression 06/11/2017   Abnormal mammogram of left breast 07/03/2016   Body mass index 34.0-34.9, adult 07/03/2016   Weight loss counseling, encounter for 07/03/2016   Fibrocystic changes of left breast 05/29/2016   History of colonic polyps 07/03/2015   Peri-menopause 02/06/2014   Condyloma 07/05/2012   Anxiety 04/14/2012   Gastroesophageal reflux disease 01/14/2011   NASH (nonalcoholic steatohepatitis) 06/11/2010    ONSET  DATE: May 2023  REFERRING DIAG: Biceps tendinitis  THERAPY DIAG:  Chronic right shoulder pain  Other symptoms and signs involving the musculoskeletal system  Rationale for Evaluation and Treatment Rehabilitation  PERTINENT HISTORY: Pt reports right shoulder pain that started in May 2023 that has gotten progressively worse. She was referred to occupational therapy by Dr. Woody Seller for biceps tendinitis and is schedule to return for a follow up apt in September.  PRECAUTIONS: None  SUBJECTIVE: S: "It's been really sore."  PAIN:  Are you having pain? Yes: NPRS scale: 5/10 Pain location: origin of biceps  Pain  description: "burning on the inside, throbbing" Aggravating factors: certain movements  Relieving factors: -   OBJECTIVE:   HAND DOMINANCE: Right   ADLs: Overall ADLs: Pt is able to complete all ADLs independently but has increased pain with various tasks including with laundry and lifting grandchildren. The pain is impacting pt's pain as she prefers to sleep on her right side.    Pt watches her grandchildren 3x/week.     FUNCTIONAL OUTCOME MEASURES: Quick Dash: 45.45   UPPER EXTREMITY ROM                  WNL   UPPER EXTREMITY MMT:      MMT Right eval  Shoulder flexion 5/5  Shoulder abduction 4/5*  Shoulder internal rotation 5/5  Shoulder external rotation 4/5*  Elbow flexion 5/5  Elbow extension 5/5  (Blank rows = not tested)              *Pt reports pain with testing    HAND FUNCTION: Grip strength: Right: 48 lbs; Left: 55 lbs     OBSERVATIONS: Significant muscle restrictions and tenderness throughout entire right upper quadrant, especially at origins of bicep tendons      GOALS: Goals reviewed with patient? Yes     LONG TERM GOALS: Target date:  12/26/21      Pt will be provided with and educated on HEP to decrease pain in RUE required for ADL completion.    Goal status: IN PROGRESS    2.  Pt will decrease pain in RUE to 2/10 or less in order to sleep for 4+ consecutive hours without waking due to pain.    Goal status: IN PROGRESS    3.  Pt will decrease RUE fascial restrictions to minimal amounts or less to decrease pain and allow for mobility required for various childcare tasks.    Goal status: IN PROGRESS    4.  Pt will increase strength in LUE to 5/5 to improve ability to perform lifting tasks required for house work including laundry.    Goal status: IN PROGRESS    TODAY'S TREATMENT:   11/21/21 -P/ROM: 1x10 with prolonged stretch each direction, shoulder flexion, abduction, IR/er, abducted er/IR, horizontal adduction, horizontal abduction     -Standing dowel latissimus stretch, 2x30" alternating top hand -Shoulder isometrics: 5x5" flexion, extension, IR, er, abduction -Scapular strengthening: 1x10 row, extension, red theraband  11/20/21 -Manual Therapy: myofascial release, soft tissue mobilization, and trigger point techniques used throughout entire right UE to decrease fascial restrictions and allow for decreased pain with ROM -P/ROM: 1x10 shoulder flexion, abduction, horizontal adduction, and er/IR -AA/ROM: 1x10 shoulder flexion, protraction, horizontal adduction, er/IR, abduction   PATIENT EDUCATION: Education details: Shoulder Isometrics, lat stretch   Person educated: Patient Education method: Explanation, Demonstration, and Handouts Education comprehension: verbalized understanding and returned demonstration   [place]: Ice and Rest  8/24: AA/ROM 8/25: shoulder  Isometrics, lat stretch    ASSESSMENT:   CLINICAL IMPRESSION: A:Pt reports increased soreness from yesterday's apt, did not complete manual today. Focused on stretching UE musculature with prolonged hold, pt able to tolerate increased stretch with less discomfort as repetitions progressed. Pt most restricted with abducted IR. Introduced shoulder isometrics and added to HEP. Pt required minimal tactile cues for muscle recruitment with scapular strengthening, benefited from tactile tapping.    PLAN:  OT FREQUENCY: 2x/week   OT DURATION: 6 weeks   PLANNED INTERVENTIONS: self care/ADL training, therapeutic exercise, therapeutic activity, manual therapy, passive range of motion, electrical stimulation, ultrasound, moist heat, cryotherapy, patient/family education, energy conservation, and coping strategies training     CONSULTED AND AGREED WITH PLAN OF CARE: Patient   PLAN FOR NEXT SESSION: Manual, ktape, ultrasound, electrical stimulation. ADD SCAPULAR STRENGTHENING TO HEP.     Flonnie Hailstone, [place], OTR/L [phone]  11/21/2021,  10:57 AM

## 2021-11-21 NOTE — Patient Instructions (Signed)
  Complete the following 1 a day. Hold for 5 seconds. Complete 5 sets for each.   1) SHOULDER - ISOMETRIC FLEXION  Gently push your fist forward into a wall with your elbow bent.    2) SHOULDER - ISOMETRIC EXTENSION  Gently push your a bent elbow back into a wall.    3) SHOULDER - ISOMETRIC INTERNAL ROTATION   Gently press your hand into a wall using the palm side of your hand.  Maintain a bent elbow the entire time.        4) SHOULDER - ISOMETRIC ADDUCTION  Gently push your elbow into the side of your body.   5) SHOULDER - ISOMETRIC ABDUCTION  Gently push your elbow out to the side into a wall with your elbow bent.

## 2021-11-27 ENCOUNTER — Ambulatory Visit (HOSPITAL_COMMUNITY): Payer: BC Managed Care – PPO | Admitting: Occupational Therapy

## 2021-11-27 ENCOUNTER — Encounter (HOSPITAL_COMMUNITY): Payer: Self-pay | Admitting: Occupational Therapy

## 2021-11-27 DIAGNOSIS — R29898 Other symptoms and signs involving the musculoskeletal system: Secondary | ICD-10-CM

## 2021-11-27 DIAGNOSIS — M25511 Pain in right shoulder: Secondary | ICD-10-CM | POA: Diagnosis not present

## 2021-11-27 DIAGNOSIS — G8929 Other chronic pain: Secondary | ICD-10-CM | POA: Diagnosis not present

## 2021-11-27 NOTE — Patient Instructions (Signed)

## 2021-11-27 NOTE — Therapy (Signed)
OUTPATIENT OCCUPATIONAL THERAPY TREATMENT NOTE   Patient Name: Loretta Schaefer MRN: [id number] DOB:1970-04-17, 51 y.o., female Today's Date: 11/27/2021  PCP: Tamela Gammon, NP REFERRING PROVIDER: Jerene Bears, MD   OT End of Session - 11/27/21 1133     Visit Number 4    Number of Visits 12    Date for OT Re-Evaluation 12/26/21    Authorization Type BCBS COMM PPO    Authorization Time Period no auth, visit limit 60 (33 left)    OT Start Time 0901    OT Stop Time 0946    OT Time Calculation (min) 45 min    Activity Tolerance Patient tolerated treatment well    Behavior During Therapy [facility] for tasks assessed/performed              Past Medical History:  Diagnosis Date   Anxiety    Condyloma    Depression    Fibrocystic changes of left breast 05/29/2016   GERD (gastroesophageal reflux disease)    History of pyelonephritis 2003   right   Hyperplastic colon polyp 06/25/2010   colonoscopy by Dr. Gala Romney   Hypertension    history of,is not on any medicqtions   NASH (nonalcoholic steatohepatitis)    Peri-menopause 02/06/2014   Prediabetes    RA (rheumatoid arthritis) ([place])    Past Surgical History:  Procedure Laterality Date   ABDOMINAL HYSTERECTOMY     BREAST BIOPSY Left 03/2016   [place]     x 2   COLONOSCOPY  05/2010   normal TI, hyperplastic rectal polyp, friable anal canal   COLONOSCOPY N/A 07/11/2015   One 5 mm polyp in the descending colon, s/p resection and retrieval. Internal hemorrhoids. Hyperplastic polyp. Surveillance 2022.    COLONOSCOPY WITH PROPOFOL N/A 09/26/2020   Surgeon: Daneil Dolin, MD;   6 mm polyp in the ileocecal valve removed.  Pathology with tubular adenoma.  Recommended 5-year repeat.   ESOPHAGOGASTRODUODENOSCOPY  05/2010   distal ERE, small hh   ESOPHAGOGASTRODUODENOSCOPY N/A 11/13/2013   WJX:BJYNW hiatal hernia; otherwise, negative EGD.Nostigmata of cirrhosis foundPatient may not have cirrhosis at    [place]  2000's   [facility]   PANNICULECTOMY  2005   POLYPECTOMY  09/26/2020   Procedure: POLYPECTOMY;  Surgeon: Daneil Dolin, MD;  [facility];  Service: Endoscopy;;   RADIOACTIVE SEED GUIDED EXCISIONAL BREAST BIOPSY Left 10/08/2016   Procedure: RADIOACTIVE SEED GUIDED EXCISIONAL LEFT  BREAST BIOPSY;  Surgeon: Rolm Bookbinder, MD;  Location: [facility];  Service: General;  Laterality: Left;   S/P Hysterectomy  2005   TOE SURGERY     surgery on left big toe at joint   [place] EXTRACTION     Patient Active Problem List   Diagnosis Date Noted   Boil, vulva 11/23/2019   Encounter for screening fecal occult blood testing 11/03/2019   Encounter for well woman exam with routine gynecological exam 11/03/2019   S/P hysterectomy 11/03/2019   Well woman exam with routine gynecological exam 06/11/2017   Depression 06/11/2017   Abnormal mammogram of left breast 07/03/2016   Body mass index 34.0-34.9, adult 07/03/2016   Weight loss counseling, encounter for 07/03/2016   Fibrocystic changes of left breast 05/29/2016   History of colonic polyps 07/03/2015   Peri-menopause 02/06/2014   Condyloma 07/05/2012   Anxiety 04/14/2012   Gastroesophageal reflux disease 01/14/2011   NASH (nonalcoholic steatohepatitis) 06/11/2010  ONSET DATE: May 2023  REFERRING DIAG: Biceps tendinitis  THERAPY DIAG:  Chronic right shoulder pain  Other symptoms and signs involving the musculoskeletal system  Rationale for Evaluation and Treatment Rehabilitation  PERTINENT HISTORY: Pt reports right shoulder pain that started in May 2023 that has gotten progressively worse. She was referred to occupational therapy by Dr. Woody Seller for biceps tendinitis and is schedule to return for a follow up apt in September.  PRECAUTIONS: None  SUBJECTIVE: S: "The pain was so bad last night, I couldn't sleep."  PAIN:  Are you having pain? Yes: NPRS scale: 7/10 Pain location:  origin of biceps, forearm, and shoulder girdle  Pain description: "burning on the inside, throbbing, aching" Aggravating factors: certain movements  Relieving factors: none   OBJECTIVE:   HAND DOMINANCE: Right   ADLs: Overall ADLs: Pt is able to complete all ADLs independently but has increased pain with various tasks including with laundry and lifting grandchildren. The pain is impacting pt's pain as she prefers to sleep on her right side.    Pt watches her grandchildren 3x/week.     FUNCTIONAL OUTCOME MEASURES: Quick Dash: 45.45   UPPER EXTREMITY ROM                  WNL   UPPER EXTREMITY MMT:      MMT Right eval  Shoulder flexion 5/5  Shoulder abduction 4/5*  Shoulder internal rotation 5/5  Shoulder external rotation 4/5*  Elbow flexion 5/5  Elbow extension 5/5  (Blank rows = not tested)              *Pt reports pain with testing    HAND FUNCTION: Grip strength: Right: 48 lbs; Left: 55 lbs     OBSERVATIONS: Significant muscle restrictions and tenderness throughout entire right upper quadrant, especially at origins of bicep tendons      GOALS: Goals reviewed with patient? Yes     LONG TERM GOALS: Target date:  12/26/21      Pt will be provided with and educated on HEP to decrease pain in RUE required for ADL completion.    Goal status: IN PROGRESS    2.  Pt will decrease pain in RUE to 2/10 or less in order to sleep for 4+ consecutive hours without waking due to pain.    Goal status: IN PROGRESS    3.  Pt will decrease RUE fascial restrictions to minimal amounts or less to decrease pain and allow for mobility required for various childcare tasks.    Goal status: IN PROGRESS    4.  Pt will increase strength in LUE to 5/5 to improve ability to perform lifting tasks required for house work including laundry.    Goal status: IN PROGRESS   TODAY'S TREATMENT:  11/27/21 -Manual Therapy: myofascial release, soft tissue mobilization, and trigger point  techniques used throughout entire right UE to decrease fascial restrictions and allow for decreased pain with ROM -AA/ROM: supine, flexion, abduction, er/IR, horizontal abduction, prolonged hold at end range, 1x10 reps with 10 sec holds. -Stretching: dowel latissimus stretch, bicep on the wall, extension/flexion forearm stretches, 2x30" hold -scapular strengthening: red theraband, retraction, extension, row, 1x10  11/21/21 -P/ROM: 1x10 with prolonged stretch each direction, shoulder flexion, abduction, IR/er, abducted er/IR, horizontal adduction, horizontal abduction    -Standing dowel latissimus stretch, 2x30" alternating top hand -Shoulder isometrics: 5x5" flexion, extension, IR, er, abduction -Scapular strengthening: 1x10 row, extension, red theraband  11/20/21 -Manual Therapy: myofascial release, soft tissue mobilization, and trigger  point techniques used throughout entire right UE to decrease fascial restrictions and allow for decreased pain with ROM -P/ROM: 1x10 shoulder flexion, abduction, horizontal adduction, and er/IR -AA/ROM: 1x10 shoulder flexion, protraction, horizontal adduction, er/IR, abduction   PATIENT EDUCATION: Education details: English as a second language teacher HEP  Person educated: Patient Education method: Explanation, Demonstration, and Handouts Education comprehension: verbalized understanding and returned demonstration   HOME EXERCISE PROGRAM Eval: Ice and Rest  8/24: AA/ROM 8/25: shoulder Isometrics, lat stretch  8/31: Scapular Strengthening w/ Red Theraband   ASSESSMENT:   CLINICAL IMPRESSION: A: Pt reporting difficulty sleeping due to pain in RUE. Focused on manual therapy to address significant fascial restriction in axillary region, biceps, and forearm. Pt tolerated AA/ROM with prolonged hold for stretching this session, as well as A/ROM bicep and forearm stretching. Therapist providing mod verbal cuing for positioning and proper body mechanics in order to address  correct muscle groups and limiting hypermobility of elbow joint. With most exercises this session pt initially hyperextending through the elbow and requiring multimodal cuing to minimal bend the elbow, in order to limit further tension on stress on joints and tendons. Therapist provided pt with scapular strengthening HEP in order to strengthen shoulder girdle and limit stress on the bicep musculature.    PLAN:  OT FREQUENCY: 2x/week   OT DURATION: 6 weeks   PLANNED INTERVENTIONS: self care/ADL training, therapeutic exercise, therapeutic activity, manual therapy, passive range of motion, electrical stimulation, ultrasound, moist heat, cryotherapy, patient/family education, energy conservation, and coping strategies training     CONSULTED AND AGREED WITH PLAN OF CARE: Patient   PLAN FOR NEXT SESSION: Manual, ktape, ultrasound, electrical stimulation. Review HEP, add light resistance for bicep strengthening and stretching bicep and forearms.      Paulita Fujita, OTR/L [phone]  11/27/2021, 11:34 AM

## 2021-11-28 ENCOUNTER — Ambulatory Visit (HOSPITAL_COMMUNITY): Payer: BC Managed Care – PPO | Attending: Internal Medicine

## 2021-11-28 ENCOUNTER — Encounter (HOSPITAL_COMMUNITY): Payer: Self-pay

## 2021-11-28 DIAGNOSIS — M25511 Pain in right shoulder: Secondary | ICD-10-CM | POA: Diagnosis present

## 2021-11-28 DIAGNOSIS — R29898 Other symptoms and signs involving the musculoskeletal system: Secondary | ICD-10-CM

## 2021-11-28 DIAGNOSIS — G8929 Other chronic pain: Secondary | ICD-10-CM

## 2021-11-28 NOTE — Therapy (Signed)
OUTPATIENT OCCUPATIONAL THERAPY TREATMENT NOTE   Patient Name: Loretta Schaefer MRN: [id number] DOB:[birth year], 51 y.o., female Today's Date: 11/28/2021  PCP: Tamela Gammon, NP REFERRING PROVIDER: Jerene Bears, MD   OT End of Session - 11/28/21 1007     Visit Number 5    Number of Visits 12    Date for OT Re-Evaluation 12/26/21    Authorization Type BCBS COMM PPO    Authorization Time Period no auth, visit limit 60 (3 left)    OT Start Time 0900    OT Stop Time 0946    OT Time Calculation (min) 46 min    Activity Tolerance Patient tolerated treatment well    Behavior During Therapy [facility] for tasks assessed/performed               Past Medical History:  Diagnosis Date   Anxiety    Condyloma    Depression    Fibrocystic changes of left breast 05/29/2016   GERD (gastroesophageal reflux disease)    History of pyelonephritis 2003   right   Hyperplastic colon polyp 06/25/2010   colonoscopy by Dr. Gala Romney   Hypertension    history of,is not on any medicqtions   NASH (nonalcoholic steatohepatitis)    Peri-menopause 02/06/2014   Prediabetes    RA (rheumatoid arthritis) ([place])    Past Surgical History:  Procedure Laterality Date   ABDOMINAL HYSTERECTOMY     BREAST BIOPSY Left 03/2016   [place]     x 2   COLONOSCOPY  05/2010   normal TI, hyperplastic rectal polyp, friable anal canal   COLONOSCOPY N/A 07/11/2015   One 5 mm polyp in the descending colon, s/p resection and retrieval. Internal hemorrhoids. Hyperplastic polyp. Surveillance 2022.    COLONOSCOPY WITH PROPOFOL N/A 09/26/2020   Surgeon: Daneil Dolin, MD;   6 mm polyp in the ileocecal valve removed.  Pathology with tubular adenoma.  Recommended 5-year repeat.   ESOPHAGOGASTRODUODENOSCOPY  05/2010   distal ERE, small hh   ESOPHAGOGASTRODUODENOSCOPY N/A 11/13/2013   PJA:SNKNL hiatal hernia; otherwise, negative EGD.Nostigmata of cirrhosis foundPatient may not have cirrhosis at    [place]  2000's   [place] [facility]   PANNICULECTOMY  2005   POLYPECTOMY  09/26/2020   Procedure: POLYPECTOMY;  Surgeon: Daneil Dolin, MD;  [facility];  Service: Endoscopy;;   RADIOACTIVE SEED GUIDED EXCISIONAL BREAST BIOPSY Left 10/08/2016   Procedure: RADIOACTIVE SEED GUIDED EXCISIONAL LEFT  BREAST BIOPSY;  Surgeon: Rolm Bookbinder, MD;  Location: [place];  Service: General;  Laterality: Left;   S/P Hysterectomy  2005   TOE SURGERY     surgery on left big toe at joint   [place] EXTRACTION     Patient Active Problem List   Diagnosis Date Noted   Boil, vulva 11/23/2019   Encounter for screening fecal occult blood testing 11/03/2019   Encounter for well woman exam with routine gynecological exam 11/03/2019   S/P hysterectomy 11/03/2019   Well woman exam with routine gynecological exam 06/11/2017   Depression 06/11/2017   Abnormal mammogram of left breast 07/03/2016   Body mass index 34.0-34.9, adult 07/03/2016   Weight loss counseling, encounter for 07/03/2016   Fibrocystic changes of left breast 05/29/2016   History of colonic polyps 07/03/2015   Peri-menopause 02/06/2014   Condyloma 07/05/2012   Anxiety 04/14/2012   Gastroesophageal reflux disease 01/14/2011   NASH (nonalcoholic steatohepatitis) 06/11/2010  ONSET DATE: May 2023  REFERRING DIAG: Biceps tendinitis  THERAPY DIAG:  Chronic right shoulder pain  Other symptoms and signs involving the musculoskeletal system  Rationale for Evaluation and Treatment Rehabilitation  PERTINENT HISTORY: Pt reports right shoulder pain that started in May 2023 that has gotten progressively worse. She was referred to occupational therapy by Dr. Woody Seller for biceps tendinitis and is schedule to return for a follow up apt in September.  PRECAUTIONS: None  SUBJECTIVE: S: "I slept a little better last night and It doesn't hurt quite as bad."  PAIN:  Are you having pain? Yes: NPRS scale:  5/10 Pain location: origin of biceps, forearm, and shoulder girdle  Pain description: "burning on the inside, throbbing, aching" Aggravating factors: certain movements  Relieving factors: none   OBJECTIVE:   HAND DOMINANCE: Right   ADLs: Overall ADLs: Pt is able to complete all ADLs independently but has increased pain with various tasks including with laundry and lifting grandchildren. The pain is impacting pt's pain as she prefers to sleep on her right side.    Pt watches her grandchildren 3x/week.     FUNCTIONAL OUTCOME MEASURES: Quick Dash: 45.45   UPPER EXTREMITY ROM                  WNL   UPPER EXTREMITY MMT:      MMT Right eval  Shoulder flexion 5/5  Shoulder abduction 4/5*  Shoulder internal rotation 5/5  Shoulder external rotation 4/5*  Elbow flexion 5/5  Elbow extension 5/5  (Blank rows = not tested)              *Pt reports pain with testing    HAND FUNCTION: Grip strength: Right: 48 lbs; Left: 55 lbs     OBSERVATIONS: Significant muscle restrictions and tenderness throughout entire right upper quadrant, especially at origins of bicep tendons      GOALS: Goals reviewed with patient? Yes     LONG TERM GOALS: Target date:  12/26/21      Pt will be provided with and educated on HEP to decrease pain in RUE required for ADL completion.    Goal status: IN PROGRESS    2.  Pt will decrease pain in RUE to 2/10 or less in order to sleep for 4+ consecutive hours without waking due to pain.    Goal status: IN PROGRESS    3.  Pt will decrease RUE fascial restrictions to minimal amounts or less to decrease pain and allow for mobility required for various childcare tasks.    Goal status: IN PROGRESS    4.  Pt will increase strength in LUE to 5/5 to improve ability to perform lifting tasks required for house work including laundry.    Goal status: IN PROGRESS   TODAY'S TREATMENT:   11/28/21 -Manual Therapy: myofascial release, soft tissue mobilization,  and trigger point techniques used throughout entire right UE to decrease fascial restrictions and allow for decreased pain with ROM -IR/er, 2x10, red theraband   -Bicep curl/triceps extension, 2x10, yellow theraband   -Biceps loading, 1x10, shortening biceps followed by prolonged stretch  -Ktape application: anchored at proximal and distal biceps, 50% stretch  11/27/21 -Manual Therapy: myofascial release, soft tissue mobilization, and trigger point techniques used throughout entire right UE to decrease fascial restrictions and allow for decreased pain with ROM -AA/ROM: supine, flexion, abduction, er/IR, horizontal abduction, prolonged hold at end range, 1x10 reps with 10 sec holds. -Stretching: dowel latissimus stretch, bicep on the wall, extension/flexion  forearm stretches, 2x30" hold -scapular strengthening: red theraband, retraction, extension, row, 1x10  11/21/21 -P/ROM: 1x10 with prolonged stretch each direction, shoulder flexion, abduction, IR/er, abducted er/IR, horizontal adduction, horizontal abduction    -Standing dowel latissimus stretch, 2x30" alternating top hand -Shoulder isometrics: 5x5" flexion, extension, IR, er, abduction -Scapular strengthening: 1x10 row, extension, red theraband    PATIENT EDUCATION: Education details: Red theraband IR/er and bicep curl/triceps extension Person educated: Patient Education method: Explanation, Demonstration, and Handouts Education comprehension: verbalized understanding and returned demonstration   HOME EXERCISE PROGRAM Eval: Ice and Rest  8/24: AA/ROM 8/25: shoulder Isometrics, lat stretch  8/31: Scapular Strengthening w/ Red Theraband 9/1: Red theraband IR/er and bicep curl/triceps extension   ASSESSMENT:   CLINICAL IMPRESSION: A: Pt reports that following last session, she feels less tight in her biceps and that she was able to sleep a little better. Noticeably less restrictions with manual therapy today as well as pt able to  tolerate with less discomfort. With some decreased pain and decreased fascial restrictions, continued progression to strengthening and loading biceps. Pt not reporting any increased pain with new exercises, but some muscle fatigue. Therapist initially with moderate verbal cuing for positioning, pt able to demonstrate, added to HEP. Ktape applied to support biceps and decrease pain.    PLAN:  OT FREQUENCY: 2x/week   OT DURATION: 6 weeks   PLANNED INTERVENTIONS: self care/ADL training, therapeutic exercise, therapeutic activity, manual therapy, passive range of motion, electrical stimulation, ultrasound, moist heat, cryotherapy, patient/family education, energy conservation, and coping strategies training     CONSULTED AND AGREED WITH PLAN OF CARE: Patient   PLAN FOR NEXT SESSION: Manual, follow up on ktape, consider ultrasound, electrical stimulation. PRINT HANDOUT OF EXERCISES FROM [address], [place], OTR/L [phone]  11/28/2021, 10:09 AM

## 2021-12-04 ENCOUNTER — Encounter (HOSPITAL_COMMUNITY): Payer: Self-pay

## 2021-12-04 ENCOUNTER — Ambulatory Visit (HOSPITAL_COMMUNITY): Payer: BC Managed Care – PPO

## 2021-12-04 DIAGNOSIS — R29898 Other symptoms and signs involving the musculoskeletal system: Secondary | ICD-10-CM

## 2021-12-04 DIAGNOSIS — G8929 Other chronic pain: Secondary | ICD-10-CM

## 2021-12-04 DIAGNOSIS — M25511 Pain in right shoulder: Secondary | ICD-10-CM | POA: Diagnosis not present

## 2021-12-04 NOTE — Therapy (Signed)
OUTPATIENT OCCUPATIONAL THERAPY TREATMENT NOTE   Patient Name: Loretta Schaefer MRN: [id number] DOB:15-Jan-1971, 51 y.o., female Today's Date: 12/04/2021  PCP: Tamela Gammon, NP REFERRING PROVIDER: Jerene Bears, MD   OT End of Session - 12/04/21 0905     Visit Number 6    Number of Visits 12    Date for OT Re-Evaluation 12/26/21    Authorization Type BCBS COMM PPO    Authorization Time Period no auth, visit limit 60 (44 left)    OT Start Time 0903    OT Stop Time 0943    OT Time Calculation (min) 40 min    Activity Tolerance Patient tolerated treatment well    Behavior During Therapy [facility] for tasks assessed/performed               Past Medical History:  Diagnosis Date   Anxiety    Condyloma    Depression    Fibrocystic changes of left breast 05/29/2016   GERD (gastroesophageal reflux disease)    History of pyelonephritis 2003   right   Hyperplastic colon polyp 06/25/2010   colonoscopy by Dr. Gala Romney   Hypertension    history of,is not on any medicqtions   NASH (nonalcoholic steatohepatitis)    Peri-menopause 02/06/2014   Prediabetes    RA (rheumatoid arthritis) ([place])    Past Surgical History:  Procedure Laterality Date   ABDOMINAL HYSTERECTOMY     BREAST BIOPSY Left 03/2016   [place]     x 2   COLONOSCOPY  05/2010   normal TI, hyperplastic rectal polyp, friable anal canal   COLONOSCOPY N/A 07/11/2015   One 5 mm polyp in the descending colon, s/p resection and retrieval. Internal hemorrhoids. Hyperplastic polyp. Surveillance 2022.    COLONOSCOPY WITH PROPOFOL N/A 09/26/2020   Surgeon: Daneil Dolin, MD;   6 mm polyp in the ileocecal valve removed.  Pathology with tubular adenoma.  Recommended 5-year repeat.   ESOPHAGOGASTRODUODENOSCOPY  05/2010   distal ERE, small hh   ESOPHAGOGASTRODUODENOSCOPY N/A 11/13/2013   KGM:WNUUV hiatal hernia; otherwise, negative EGD.Nostigmata of cirrhosis foundPatient may not have cirrhosis at    [place]  2000's   [facility]   PANNICULECTOMY  2005   POLYPECTOMY  09/26/2020   Procedure: POLYPECTOMY;  Surgeon: Daneil Dolin, MD;  [facility];  Service: Endoscopy;;   RADIOACTIVE SEED GUIDED EXCISIONAL BREAST BIOPSY Left 10/08/2016   Procedure: RADIOACTIVE SEED GUIDED EXCISIONAL LEFT  BREAST BIOPSY;  Surgeon: Rolm Bookbinder, MD;  Location: [place];  Service: General;  Laterality: Left;   S/P Hysterectomy  2005   TOE SURGERY     surgery on left big toe at joint   [place] EXTRACTION     Patient Active Problem List   Diagnosis Date Noted   Boil, vulva 11/23/2019   Encounter for screening fecal occult blood testing 11/03/2019   Encounter for well woman exam with routine gynecological exam 11/03/2019   S/P hysterectomy 11/03/2019   Well woman exam with routine gynecological exam 06/11/2017   Depression 06/11/2017   Abnormal mammogram of left breast 07/03/2016   Body mass index 34.0-34.9, adult 07/03/2016   Weight loss counseling, encounter for 07/03/2016   Fibrocystic changes of left breast 05/29/2016   History of colonic polyps 07/03/2015   Peri-menopause 02/06/2014   Condyloma 07/05/2012   Anxiety 04/14/2012   Gastroesophageal reflux disease 01/14/2011   NASH (nonalcoholic steatohepatitis) 06/11/2010  ONSET DATE: May 2023  REFERRING DIAG: Biceps tendinitis  THERAPY DIAG:  Chronic right shoulder pain  Other symptoms and signs involving the musculoskeletal system  Rationale for Evaluation and Treatment Rehabilitation  PERTINENT HISTORY: Pt reports right shoulder pain that started in May 2023 that has gotten progressively worse. She was referred to occupational therapy by Dr. Woody Seller for biceps tendinitis and is schedule to return for a follow up apt in September.  PRECAUTIONS: None  SUBJECTIVE: S: "I little bit more pain, pretty constant. I try to hold the baby on the other side."  PAIN:  Are you having pain? Yes:  NPRS scale: 6/10 Pain location: origin of biceps, forearm, and shoulder girdle  Pain description: "burning on the inside, throbbing, aching" Aggravating factors: certain movements  Relieving factors: none   OBJECTIVE:   HAND DOMINANCE: Right   ADLs: Overall ADLs: Pt is able to complete all ADLs independently but has increased pain with various tasks including with laundry and lifting grandchildren. The pain is impacting pt's pain as she prefers to sleep on her right side.    Pt watches her grandchildren 3x/week.     FUNCTIONAL OUTCOME MEASURES: Quick Dash: 45.45   UPPER EXTREMITY ROM                  WNL   UPPER EXTREMITY MMT:      MMT Right eval  Shoulder flexion 5/5  Shoulder abduction 4/5*  Shoulder internal rotation 5/5  Shoulder external rotation 4/5*  Elbow flexion 5/5  Elbow extension 5/5  (Blank rows = not tested)              *Pt reports pain with testing    HAND FUNCTION: Grip strength: Right: 48 lbs; Left: 55 lbs     OBSERVATIONS: Significant muscle restrictions and tenderness throughout entire right upper quadrant, especially at origins of bicep tendons      GOALS: Goals reviewed with patient? Yes     LONG TERM GOALS: Target date:  12/26/21      Pt will be provided with and educated on HEP to decrease pain in RUE required for ADL completion.    Goal status: IN PROGRESS    2.  Pt will decrease pain in RUE to 2/10 or less in order to sleep for 4+ consecutive hours without waking due to pain.    Goal status: IN PROGRESS    3.  Pt will decrease RUE fascial restrictions to minimal amounts or less to decrease pain and allow for mobility required for various childcare tasks.    Goal status: IN PROGRESS    4.  Pt will increase strength in LUE to 5/5 to improve ability to perform lifting tasks required for house work including laundry.    Goal status: IN PROGRESS   TODAY'S TREATMENT:   12/04/21 -Manual Therapy: myofascial release, soft tissue  mobilization, and trigger point techniques used throughout entire right UE to decrease fascial restrictions and allow for decreased pain with ROM   -Triceps extension, 2x10, 3 second hold, yellow theraband   -Biceps loading, 1x10, shortening biceps followed by prolonged stretch   11/28/21 -Manual Therapy: myofascial release, soft tissue mobilization, and trigger point techniques used throughout entire right UE to decrease fascial restrictions and allow for decreased pain with ROM -IR/er, 2x10, red theraband   -Bicep curl/triceps extension, 2x10, yellow theraband   -Biceps loading, 1x10, shortening biceps followed by prolonged stretch  -Ktape application: anchored at proximal and distal biceps, 50% stretch  11/27/21 -  Manual Therapy: myofascial release, soft tissue mobilization, and trigger point techniques used throughout entire right UE to decrease fascial restrictions and allow for decreased pain with ROM -AA/ROM: supine, flexion, abduction, er/IR, horizontal abduction, prolonged hold at end range, 1x10 reps with 10 sec holds. -Stretching: dowel latissimus stretch, bicep on the wall, extension/flexion forearm stretches, 2x30" hold -scapular strengthening: red theraband, retraction, extension, row, 1x10    PATIENT EDUCATION: Education details: Reviewed HEP  Person educated: Patient Education method: Explanation, Demonstration, and Handouts Education comprehension: verbalized understanding and returned demonstration   HOME EXERCISE PROGRAM Eval: Ice and Rest  8/24: AA/ROM 8/25: shoulder Isometrics, lat stretch  8/31: Scapular Strengthening w/ Red Theraband 9/1: Red theraband IR/er and bicep curl/triceps extension   ASSESSMENT:   CLINICAL IMPRESSION: A: Pt reports that she has had increased pain over the last several days. Today's session focused on manual to decrease restrictions and pain as well as stretching and loading the biceps. Pt continues to be able to tolerate manual with  less discomfort with decreased restrictions noted. Therapist providing tactile cuing for bicep stretching with HEP. Encouraging pt to complete daily. Less emphasis on strengthening today to allow pain to decrease.    PLAN:  OT FREQUENCY: 2x/week   OT DURATION: 6 weeks   PLANNED INTERVENTIONS: self care/ADL training, therapeutic exercise, therapeutic activity, manual therapy, passive range of motion, electrical stimulation, ultrasound, moist heat, cryotherapy, patient/family education, energy conservation, and coping strategies training     CONSULTED AND AGREED WITH PLAN OF CARE: Patient   PLAN FOR NEXT SESSION: Manual, biceps stretching, slowly reintroduce exercises     Flonnie Hailstone, [place], OTR/L [phone]  12/04/2021, 1:00 PM

## 2021-12-05 ENCOUNTER — Ambulatory Visit (HOSPITAL_COMMUNITY): Payer: BC Managed Care – PPO | Admitting: Occupational Therapy

## 2021-12-05 ENCOUNTER — Encounter (HOSPITAL_COMMUNITY): Payer: Self-pay | Admitting: Occupational Therapy

## 2021-12-05 DIAGNOSIS — R29898 Other symptoms and signs involving the musculoskeletal system: Secondary | ICD-10-CM

## 2021-12-05 DIAGNOSIS — G8929 Other chronic pain: Secondary | ICD-10-CM

## 2021-12-05 DIAGNOSIS — M25511 Pain in right shoulder: Secondary | ICD-10-CM | POA: Diagnosis not present

## 2021-12-05 NOTE — Therapy (Signed)
OUTPATIENT OCCUPATIONAL THERAPY TREATMENT NOTE   Patient Name: Loretta Schaefer MRN: [id number] DOB:1971/03/21, 51 y.o., female Today's Date: 12/05/2021  PCP: Tamela Gammon, NP REFERRING PROVIDER: Jerene Bears, MD   OT End of Session - 12/05/21 1023     Visit Number 7    Number of Visits 12    Date for OT Re-Evaluation 12/26/21    Authorization Type [employer]BS COMM PPO    Authorization Time Period no auth, visit limit 39    OT Start Time 0915    OT Stop Time 1000    OT Time Calculation (min) 45 min    Activity Tolerance Patient tolerated treatment well    Behavior During Therapy [facility] for tasks assessed/performed                Past Medical History:  Diagnosis Date   Anxiety    Condyloma    Depression    Fibrocystic changes of left breast 05/29/2016   GERD (gastroesophageal reflux disease)    History of pyelonephritis 2003   right   Hyperplastic colon polyp 06/25/2010   colonoscopy by Dr. Gala Romney   Hypertension    history of,is not on any medicqtions   NASH (nonalcoholic steatohepatitis)    Peri-menopause 02/06/2014   Prediabetes    RA (rheumatoid arthritis) ([place])    Past Surgical History:  Procedure Laterality Date   ABDOMINAL HYSTERECTOMY     BREAST BIOPSY Left 03/2016   [place]     x 2   COLONOSCOPY  05/2010   normal TI, hyperplastic rectal polyp, friable anal canal   COLONOSCOPY N/A 07/11/2015   One 5 mm polyp in the descending colon, s/p resection and retrieval. Internal hemorrhoids. Hyperplastic polyp. Surveillance 2022.    COLONOSCOPY WITH PROPOFOL N/A 09/26/2020   Surgeon: Daneil Dolin, MD;   6 mm polyp in the ileocecal valve removed.  Pathology with tubular adenoma.  Recommended 5-year repeat.   ESOPHAGOGASTRODUODENOSCOPY  05/2010   distal ERE, small hh   ESOPHAGOGASTRODUODENOSCOPY N/A 11/13/2013   CHE:NIDPO hiatal hernia; otherwise, negative EGD.Nostigmata of cirrhosis foundPatient may not have cirrhosis at   [place]  2000's   [facility]   PANNICULECTOMY  2005   POLYPECTOMY  09/26/2020   Procedure: POLYPECTOMY;  Surgeon: Daneil Dolin, MD;  [facility];  Service: Endoscopy;;   RADIOACTIVE SEED GUIDED EXCISIONAL BREAST BIOPSY Left 10/08/2016   Procedure: RADIOACTIVE SEED GUIDED EXCISIONAL LEFT  BREAST BIOPSY;  Surgeon: Rolm Bookbinder, MD;  Location: [place];  Service: General;  Laterality: Left;   S/P Hysterectomy  2005   TOE SURGERY     surgery on left big toe at joint   [place] EXTRACTION     Patient Active Problem List   Diagnosis Date Noted   Boil, vulva 11/23/2019   Encounter for screening fecal occult blood testing 11/03/2019   Encounter for well woman exam with routine gynecological exam 11/03/2019   S/P hysterectomy 11/03/2019   Well woman exam with routine gynecological exam 06/11/2017   Depression 06/11/2017   Abnormal mammogram of left breast 07/03/2016   Body mass index 34.0-34.9, adult 07/03/2016   Weight loss counseling, encounter for 07/03/2016   Fibrocystic changes of left breast 05/29/2016   History of colonic polyps 07/03/2015   Peri-menopause 02/06/2014   Condyloma 07/05/2012   Anxiety 04/14/2012   Gastroesophageal reflux disease 01/14/2011   NASH (nonalcoholic steatohepatitis) 06/11/2010  ONSET DATE: May 2023  REFERRING DIAG: Biceps tendinitis  THERAPY DIAG:  Chronic right shoulder pain  Other symptoms and signs involving the musculoskeletal system  Rationale for Evaluation and Treatment Rehabilitation  PERTINENT HISTORY: Pt reports right shoulder pain that started in May 2023 that has gotten progressively worse. She was referred to occupational therapy by Dr. Woody Seller for biceps tendinitis and is schedule to return for a follow up apt in September.  PRECAUTIONS: None  SUBJECTIVE: S: "I feel my bicep with everything I do, the pain has gotten pretty constant, even at rest"  PAIN:  Are you having pain? Yes:  NPRS scale: 7/10 Pain location: origin of biceps, forearm, and shoulder girdle  Pain description: "burning on the inside, throbbing, aching" Aggravating factors: certain movements  Relieving factors: none   OBJECTIVE:   HAND DOMINANCE: Right   ADLs: Overall ADLs: Pt is able to complete all ADLs independently but has increased pain with various tasks including with laundry and lifting grandchildren. The pain is impacting pt's pain as she prefers to sleep on her right side.    Pt watches her grandchildren 3x/week.     FUNCTIONAL OUTCOME MEASURES: Quick Dash: 45.45   UPPER EXTREMITY ROM                  WNL   UPPER EXTREMITY MMT:      MMT Right eval  Shoulder flexion 5/5  Shoulder abduction 4/5*  Shoulder internal rotation 5/5  Shoulder external rotation 4/5*  Elbow flexion 5/5  Elbow extension 5/5  (Blank rows = not tested)              *Pt reports pain with testing    HAND FUNCTION: Grip strength: Right: 48 lbs; Left: 55 lbs     OBSERVATIONS: Significant muscle restrictions and tenderness throughout entire right upper quadrant, especially at origins of bicep tendons      GOALS: Goals reviewed with patient? Yes     LONG TERM GOALS: Target date:  12/26/21      Pt will be provided with and educated on HEP to decrease pain in RUE required for ADL completion.    Goal status: IN PROGRESS    2.  Pt will decrease pain in RUE to 2/10 or less in order to sleep for 4+ consecutive hours without waking due to pain.    Goal status: IN PROGRESS    3.  Pt will decrease RUE fascial restrictions to minimal amounts or less to decrease pain and allow for mobility required for various childcare tasks.    Goal status: IN PROGRESS    4.  Pt will increase strength in LUE to 5/5 to improve ability to perform lifting tasks required for house work including laundry.    Goal status: IN PROGRESS   TODAY'S TREATMENT:  12/05/21 -Manual Therapy: myofascial release, soft tissue  mobilization, and trigger point techniques used throughout entire right UE to decrease fascial restrictions and allow for decreased pain with ROM   -stretching: wrist flexion, wrist extension, latissimus stretch, bicep stretch, 4x20" -Bicep loading: 1x10, shortening bicep followed by prolonged stretch with 2lb weight for extension -Strengthening: supination, pronation, tricep extension, bicep curls, 1x10 yellow theraband -Scap strengthening: Retraction, extension, rows, er/IR, 1x10 red theraband  12/04/21 -Manual Therapy: myofascial release, soft tissue mobilization, and trigger point techniques used throughout entire right UE to decrease fascial restrictions and allow for decreased pain with ROM   -Triceps extension, 2x10, 3 second hold, yellow theraband   -Biceps loading,  1x10, shortening biceps followed by prolonged stretch   11/28/21 -Manual Therapy: myofascial release, soft tissue mobilization, and trigger point techniques used throughout entire right UE to decrease fascial restrictions and allow for decreased pain with ROM -IR/er, 2x10, red theraband   -Bicep curl/triceps extension, 2x10, yellow theraband   -Biceps loading, 1x10, shortening biceps followed by prolonged stretch  -Ktape application: anchored at proximal and distal biceps, 50% stretch   PATIENT EDUCATION: Education details: Supination, pronation, and er/IR with theraband Person educated: Patient Education method: Consulting civil engineer, Demonstration, and Handouts Education comprehension: verbalized understanding and returned demonstration   HOME EXERCISE PROGRAM Eval: Ice and Rest  8/24: AA/ROM 8/25: shoulder Isometrics, lat stretch  8/31: Scapular Strengthening w/ Red Theraband 9/1: Red theraband IR/er and bicep curl/triceps extension 9/8: Yellow theraband supination and pronation   ASSESSMENT:   CLINICAL IMPRESSION: A: Pt reporting that her pain has been pretty constant the past week, limiting her ability to care for her  grand children and completing BADL's without significant pain. She continues to have moderate fascial restrictions in the biceps, forearms, and axillary region, addressed this session with manual therapy and stretching. Therapist added supination/pronation exercises to pt's HEP and pt demonstrated good technique with these exercises, reporting minimal increase in pain. Additionally, pt completed bicep loading task, where therapist provided mod support to elbow, in order to keep RUE in 90 degrees flexion while completing task. During all other exercises, therapist provided tactile and verbal cuing for proper mechanics and positioning.   PLAN:  OT FREQUENCY: 2x/week   OT DURATION: 6 weeks   PLANNED INTERVENTIONS: self care/ADL training, therapeutic exercise, therapeutic activity, manual therapy, passive range of motion, electrical stimulation, ultrasound, moist heat, cryotherapy, patient/family education, energy conservation, and coping strategies training     CONSULTED AND AGREED WITH PLAN OF CARE: Patient   PLAN FOR NEXT SESSION: Manual, biceps stretching, slowly reintroduce exercises     Paulita Fujita, OTR/L [phone]  12/05/2021, 10:24 AM

## 2021-12-17 ENCOUNTER — Encounter: Payer: Self-pay | Admitting: *Deleted

## 2021-12-18 ENCOUNTER — Ambulatory Visit (HOSPITAL_COMMUNITY): Payer: BC Managed Care – PPO | Admitting: Occupational Therapy

## 2021-12-18 ENCOUNTER — Encounter (HOSPITAL_COMMUNITY): Payer: Self-pay | Admitting: Occupational Therapy

## 2021-12-18 DIAGNOSIS — R29898 Other symptoms and signs involving the musculoskeletal system: Secondary | ICD-10-CM

## 2021-12-18 DIAGNOSIS — G8929 Other chronic pain: Secondary | ICD-10-CM

## 2021-12-18 DIAGNOSIS — M25511 Pain in right shoulder: Secondary | ICD-10-CM | POA: Diagnosis not present

## 2021-12-18 NOTE — Therapy (Signed)
OUTPATIENT OCCUPATIONAL THERAPY TREATMENT NOTE   Patient Name: Loretta Schaefer MRN: [id number] DOB:[birth year], 51 y.o., female Today's Date: 12/18/2021  PCP: Tamela Gammon, NP REFERRING PROVIDER: Jerene Bears, MD   OT End of Session - 12/18/21 1024     Visit Number 8    Number of Visits 12    Date for OT Re-Evaluation 12/26/21    Authorization Type [employer] COMM PPO    Authorization Time Period no auth, visit limit 46    OT Start Time 0902    OT Stop Time 0945    OT Time Calculation (min) 43 min    Activity Tolerance Patient tolerated treatment well    Behavior During Therapy [facility] for tasks assessed/performed                 Past Medical History:  Diagnosis Date   Anxiety    Condyloma    Depression    Fibrocystic changes of left breast 05/29/2016   GERD (gastroesophageal reflux disease)    History of pyelonephritis 2003   right   Hyperplastic colon polyp 06/25/2010   colonoscopy by Dr. Gala Romney   Hypertension    history of,is not on any medicqtions   NASH (nonalcoholic steatohepatitis)    Peri-menopause 02/06/2014   Prediabetes    RA (rheumatoid arthritis) ([place])    Past Surgical History:  Procedure Laterality Date   ABDOMINAL HYSTERECTOMY     BREAST BIOPSY Left 03/2016   [place]     x 2   COLONOSCOPY  05/2010   normal TI, hyperplastic rectal polyp, friable anal canal   COLONOSCOPY N/A 07/11/2015   One 5 mm polyp in the descending colon, s/p resection and retrieval. Internal hemorrhoids. Hyperplastic polyp. Surveillance 2022.    COLONOSCOPY WITH PROPOFOL N/A 09/26/2020   Surgeon: Daneil Dolin, MD;   6 mm polyp in the ileocecal valve removed.  Pathology with tubular adenoma.  Recommended 5-year repeat.   ESOPHAGOGASTRODUODENOSCOPY  05/2010   distal ERE, small hh   ESOPHAGOGASTRODUODENOSCOPY N/A 11/13/2013   NAT:FTDDU hiatal hernia; otherwise, negative EGD.Nostigmata of cirrhosis foundPatient may not have cirrhosis at   [place]  2000's   [facility]   PANNICULECTOMY  2005   POLYPECTOMY  09/26/2020   Procedure: POLYPECTOMY;  Surgeon: Daneil Dolin, MD;  [facility];  Service: Endoscopy;;   RADIOACTIVE SEED GUIDED EXCISIONAL BREAST BIOPSY Left 10/08/2016   Procedure: RADIOACTIVE SEED GUIDED EXCISIONAL LEFT  BREAST BIOPSY;  Surgeon: Rolm Bookbinder, MD;  Location: [place];  Service: General;  Laterality: Left;   S/P Hysterectomy  2005   TOE SURGERY     surgery on left big toe at joint   [place] EXTRACTION     Patient Active Problem List   Diagnosis Date Noted   Boil, vulva 11/23/2019   Encounter for screening fecal occult blood testing 11/03/2019   Encounter for well woman exam with routine gynecological exam 11/03/2019   S/P hysterectomy 11/03/2019   Well woman exam with routine gynecological exam 06/11/2017   Depression 06/11/2017   Abnormal mammogram of left breast 07/03/2016   Body mass index 34.0-34.9, adult 07/03/2016   Weight loss counseling, encounter for 07/03/2016   Fibrocystic changes of left breast 05/29/2016   History of colonic polyps 07/03/2015   Peri-menopause 02/06/2014   Condyloma 07/05/2012   Anxiety 04/14/2012   Gastroesophageal reflux disease 01/14/2011   NASH (nonalcoholic steatohepatitis) 06/11/2010  ONSET DATE: May 2023  REFERRING DIAG: Biceps tendinitis  THERAPY DIAG:  Chronic right shoulder pain  Other symptoms and signs involving the musculoskeletal system  Rationale for Evaluation and Treatment Rehabilitation  PERTINENT HISTORY: Pt reports right shoulder pain that started in May 2023 that has gotten progressively worse. She was referred to occupational therapy by Dr. Woody Seller for biceps tendinitis and is schedule to return for a follow up apt in September.  PRECAUTIONS: None  SUBJECTIVE: S: "I would say my arm isn't really feeling worse, but it's not feeling better either."  PAIN:  Are you having pain? Yes: NPRS  scale: 7/10 Pain location: origin of biceps, forearm, and shoulder girdle  Pain description: "throbbing, aching" Aggravating factors: certain movements  Relieving factors: none   OBJECTIVE:   HAND DOMINANCE: Right   ADLs: Overall ADLs: Pt is able to complete all ADLs independently but has increased pain with various tasks including with laundry and lifting grandchildren. The pain is impacting pt's pain as she prefers to sleep on her right side.    Pt watches her grandchildren 3x/week.     FUNCTIONAL OUTCOME MEASURES: Quick Dash: 45.45 12/18/21: 31.82   UPPER EXTREMITY ROM                  WNL   UPPER EXTREMITY MMT:      MMT Right eval Right 12/18/21  Shoulder flexion 5/5 5/5  Shoulder abduction 4/5* 4+/5  Shoulder internal rotation 5/5 5/5  Shoulder external rotation 4/5* 4+/5*  Elbow flexion 5/5 5/5  Elbow extension 5/5 5/5  (Blank rows = not tested)              *Pt reports pain with testing    HAND FUNCTION: Grip strength: Right: 48 lbs; Left: 55 lbs 12/18/21 - Grip Strength: R: 45lbs; L 40lbs     OBSERVATIONS: Significant muscle restrictions and tenderness throughout entire right upper quadrant, especially at origins of bicep tendons      GOALS: Goals reviewed with patient? Yes     LONG TERM GOALS: Target date:  12/26/21      Pt will be provided with and educated on HEP to decrease pain in RUE required for ADL completion.    Goal status: IN PROGRESS    2.  Pt will decrease pain in RUE to 2/10 or less in order to sleep for 4+ consecutive hours without waking due to pain.    Goal status: IN PROGRESS    3.  Pt will decrease RUE fascial restrictions to minimal amounts or less to decrease pain and allow for mobility required for various childcare tasks.    Goal status: IN PROGRESS    4.  Pt will increase strength in RUE to 5/5 to improve ability to perform lifting tasks required for house work including laundry.    Goal status: IN PROGRESS   TODAY'S  TREATMENT:  12/18/21 -Manual Therapy: myofascial release, soft tissue mobilization, and trigger point techniques used throughout entire right UE to decrease fascial restrictions and allow for decreased pain with ROM   -stretching: wrist flexion, wrist extension, bicep stretch, 2x20" -measurements for reassessment -Weighted stretches: ulnar deviation, extension, flexion, 2x20" - Strengthening: pronation, supination, tricep extension, bicep curls, 1x10 yellow theraband  12/05/21 -Manual Therapy: myofascial release, soft tissue mobilization, and trigger point techniques used throughout entire right UE to decrease fascial restrictions and allow for decreased pain with ROM   -stretching: wrist flexion, wrist extension, latissimus stretch, bicep stretch, 4x20" -Bicep loading: 1x10, shortening  bicep followed by prolonged stretch with 2lb weight for extension -Strengthening: supination, pronation, tricep extension, bicep curls, 1x10 yellow theraband -Scap strengthening: Retraction, extension, rows, er/IR, 1x10 red theraband  12/04/21 -Manual Therapy: myofascial release, soft tissue mobilization, and trigger point techniques used throughout entire right UE to decrease fascial restrictions and allow for decreased pain with ROM   -Triceps extension, 2x10, 3 second hold, yellow theraband   -Biceps loading, 1x10, shortening biceps followed by prolonged stretch   PATIENT EDUCATION: Education details: Supination, pronation, and er/IR with theraband Person educated: Patient Education method: Explanation, Demonstration, and Handouts Education comprehension: verbalized understanding and returned demonstration   HOME EXERCISE PROGRAM Eval: Ice and Rest  8/24: AA/ROM 8/25: shoulder Isometrics, lat stretch  8/31: Scapular Strengthening w/ Red Theraband 9/1: Red theraband IR/er and bicep curl/triceps extension 9/8: Yellow theraband supination and pronation   ASSESSMENT:   CLINICAL IMPRESSION: A: Session  focused on continued myofascial release and trigger point to decrease pain and fascial restrictions in the forearm and bicep tendons. Pt able to complete multiple stretches to ease pain in forearm and upper arm. Re-assessed strength this session, which pt has improved well overall in her RUE. She continues to report severe pain consistently throughout her day, despite making some compensatory changes and working on her HEP. Therapist providing tactile and verbal cuing for pt to work on lessening the hyperextension of her R elbow with all extension based tasks, as well as continuing to encourage compensatory strategies for ADL's and IADL's.   PLAN:  OT FREQUENCY: 2x/week   OT DURATION: 6 weeks   PLANNED INTERVENTIONS: self care/ADL training, therapeutic exercise, therapeutic activity, manual therapy, passive range of motion, electrical stimulation, ultrasound, moist heat, cryotherapy, patient/family education, energy conservation, and coping strategies training     CONSULTED AND AGREED WITH PLAN OF CARE: Patient   PLAN FOR NEXT SESSION: Manual, biceps stretching, slowly reintroduce exercises, bicep loading    Paulita Fujita, OTR/L [phone]  12/18/2021, 10:25 AM

## 2021-12-19 ENCOUNTER — Encounter (HOSPITAL_COMMUNITY): Payer: BC Managed Care – PPO | Admitting: Occupational Therapy

## 2021-12-19 DIAGNOSIS — Z299 Encounter for prophylactic measures, unspecified: Secondary | ICD-10-CM | POA: Diagnosis not present

## 2021-12-19 DIAGNOSIS — E1165 Type 2 diabetes mellitus with hyperglycemia: Secondary | ICD-10-CM | POA: Diagnosis not present

## 2021-12-19 DIAGNOSIS — Z789 Other specified health status: Secondary | ICD-10-CM | POA: Diagnosis not present

## 2021-12-19 DIAGNOSIS — I10 Essential (primary) hypertension: Secondary | ICD-10-CM | POA: Diagnosis not present

## 2021-12-25 ENCOUNTER — Telehealth: Payer: Self-pay | Admitting: Internal Medicine

## 2021-12-25 ENCOUNTER — Ambulatory Visit (HOSPITAL_COMMUNITY): Payer: BC Managed Care – PPO | Admitting: Occupational Therapy

## 2021-12-25 DIAGNOSIS — R29898 Other symptoms and signs involving the musculoskeletal system: Secondary | ICD-10-CM

## 2021-12-25 DIAGNOSIS — M25511 Pain in right shoulder: Secondary | ICD-10-CM | POA: Diagnosis not present

## 2021-12-25 DIAGNOSIS — G8929 Other chronic pain: Secondary | ICD-10-CM

## 2021-12-25 NOTE — Telephone Encounter (Signed)
Pt is aware of her OV on 10/27 at 0930 with LSL. She is asking if she needs to have labs and U/S done prior to OV. Please advise. [phone]

## 2021-12-25 NOTE — Therapy (Signed)
OUTPATIENT OCCUPATIONAL THERAPY TREATMENT NOTE   Patient Name: Loretta Schaefer MRN: [id number] DOB:1970-05-12, 51 y.o., female Today's Date: 12/26/2021  PCP: Tamela Gammon, NP REFERRING PROVIDER: Jerene Bears, MD   OT End of Session - 12/25/21 0945     Visit Number 9    Number of Visits 12    Date for OT Re-Evaluation 12/26/21    Authorization Type [employer] COMM PPO    Authorization Time Period no auth, visit limit 60    OT Start Time 0900    OT Stop Time 0945    OT Time Calculation (min) 45 min    Activity Tolerance Patient tolerated treatment well    Behavior During Therapy [facility] for tasks assessed/performed              Past Medical History:  Diagnosis Date   Anxiety    Condyloma    Depression    Fibrocystic changes of left breast 05/29/2016   GERD (gastroesophageal reflux disease)    History of pyelonephritis 2003   right   Hyperplastic colon polyp 06/25/2010   colonoscopy by Dr. Gala Romney   Hypertension    history of,is not on any medicqtions   NASH (nonalcoholic steatohepatitis)    Peri-menopause 02/06/2014   Prediabetes    RA (rheumatoid arthritis) ([place])    Past Surgical History:  Procedure Laterality Date   ABDOMINAL HYSTERECTOMY     BREAST BIOPSY Left 03/2016   [place]     x 2   COLONOSCOPY  05/2010   normal TI, hyperplastic rectal polyp, friable anal canal   COLONOSCOPY N/A 07/11/2015   One 5 mm polyp in the descending colon, s/p resection and retrieval. Internal hemorrhoids. Hyperplastic polyp. Surveillance 2022.    COLONOSCOPY WITH PROPOFOL N/A 09/26/2020   Surgeon: Daneil Dolin, MD;   6 mm polyp in the ileocecal valve removed.  Pathology with tubular adenoma.  Recommended 5-year repeat.   ESOPHAGOGASTRODUODENOSCOPY  05/2010   distal ERE, small hh   ESOPHAGOGASTRODUODENOSCOPY N/A 11/13/2013   SFK:CLEXN hiatal hernia; otherwise, negative EGD.Nostigmata of cirrhosis foundPatient may not have cirrhosis at   [place]  2000's   [place] [facility]   PANNICULECTOMY  2005   POLYPECTOMY  09/26/2020   Procedure: POLYPECTOMY;  Surgeon: Daneil Dolin, MD;  [facility];  Service: Endoscopy;;   RADIOACTIVE SEED GUIDED EXCISIONAL BREAST BIOPSY Left 10/08/2016   Procedure: RADIOACTIVE SEED GUIDED EXCISIONAL LEFT  BREAST BIOPSY;  Surgeon: Rolm Bookbinder, MD;  Location: [place];  Service: General;  Laterality: Left;   S/P Hysterectomy  2005   TOE SURGERY     surgery on left big toe at joint   [place] EXTRACTION     Patient Active Problem List   Diagnosis Date Noted   Boil, vulva 11/23/2019   Encounter for screening fecal occult blood testing 11/03/2019   Encounter for well woman exam with routine gynecological exam 11/03/2019   S/P hysterectomy 11/03/2019   Well woman exam with routine gynecological exam 06/11/2017   Depression 06/11/2017   Abnormal mammogram of left breast 07/03/2016   Body mass index 34.0-34.9, adult 07/03/2016   Weight loss counseling, encounter for 07/03/2016   Fibrocystic changes of left breast 05/29/2016   History of colonic polyps 07/03/2015   Peri-menopause 02/06/2014   Condyloma 07/05/2012   Anxiety 04/14/2012   Gastroesophageal reflux disease 01/14/2011   NASH (nonalcoholic steatohepatitis) 06/11/2010    ONSET DATE:  May 2023  REFERRING DIAG: Biceps tendinitis  THERAPY DIAG:  Chronic right shoulder pain  Other symptoms and signs involving the musculoskeletal system  Rationale for Evaluation and Treatment Rehabilitation  PERTINENT HISTORY: Pt reports right shoulder pain that started in May 2023 that has gotten progressively worse. She was referred to occupational therapy by Dr. Woody Seller for biceps tendinitis and is schedule to return for a follow up apt in September.  PRECAUTIONS: None  SUBJECTIVE: S: "I'm so tired of the constant pain "  PAIN:  Are you having pain? Yes: NPRS scale: 8/10 Pain location: origin of biceps,  forearm, and shoulder girdle  Pain description: "throbbing, aching" Aggravating factors: certain movements  Relieving factors: none   OBJECTIVE:   HAND DOMINANCE: Right   ADLs: Overall ADLs: Pt is able to complete all ADLs independently but has increased pain with various tasks including with laundry and lifting grandchildren. The pain is impacting pt's pain as she prefers to sleep on her right side.    Pt watches her grandchildren 3x/week.     FUNCTIONAL OUTCOME MEASURES: Quick Dash: 45.45 12/18/21: 31.82   UPPER EXTREMITY ROM                  WNL   UPPER EXTREMITY MMT:      MMT Right eval Right 12/18/21  Shoulder flexion 5/5 5/5  Shoulder abduction 4/5* 4+/5  Shoulder internal rotation 5/5 5/5  Shoulder external rotation 4/5* 4+/5*  Elbow flexion 5/5 5/5  Elbow extension 5/5 5/5  (Blank rows = not tested)              *Pt reports pain with testing    HAND FUNCTION: Grip strength: Right: 48 lbs; Left: 55 lbs 12/18/21 - Grip Strength: R: 45lbs; L 40lbs     OBSERVATIONS: Significant muscle restrictions and tenderness throughout entire right upper quadrant, especially at origins of bicep tendons      GOALS: Goals reviewed with patient? Yes     LONG TERM GOALS: Target date:  12/26/21      Pt will be provided with and educated on HEP to decrease pain in RUE required for ADL completion.    Goal status: IN PROGRESS    2.  Pt will decrease pain in RUE to 2/10 or less in order to sleep for 4+ consecutive hours without waking due to pain.    Goal status: IN PROGRESS    3.  Pt will decrease RUE fascial restrictions to minimal amounts or less to decrease pain and allow for mobility required for various childcare tasks.    Goal status: IN PROGRESS    4.  Pt will increase strength in RUE to 5/5 to improve ability to perform lifting tasks required for house work including laundry.    Goal status: IN PROGRESS   TODAY'S TREATMENT:  12/25/21 -Manual Therapy:  myofascial release, soft tissue mobilization, and trigger point techniques used throughout entire right UE to decrease fascial restrictions and allow for decreased pain with ROM   -Stretching: wrist flexion, wrist extension, bicep stretch, 2x20" -Strengthening: bicep curls with slow extension, tricep extension, pronation/supination,  3lb dumbbell, 1x15 -ROM: er/IR in abduction, 1x15  12/18/21 -Manual Therapy: myofascial release, soft tissue mobilization, and trigger point techniques used throughout entire right UE to decrease fascial restrictions and allow for decreased pain with ROM   -stretching: wrist flexion, wrist extension, bicep stretch, 2x20" -measurements for reassessment -Weighted stretches: ulnar deviation, extension, flexion, 2x20" - Strengthening: pronation, supination, tricep extension, bicep curls,  1x10 yellow theraband  12/05/21 -Manual Therapy: myofascial release, soft tissue mobilization, and trigger point techniques used throughout entire right UE to decrease fascial restrictions and allow for decreased pain with ROM   -stretching: wrist flexion, wrist extension, latissimus stretch, bicep stretch, 4x20" -Bicep loading: 1x10, shortening bicep followed by prolonged stretch with 2lb weight for extension -Strengthening: supination, pronation, tricep extension, bicep curls, 1x10 yellow theraband -Scap strengthening: Retraction, extension, rows, er/IR, 1x10 red theraband   PATIENT EDUCATION: Education details: Bicep with dumbbell Person educated: Patient Education method: Explanation, Demonstration, and Handouts Education comprehension: verbalized understanding and returned demonstration   HOME EXERCISE PROGRAM Eval: Ice and Rest  8/24: AA/ROM 8/25: shoulder Isometrics, lat stretch  8/31: Scapular Strengthening w/ Red Theraband 9/1: Red theraband IR/er and bicep curl/triceps extension 9/8: Yellow theraband supination and pronation 9/28: Bicep Loading with  dumbbell   ASSESSMENT:   CLINICAL IMPRESSION: A: Pt reporting on continued higher levels of pain with severe fascial restrictions in her biceps and forearms, utilizing manual techniques and stretching. Continuing to work on strengthening surrounding muscles in the shoulders and forearms to decrease strain on her biceps. Therapist providing education to minimize bicep contractions and encouraging extension based movements. Pt continuing to benefit from skilled OT to minimize pain and improve independence with  ADL's and IADL's.   PLAN:  OT FREQUENCY: 2x/week   OT DURATION: 6 weeks   PLANNED INTERVENTIONS: self care/ADL training, therapeutic exercise, therapeutic activity, manual therapy, passive range of motion, electrical stimulation, ultrasound, moist heat, cryotherapy, patient/family education, energy conservation, and coping strategies training   CONSULTED AND AGREED WITH PLAN OF CARE: Patient   PLAN FOR NEXT SESSION: Manual, biceps stretching, slowly reintroduce exercises, bicep loading, isometrics    Paulita Fujita, OTR/L [phone]  12/26/2021, 12:27 AM

## 2021-12-26 ENCOUNTER — Encounter (HOSPITAL_COMMUNITY): Payer: Self-pay | Admitting: Occupational Therapy

## 2021-12-26 ENCOUNTER — Ambulatory Visit (HOSPITAL_COMMUNITY): Payer: BC Managed Care – PPO | Admitting: Occupational Therapy

## 2021-12-26 DIAGNOSIS — G8929 Other chronic pain: Secondary | ICD-10-CM

## 2021-12-26 DIAGNOSIS — R29898 Other symptoms and signs involving the musculoskeletal system: Secondary | ICD-10-CM

## 2021-12-26 DIAGNOSIS — M25511 Pain in right shoulder: Secondary | ICD-10-CM | POA: Diagnosis not present

## 2021-12-26 NOTE — Therapy (Signed)
OUTPATIENT OCCUPATIONAL THERAPY TREATMENT NOTE   Patient Name: Loretta Schaefer MRN: [id number] DOB:[birth year], 51 y.o., female Today's Date: 12/26/2021  PCP: Tamela Gammon, NP REFERRING PROVIDER: Jerene Bears, MD   OT End of Session - 12/26/21 1550     Visit Number 10    Number of Visits 12    Date for OT Re-Evaluation 12/26/21    Authorization Type BCBS COMM PPO    Authorization Time Period no auth, visit limit 60    OT Start Time 0900    OT Stop Time 0945    OT Time Calculation (min) 45 min    Activity Tolerance Patient tolerated treatment well    Behavior During Therapy [facility] for tasks assessed/performed             Past Medical History:  Diagnosis Date   Anxiety    Condyloma    Depression    Fibrocystic changes of left breast 05/29/2016   GERD (gastroesophageal reflux disease)    History of pyelonephritis 2003   right   Hyperplastic colon polyp 06/25/2010   colonoscopy by Dr. Gala Romney   Hypertension    history of,is not on any medicqtions   NASH (nonalcoholic steatohepatitis)    Peri-menopause 02/06/2014   Prediabetes    RA (rheumatoid arthritis) ([place])    Past Surgical History:  Procedure Laterality Date   ABDOMINAL HYSTERECTOMY     BREAST BIOPSY Left 03/2016   [place]     x 2   COLONOSCOPY  05/2010   normal TI, hyperplastic rectal polyp, friable anal canal   COLONOSCOPY N/A 07/11/2015   One 5 mm polyp in the descending colon, s/p resection and retrieval. Internal hemorrhoids. Hyperplastic polyp. Surveillance 2022.    COLONOSCOPY WITH PROPOFOL N/A 09/26/2020   Surgeon: Daneil Dolin, MD;   6 mm polyp in the ileocecal valve removed.  Pathology with tubular adenoma.  Recommended 5-year repeat.   ESOPHAGOGASTRODUODENOSCOPY  05/2010   distal ERE, small hh   ESOPHAGOGASTRODUODENOSCOPY N/A 11/13/2013   DXI:PJASN hiatal hernia; otherwise, negative EGD.Nostigmata of cirrhosis foundPatient may not have cirrhosis at   Onset  2000's   [facility]   PANNICULECTOMY  2005   POLYPECTOMY  09/26/2020   Procedure: POLYPECTOMY;  Surgeon: Daneil Dolin, MD;  [facility];  Service: Endoscopy;;   RADIOACTIVE SEED GUIDED EXCISIONAL BREAST BIOPSY Left 10/08/2016   Procedure: RADIOACTIVE SEED GUIDED EXCISIONAL LEFT  BREAST BIOPSY;  Surgeon: Rolm Bookbinder, MD;  Location: [place];  Service: General;  Laterality: Left;   S/P Hysterectomy  2005   TOE SURGERY     surgery on left big toe at joint   [place] EXTRACTION     Patient Active Problem List   Diagnosis Date Noted   Boil, vulva 11/23/2019   Encounter for screening fecal occult blood testing 11/03/2019   Encounter for well woman exam with routine gynecological exam 11/03/2019   S/P hysterectomy 11/03/2019   Well woman exam with routine gynecological exam 06/11/2017   Depression 06/11/2017   Abnormal mammogram of left breast 07/03/2016   Body mass index 34.0-34.9, adult 07/03/2016   Weight loss counseling, encounter for 07/03/2016   Fibrocystic changes of left breast 05/29/2016   History of colonic polyps 07/03/2015   Peri-menopause 02/06/2014   Condyloma 07/05/2012   Anxiety 04/14/2012   Gastroesophageal reflux disease 01/14/2011   NASH (nonalcoholic steatohepatitis) 06/11/2010    ONSET DATE: May  2023  REFERRING DIAG: Biceps tendinitis  THERAPY DIAG:  Chronic right shoulder pain  Other symptoms and signs involving the musculoskeletal system  Rationale for Evaluation and Treatment Rehabilitation  PERTINENT HISTORY: Pt reports right shoulder pain that started in May 2023 that has gotten progressively worse. She was referred to occupational therapy by Dr. Woody Seller for biceps tendinitis and is schedule to return for a follow up apt in September.  PRECAUTIONS: None  SUBJECTIVE: S: "I slept terrible last night, I couldn't get comfortable to where the pain was tolerable."  PAIN:  Are you having pain? Yes: NPRS  scale: 8/10 Pain location: origin of biceps, forearm, and shoulder girdle  Pain description: "throbbing, aching" Aggravating factors: certain movements  Relieving factors: none   OBJECTIVE:   HAND DOMINANCE: Right   ADLs: Overall ADLs: Pt is able to complete all ADLs independently but has increased pain with various tasks including with laundry and lifting grandchildren. The pain is impacting pt's pain as she prefers to sleep on her right side.    Pt watches her grandchildren 3x/week.     FUNCTIONAL OUTCOME MEASURES: Quick Dash: 45.45 12/18/21: 31.82   UPPER EXTREMITY ROM                  WNL   UPPER EXTREMITY MMT:      MMT Right eval Right 12/18/21  Shoulder flexion 5/5 5/5  Shoulder abduction 4/5* 4+/5  Shoulder internal rotation 5/5 5/5  Shoulder external rotation 4/5* 4+/5*  Elbow flexion 5/5 5/5  Elbow extension 5/5 5/5  (Blank rows = not tested)              *Pt reports pain with testing    HAND FUNCTION: Grip strength: Right: 48 lbs; Left: 55 lbs 12/18/21 - Grip Strength: R: 45lbs; L 40lbs     OBSERVATIONS: Significant muscle restrictions and tenderness throughout entire right upper quadrant, especially at origins of bicep tendons      GOALS: Goals reviewed with patient? Yes     LONG TERM GOALS: Target date:  12/26/21      Pt will be provided with and educated on HEP to decrease pain in RUE required for ADL completion.    Goal status: IN PROGRESS    2.  Pt will decrease pain in RUE to 2/10 or less in order to sleep for 4+ consecutive hours without waking due to pain.    Goal status: IN PROGRESS    3.  Pt will decrease RUE fascial restrictions to minimal amounts or less to decrease pain and allow for mobility required for various childcare tasks.    Goal status: IN PROGRESS    4.  Pt will increase strength in RUE to 5/5 to improve ability to perform lifting tasks required for house work including laundry.    Goal status: IN PROGRESS   TODAY'S  TREATMENT:  12/26/21 -Manual Therapy: myofascial release, soft tissue mobilization, and trigger point techniques used throughout entire right UE to decrease fascial restrictions and allow for decreased pain with ROM   -Strengthening: 4lb dumbbell, pronation/supination, radial/ulnar deviations, er/IR, tricep extension, bicep curls, shoulder flexion, 1x15 -stretching: Bicep stretch, er/IR, cross body stretch, shoulder flexion, 5x15"  12/25/21 -Manual Therapy: myofascial release, soft tissue mobilization, and trigger point techniques used throughout entire right UE to decrease fascial restrictions and allow for decreased pain with ROM   -Stretching: wrist flexion, wrist extension, bicep stretch, 2x20" -Strengthening: bicep curls with slow extension, tricep extension, pronation/supination,  3lb dumbbell, 1x15 -ROM:  er/IR in abduction, 1x15  12/18/21 -Manual Therapy: myofascial release, soft tissue mobilization, and trigger point techniques used throughout entire right UE to decrease fascial restrictions and allow for decreased pain with ROM   -stretching: wrist flexion, wrist extension, bicep stretch, 2x20" -measurements for reassessment -Weighted stretches: ulnar deviation, extension, flexion, 2x20" - Strengthening: pronation, supination, tricep extension, bicep curls, 1x10 yellow theraband   PATIENT EDUCATION: Education details: Bicep with dumbbell Person educated: Patient Education method: Explanation, Demonstration, and Handouts Education comprehension: verbalized understanding and returned demonstration   HOME EXERCISE PROGRAM Eval: Ice and Rest  8/24: AA/ROM 8/25: shoulder Isometrics, lat stretch  8/31: Scapular Strengthening w/ Red Theraband 9/1: Red theraband IR/er and bicep curl/triceps extension 9/8: Yellow theraband supination and pronation 9/28: Bicep Loading with dumbbell   ASSESSMENT:   CLINICAL IMPRESSION: A: Pt reporting on continued higher levels of pain with severe  fascial restrictions in her biceps and forearms, utilizing manual techniques and stretching. Therapist providing min blocking assist during strengthening exercises to limit elbow and shoulder movement to focus exercises on the wrist and forearms. Session ended with focusing on stretching due to pt's increased pain and limited tolerance. She continues to benefit from skilled OT services to minimize pain and improve independence with  ADL's and IADL's.   PLAN:  OT FREQUENCY: 2x/week   OT DURATION: 6 weeks   PLANNED INTERVENTIONS: self care/ADL training, therapeutic exercise, therapeutic activity, manual therapy, passive range of motion, electrical stimulation, ultrasound, moist heat, cryotherapy, patient/family education, energy conservation, and coping strategies training   CONSULTED AND AGREED WITH PLAN OF CARE: Patient   PLAN FOR NEXT SESSION: Manual, biceps stretching, slowly reintroduce exercises, bicep loading, isometrics    Paulita Fujita, OTR/L [phone]  12/26/2021, 3:51 PM

## 2021-12-31 ENCOUNTER — Encounter (HOSPITAL_COMMUNITY): Payer: Self-pay | Admitting: Occupational Therapy

## 2022-01-09 ENCOUNTER — Encounter (HOSPITAL_COMMUNITY): Payer: Self-pay | Admitting: Occupational Therapy

## 2022-01-09 DIAGNOSIS — M25511 Pain in right shoulder: Secondary | ICD-10-CM | POA: Diagnosis not present

## 2022-01-14 ENCOUNTER — Other Ambulatory Visit (HOSPITAL_COMMUNITY): Payer: Self-pay | Admitting: Orthopedic Surgery

## 2022-01-14 DIAGNOSIS — M75101 Unspecified rotator cuff tear or rupture of right shoulder, not specified as traumatic: Secondary | ICD-10-CM

## 2022-01-15 ENCOUNTER — Ambulatory Visit: Payer: BC Managed Care – PPO | Admitting: Nutrition

## 2022-01-23 ENCOUNTER — Ambulatory Visit: Payer: BC Managed Care – PPO | Admitting: Gastroenterology

## 2022-01-23 ENCOUNTER — Encounter: Payer: Self-pay | Admitting: Gastroenterology

## 2022-01-23 VITALS — BP 142/84 | HR 73 | Temp 98.0°F | Ht 60.0 in | Wt 186.8 lb

## 2022-01-23 DIAGNOSIS — K219 Gastro-esophageal reflux disease without esophagitis: Secondary | ICD-10-CM | POA: Diagnosis not present

## 2022-01-23 DIAGNOSIS — R7989 Other specified abnormal findings of blood chemistry: Secondary | ICD-10-CM | POA: Diagnosis not present

## 2022-01-23 DIAGNOSIS — K6289 Other specified diseases of anus and rectum: Secondary | ICD-10-CM | POA: Diagnosis not present

## 2022-01-23 DIAGNOSIS — K7581 Nonalcoholic steatohepatitis (NASH): Secondary | ICD-10-CM

## 2022-01-23 MED ORDER — HYDROCORTISONE (PERIANAL) 2.5 % EX CREA: 1.0000 | TOPICAL_CREAM | Freq: Two times a day (BID) | CUTANEOUS | 1 refills | Status: DC

## 2022-01-23 MED ORDER — PANTOPRAZOLE SODIUM 40 MG PO TBEC: 40.0000 mg | DELAYED_RELEASE_TABLET | Freq: Every day | ORAL | 3 refills | Status: DC

## 2022-01-23 NOTE — Progress Notes (Signed)
GI Office Note    Referring Provider: Glenda Chroman, MD Primary Care Physician:  Glenda Chroman, MD  Primary Gastroenterologist:Michael Gala Romney, MD   Chief Complaint   Chief Complaint  Patient presents with   Follow-up    Doing well, no issues to discuss.    History of Present Illness   Loretta Schaefer is a 51 y.o. female presenting today for follow-up of GERD, NASH (biopsy in 2000) undergoing yearly ultrasound and 6 months monitoring of LFTs. Mother and Aunt both had NASH cirrhosis. Prior US with elastography showing Metavir score F4. EGD 10/2013 with no evidence of portal HTN. Last seen in 06/2021.   Since pantoprazole reduced to 20 mg after last visit, she has been having recurrent reflux symptoms 2-3 times per week.  She is also started Rybelsus in time as well for prediabetes and weight loss purposes.  She had requested to decrease her dose previously but now she wants to go back to 40 mg daily.  She denies any dysphagia.  She states her A1c is coming down.  She is down about 5 pounds since April 2023.  Denies any abdominal pain.  Bowel movements are regular.  No melena or rectal bleeding.  Since being on Rybelsus for the past few months, she has had a couple episodes per week of discomfort on the left side of her anal area during defecation.  No residual pain after bowel movement.  Denies straining or hard stools.   She states she is not getting any exercise but knows she needs to.  She is drinking water, some coffee under our recommendations, at least 1 diet soda per day.  Completed hepatitis a and B vaccinations remotely.     Previous work up:  Colonoscopy up-to-date June 2022 with 6 mm polyp at the ileocecal valve removed.  Pathology with tubular adenoma.  Recommended 5-year surveillance.  EGD 10/2013: small hiatal hernia, no evidence of portal hypertension  2016: Liver fibrosis/fibrotest F0 2016 [place] with elastography, Metavir score F3/F4 2015 [place] with elastography, Metavir  score F4   Medications   Current Outpatient Medications  Medication Sig Dispense Refill   buPROPion (WELLBUTRIN XL) 150 MG 24 hr tablet Take 150 mg by mouth daily with lunch.     ibuprofen (ADVIL,MOTRIN) 200 MG tablet Take 400 mg by mouth every 6 (six) hours as needed for headache or moderate pain.     loratadine (CLARITIN) 10 MG tablet Take 10 mg by mouth daily.     metoprolol succinate (TOPROL-XL) 25 MG 24 hr tablet Take 25 mg by mouth every evening.     Omega-3 Fatty Acids (FISH OIL) 1000 MG CAPS Take 1,000 mg by mouth daily.     pantoprazole (PROTONIX) 20 MG tablet TAKE 1 TABLET BY MOUTH DAILY BEFORE breakfast 30 tablet 5   rosuvastatin (CRESTOR) 10 MG tablet Take 10 mg by mouth every evening.     RYBELSUS 7 MG TABS Take 1 tablet by mouth daily.     Vitamin D, Ergocalciferol, (DRISDOL) 1.25 MG (50000 UNIT) CAPS capsule Take 50,000 Units by mouth every Saturday.     vitamin E 400 UNIT capsule Take 400 Units by mouth daily.     No current facility-administered medications for this visit.    Allergies   Allergies as of 01/23/2022 - Review Complete 01/23/2022  Allergen Reaction Noted   Omnipaque [iohexol] Hives and Itching 06/02/2010       Review of Systems   General: Negative for anorexia,  weight loss, fever, chills, fatigue, weakness. ENT: Negative for hoarseness, difficulty swallowing , nasal congestion. CV: Negative for chest pain, angina, palpitations, dyspnea on exertion, peripheral edema.  Respiratory: Negative for dyspnea at rest, dyspnea on exertion, cough, sputum, wheezing.  GI: See history of present illness. GU:  Negative for dysuria, hematuria, urinary incontinence, urinary frequency, nocturnal urination.  Endo: Negative for unusual weight change.     Physical Exam   BP (!) 142/84 (BP Location: Right Arm, Patient Position: Sitting, Cuff Size: Normal)   Pulse 73   Temp 98 F (36.7 C) (Oral)   Ht 5' (1.524 m)   Wt 186 lb 12.8 oz (84.7 kg)   SpO2 99%   BMI  36.48 kg/m    General: Well-nourished, well-developed in no acute distress.  Eyes: No icterus. Heart: Regular rate and rhythm, no murmurs rubs or gallops.  Abdomen: Bowel sounds are normal, nontender, nondistended, no hepatosplenomegaly or masses,  no abdominal bruits or hernia , no rebound or guarding.  Rectal: not performed Extremities: No lower extremity edema. No clubbing or deformities. Neuro: Alert and oriented x 4   Skin: Warm and dry, no jaundice.   Psych: Alert and cooperative, normal mood and affect.  Labs   Lab Results  Component Value Date   CREATININE 0.90 12/03/2020   BUN 7 12/03/2020   NA 138 12/03/2020   K 3.7 12/03/2020   CL 106 12/03/2020   CO2 25 12/03/2020   Lab Results  Component Value Date   ALT 41 (H) 04/14/2021   AST 36 (H) 04/14/2021   GGT 15 02/15/2015   ALKPHOS 48 10/02/2016   BILITOT 0.4 04/14/2021   Lab Results  Component Value Date   WBC 8.4 12/03/2020   HGB 14.7 12/03/2020   HCT 43.4 12/03/2020   MCV 88.0 12/03/2020   PLT 278 12/03/2020   No results found for: "INR", "PROTIME"  Imaging Studies   No results found.  Assessment   GERD: Recurrent symptoms with reducing pantoprazole to 20 mg daily.  No alarm symptoms.  Reinforced antireflux measures.  NASH: Biopsy-proven, 2000.  The past several years has had mildly elevated transaminases.  Strong family history of [name] cirrhosis.  Patient previously had F4 scores on elastography but could be falsely elevated.  Opted to monitor her routinely given previous F4 scores, family history.  Recommend tight glycemic control, weight loss efforts, exercise.  Rectal discomfort: Noted during defecation.  Denies straining or hard stools.  Intermittent nature.  Possible hemorrhoidal or fissure.  Colonoscopy up-to-date.  Patient prefers to try topical treatment initially.  She will let us know if she has ongoing symptoms.  PLAN   Ultrasound and labs as scheduled. Increase pantoprazole to 40 mg  daily. Hydrocortisone cream anal rectally twice daily for 2 weeks.  Call with persistent symptoms. Strive for weight loss, exercise, glycemic control. Return to the office in 6 months or sooner if needed.  Laureen Ochs. Bobby Rumpf, [place], [facility]

## 2022-01-23 NOTE — Patient Instructions (Addendum)
Ultrasound and labs as scheduled.  RX for pantoprazole 65m daily sent to pharmacy.  For rectal pain, anusol cream apply inside anal canal twice daily for up to two weeks. Call if persistent symptoms.  Continue to strive for weight loss, exercise, sugar control.  Return to the office in six months or sooner if needed.

## 2022-01-28 ENCOUNTER — Other Ambulatory Visit: Payer: Self-pay

## 2022-01-28 ENCOUNTER — Telehealth: Payer: Self-pay

## 2022-01-28 DIAGNOSIS — K6289 Other specified diseases of anus and rectum: Secondary | ICD-10-CM

## 2022-01-28 DIAGNOSIS — R7989 Other specified abnormal findings of blood chemistry: Secondary | ICD-10-CM

## 2022-01-28 DIAGNOSIS — D509 Iron deficiency anemia, unspecified: Secondary | ICD-10-CM

## 2022-01-28 LAB — IGG, IGA, IGM
IgG (Immunoglobin G), Serum: 1013 mg/dL (ref 600–1640)
IgM, Serum: 97 mg/dL (ref 50–300)
Immunoglobulin A: 369 mg/dL — ABNORMAL HIGH (ref 47–310)

## 2022-01-28 LAB — HEPATIC FUNCTION PANEL
AG Ratio: 1.7 (calc) (ref 1.0–2.5)
ALT: 23 U/L (ref 6–29)
AST: 22 U/L (ref 10–35)
Albumin: 4.6 g/dL (ref 3.6–5.1)
Alkaline phosphatase (APISO): 65 U/L (ref 37–153)
Bilirubin, Direct: 0.1 mg/dL (ref 0.0–0.2)
Globulin: 2.7 g/dL (calc) (ref 1.9–3.7)
Indirect Bilirubin: 0.4 mg/dL (calc) (ref 0.2–1.2)
Total Bilirubin: 0.5 mg/dL (ref 0.2–1.2)
Total Protein: 7.3 g/dL (ref 6.1–8.1)

## 2022-01-28 LAB — IRON,TIBC AND FERRITIN PANEL
%SAT: 17 % (calc) (ref 16–45)
Ferritin: 9 ng/mL — ABNORMAL LOW (ref 16–232)
Iron: 73 ug/dL (ref 45–160)
TIBC: 418 mcg/dL (calc) (ref 250–450)

## 2022-01-28 LAB — MITOCHONDRIAL ANTIBODIES: Mitochondrial M2 Ab, IgG: <=20 U (ref <=20.0)

## 2022-01-28 LAB — ANTI-SMOOTH MUSCLE ANTIBODY, IGG: Actin (Smooth Muscle) Antibody (IGG): <20 U (ref <20)

## 2022-01-28 LAB — ANA: Anti Nuclear Antibody (ANA): NEGATIVE

## 2022-01-28 LAB — TISSUE TRANSGLUTAMINASE, IGA: (tTG) Ab, IgA: <1 U/mL

## 2022-01-28 NOTE — Telephone Encounter (Signed)
Noted  

## 2022-01-28 NOTE — Telephone Encounter (Signed)
Tammy, I have sent you a result note on what we have but some labs are still pending. See result note for instructions.

## 2022-01-28 NOTE — Telephone Encounter (Signed)
Pt called and left a message wanting to know the results to her recent lab work.

## 2022-01-30 DIAGNOSIS — R7989 Other specified abnormal findings of blood chemistry: Secondary | ICD-10-CM | POA: Diagnosis not present

## 2022-01-30 DIAGNOSIS — D509 Iron deficiency anemia, unspecified: Secondary | ICD-10-CM | POA: Diagnosis not present

## 2022-01-30 LAB — CBC WITH DIFFERENTIAL/PLATELET
Absolute Monocytes: 533 cells/uL (ref 200–950)
Basophils Absolute: 58 cells/uL (ref 0–200)
Basophils Relative: 0.8 %
Eosinophils Absolute: 467 cells/uL (ref 15–500)
Eosinophils Relative: 6.4 %
HCT: 40.4 % (ref 35.0–45.0)
Hemoglobin: 13.5 g/dL (ref 11.7–15.5)
Lymphs Abs: 3051 cells/uL (ref 850–3900)
MCH: 28.3 pg (ref 27.0–33.0)
MCHC: 33.4 g/dL (ref 32.0–36.0)
MCV: 84.7 fL (ref 80.0–100.0)
MPV: 10.3 fL (ref 7.5–12.5)
Monocytes Relative: 7.3 %
Neutro Abs: 3190 cells/uL (ref 1500–7800)
Neutrophils Relative %: 43.7 %
Platelets: 310 10*3/uL (ref 140–400)
RBC: 4.77 10*6/uL (ref 3.80–5.10)
RDW: 13.6 % (ref 11.0–15.0)
Total Lymphocyte: 41.8 %
WBC: 7.3 10*3/uL (ref 3.8–10.8)

## 2022-02-03 ENCOUNTER — Ambulatory Visit (HOSPITAL_COMMUNITY): Payer: BC Managed Care – PPO

## 2022-02-03 ENCOUNTER — Ambulatory Visit (INDEPENDENT_AMBULATORY_CARE_PROVIDER_SITE_OTHER): Payer: BC Managed Care – PPO | Admitting: Gastroenterology

## 2022-02-03 DIAGNOSIS — K6289 Other specified diseases of anus and rectum: Secondary | ICD-10-CM | POA: Diagnosis not present

## 2022-02-03 DIAGNOSIS — D509 Iron deficiency anemia, unspecified: Secondary | ICD-10-CM

## 2022-02-03 LAB — IFOBT (OCCULT BLOOD): IFOBT: NEGATIVE

## 2022-02-05 ENCOUNTER — Ambulatory Visit (HOSPITAL_COMMUNITY)
Admission: RE | Admit: 2022-02-05 | Discharge: 2022-02-05 | Disposition: A | Discharge status: Home / Self Care | Payer: BC Managed Care – PPO | Source: Ambulatory Visit | Attending: Gastroenterology | Admitting: Gastroenterology

## 2022-02-05 DIAGNOSIS — K219 Gastro-esophageal reflux disease without esophagitis: Secondary | ICD-10-CM | POA: Diagnosis not present

## 2022-02-05 DIAGNOSIS — K6289 Other specified diseases of anus and rectum: Secondary | ICD-10-CM | POA: Insufficient documentation

## 2022-02-05 DIAGNOSIS — K7581 Nonalcoholic steatohepatitis (NASH): Secondary | ICD-10-CM | POA: Insufficient documentation

## 2022-02-05 DIAGNOSIS — R7989 Other specified abnormal findings of blood chemistry: Secondary | ICD-10-CM | POA: Diagnosis not present

## 2022-02-05 DIAGNOSIS — R945 Abnormal results of liver function studies: Secondary | ICD-10-CM | POA: Diagnosis not present

## 2022-02-06 ENCOUNTER — Ambulatory Visit (HOSPITAL_COMMUNITY)
Admission: RE | Admit: 2022-02-06 | Discharge: 2022-02-06 | Disposition: A | Discharge status: Home / Self Care | Payer: BC Managed Care – PPO | Source: Ambulatory Visit | Attending: Orthopedic Surgery | Admitting: Orthopedic Surgery

## 2022-02-06 DIAGNOSIS — M75101 Unspecified rotator cuff tear or rupture of right shoulder, not specified as traumatic: Secondary | ICD-10-CM | POA: Diagnosis not present

## 2022-02-06 DIAGNOSIS — S46011A Strain of muscle(s) and tendon(s) of the rotator cuff of right shoulder, initial encounter: Secondary | ICD-10-CM | POA: Diagnosis not present

## 2022-02-06 DIAGNOSIS — M19011 Primary osteoarthritis, right shoulder: Secondary | ICD-10-CM | POA: Diagnosis not present

## 2022-02-10 ENCOUNTER — Other Ambulatory Visit: Payer: Self-pay

## 2022-02-10 DIAGNOSIS — D509 Iron deficiency anemia, unspecified: Secondary | ICD-10-CM

## 2022-02-12 DIAGNOSIS — M25511 Pain in right shoulder: Secondary | ICD-10-CM | POA: Diagnosis not present

## 2022-02-27 DIAGNOSIS — M25511 Pain in right shoulder: Secondary | ICD-10-CM | POA: Diagnosis not present

## 2022-02-27 HISTORY — PX: SHOULDER SURGERY: SHX246

## 2022-03-11 DIAGNOSIS — M7551 Bursitis of right shoulder: Secondary | ICD-10-CM | POA: Diagnosis not present

## 2022-03-11 DIAGNOSIS — M7521 Bicipital tendinitis, right shoulder: Secondary | ICD-10-CM | POA: Diagnosis not present

## 2022-03-11 DIAGNOSIS — M19011 Primary osteoarthritis, right shoulder: Secondary | ICD-10-CM | POA: Diagnosis not present

## 2022-03-11 DIAGNOSIS — M25811 Other specified joint disorders, right shoulder: Secondary | ICD-10-CM | POA: Diagnosis not present

## 2022-03-11 DIAGNOSIS — M24111 Other articular cartilage disorders, right shoulder: Secondary | ICD-10-CM | POA: Diagnosis not present

## 2022-03-11 DIAGNOSIS — G8918 Other acute postprocedural pain: Secondary | ICD-10-CM | POA: Diagnosis not present

## 2022-03-16 DIAGNOSIS — M7541 Impingement syndrome of right shoulder: Secondary | ICD-10-CM | POA: Diagnosis not present

## 2022-03-20 ENCOUNTER — Other Ambulatory Visit: Payer: Self-pay

## 2022-03-20 DIAGNOSIS — D509 Iron deficiency anemia, unspecified: Secondary | ICD-10-CM

## 2022-03-20 DIAGNOSIS — M7541 Impingement syndrome of right shoulder: Secondary | ICD-10-CM | POA: Diagnosis not present

## 2022-03-24 DIAGNOSIS — M7541 Impingement syndrome of right shoulder: Secondary | ICD-10-CM | POA: Diagnosis not present

## 2022-03-25 DIAGNOSIS — M7541 Impingement syndrome of right shoulder: Secondary | ICD-10-CM | POA: Diagnosis not present

## 2022-03-26 DIAGNOSIS — D509 Iron deficiency anemia, unspecified: Secondary | ICD-10-CM | POA: Diagnosis not present

## 2022-03-27 DIAGNOSIS — Z299 Encounter for prophylactic measures, unspecified: Secondary | ICD-10-CM | POA: Diagnosis not present

## 2022-03-27 DIAGNOSIS — F419 Anxiety disorder, unspecified: Secondary | ICD-10-CM | POA: Diagnosis not present

## 2022-03-27 DIAGNOSIS — Z789 Other specified health status: Secondary | ICD-10-CM | POA: Diagnosis not present

## 2022-03-27 DIAGNOSIS — E1159 Type 2 diabetes mellitus with other circulatory complications: Secondary | ICD-10-CM | POA: Diagnosis not present

## 2022-03-27 DIAGNOSIS — I152 Hypertension secondary to endocrine disorders: Secondary | ICD-10-CM | POA: Diagnosis not present

## 2022-03-27 DIAGNOSIS — E7800 Pure hypercholesterolemia, unspecified: Secondary | ICD-10-CM | POA: Diagnosis not present

## 2022-03-27 DIAGNOSIS — Z6836 Body mass index (BMI) 36.0-36.9, adult: Secondary | ICD-10-CM | POA: Diagnosis not present

## 2022-03-27 DIAGNOSIS — Z1331 Encounter for screening for depression: Secondary | ICD-10-CM | POA: Diagnosis not present

## 2022-03-27 DIAGNOSIS — I10 Essential (primary) hypertension: Secondary | ICD-10-CM | POA: Diagnosis not present

## 2022-03-27 DIAGNOSIS — Z79899 Other long term (current) drug therapy: Secondary | ICD-10-CM | POA: Diagnosis not present

## 2022-03-27 DIAGNOSIS — Z0000 Encounter for general adult medical examination without abnormal findings: Secondary | ICD-10-CM | POA: Diagnosis not present

## 2022-03-27 LAB — CBC WITH DIFFERENTIAL/PLATELET
Absolute Monocytes: 416 cells/uL (ref 200–950)
Basophils Absolute: 38 cells/uL (ref 0–200)
Basophils Relative: 0.6 %
Eosinophils Absolute: 397 cells/uL (ref 15–500)
Eosinophils Relative: 6.3 %
HCT: 38.6 % (ref 35.0–45.0)
Hemoglobin: 12.8 g/dL (ref 11.7–15.5)
Lymphs Abs: 2432 cells/uL (ref 850–3900)
MCH: 27.8 pg (ref 27.0–33.0)
MCHC: 33.2 g/dL (ref 32.0–36.0)
MCV: 83.9 fL (ref 80.0–100.0)
MPV: 9.8 fL (ref 7.5–12.5)
Monocytes Relative: 6.6 %
Neutro Abs: 3018 cells/uL (ref 1500–7800)
Neutrophils Relative %: 47.9 %
Platelets: 317 10*3/uL (ref 140–400)
RBC: 4.6 10*6/uL (ref 3.80–5.10)
RDW: 13.6 % (ref 11.0–15.0)
Total Lymphocyte: 38.6 %
WBC: 6.3 10*3/uL (ref 3.8–10.8)

## 2022-03-27 LAB — FERRITIN: Ferritin: 14 ng/mL — ABNORMAL LOW (ref 16–232)

## 2022-03-31 DIAGNOSIS — M7541 Impingement syndrome of right shoulder: Secondary | ICD-10-CM | POA: Diagnosis not present

## 2022-04-01 ENCOUNTER — Other Ambulatory Visit: Payer: Self-pay

## 2022-04-01 DIAGNOSIS — K7581 Nonalcoholic steatohepatitis (NASH): Secondary | ICD-10-CM

## 2022-04-01 DIAGNOSIS — R7989 Other specified abnormal findings of blood chemistry: Secondary | ICD-10-CM

## 2022-04-01 DIAGNOSIS — D509 Iron deficiency anemia, unspecified: Secondary | ICD-10-CM

## 2022-04-03 DIAGNOSIS — M7541 Impingement syndrome of right shoulder: Secondary | ICD-10-CM | POA: Diagnosis not present

## 2022-04-06 DIAGNOSIS — M7541 Impingement syndrome of right shoulder: Secondary | ICD-10-CM | POA: Diagnosis not present

## 2022-04-08 DIAGNOSIS — M7541 Impingement syndrome of right shoulder: Secondary | ICD-10-CM | POA: Diagnosis not present

## 2022-04-13 DIAGNOSIS — M7541 Impingement syndrome of right shoulder: Secondary | ICD-10-CM | POA: Diagnosis not present

## 2022-04-15 DIAGNOSIS — M7541 Impingement syndrome of right shoulder: Secondary | ICD-10-CM | POA: Diagnosis not present

## 2022-04-20 DIAGNOSIS — M7541 Impingement syndrome of right shoulder: Secondary | ICD-10-CM | POA: Diagnosis not present

## 2022-04-22 DIAGNOSIS — M7541 Impingement syndrome of right shoulder: Secondary | ICD-10-CM | POA: Diagnosis not present

## 2022-04-24 DIAGNOSIS — M25511 Pain in right shoulder: Secondary | ICD-10-CM | POA: Diagnosis not present

## 2022-04-28 DIAGNOSIS — M7541 Impingement syndrome of right shoulder: Secondary | ICD-10-CM | POA: Diagnosis not present

## 2022-04-28 DIAGNOSIS — M25511 Pain in right shoulder: Secondary | ICD-10-CM | POA: Diagnosis not present

## 2022-04-29 DIAGNOSIS — M7541 Impingement syndrome of right shoulder: Secondary | ICD-10-CM | POA: Diagnosis not present

## 2022-04-30 DIAGNOSIS — M7541 Impingement syndrome of right shoulder: Secondary | ICD-10-CM | POA: Diagnosis not present

## 2022-05-04 DIAGNOSIS — M7541 Impingement syndrome of right shoulder: Secondary | ICD-10-CM | POA: Diagnosis not present

## 2022-05-06 DIAGNOSIS — M7541 Impingement syndrome of right shoulder: Secondary | ICD-10-CM | POA: Diagnosis not present

## 2022-05-11 ENCOUNTER — Other Ambulatory Visit: Payer: Self-pay

## 2022-05-11 DIAGNOSIS — R7989 Other specified abnormal findings of blood chemistry: Secondary | ICD-10-CM

## 2022-05-11 DIAGNOSIS — D509 Iron deficiency anemia, unspecified: Secondary | ICD-10-CM

## 2022-05-11 DIAGNOSIS — M7541 Impingement syndrome of right shoulder: Secondary | ICD-10-CM | POA: Diagnosis not present

## 2022-05-11 DIAGNOSIS — K7581 Nonalcoholic steatohepatitis (NASH): Secondary | ICD-10-CM

## 2022-05-13 DIAGNOSIS — M7541 Impingement syndrome of right shoulder: Secondary | ICD-10-CM | POA: Diagnosis not present

## 2022-05-14 DIAGNOSIS — D509 Iron deficiency anemia, unspecified: Secondary | ICD-10-CM | POA: Diagnosis not present

## 2022-05-14 DIAGNOSIS — K7581 Nonalcoholic steatohepatitis (NASH): Secondary | ICD-10-CM | POA: Diagnosis not present

## 2022-05-14 DIAGNOSIS — R7989 Other specified abnormal findings of blood chemistry: Secondary | ICD-10-CM | POA: Diagnosis not present

## 2022-05-18 ENCOUNTER — Other Ambulatory Visit: Payer: Self-pay

## 2022-05-18 DIAGNOSIS — M7541 Impingement syndrome of right shoulder: Secondary | ICD-10-CM | POA: Diagnosis not present

## 2022-05-18 DIAGNOSIS — R7989 Other specified abnormal findings of blood chemistry: Secondary | ICD-10-CM

## 2022-05-18 LAB — HEPATIC FUNCTION PANEL
AG Ratio: 1.6 (calc) (ref 1.0–2.5)
ALT: 45 U/L — ABNORMAL HIGH (ref 6–29)
AST: 31 U/L (ref 10–35)
Albumin: 4.3 g/dL (ref 3.6–5.1)
Alkaline phosphatase (APISO): 71 U/L (ref 37–153)
Bilirubin, Direct: 0.1 mg/dL (ref 0.0–0.2)
Globulin: 2.7 g/dL (calc) (ref 1.9–3.7)
Indirect Bilirubin: 0.4 mg/dL (calc) (ref 0.2–1.2)
Total Bilirubin: 0.5 mg/dL (ref 0.2–1.2)
Total Protein: 7 g/dL (ref 6.1–8.1)

## 2022-05-18 LAB — FERRITIN: Ferritin: 16 ng/mL (ref 16–232)

## 2022-05-18 LAB — CBC WITH DIFFERENTIAL/PLATELET
Absolute Monocytes: 324 cells/uL (ref 200–950)
Basophils Absolute: 48 cells/uL (ref 0–200)
Basophils Relative: 0.7 %
Eosinophils Absolute: 345 cells/uL (ref 15–500)
Eosinophils Relative: 5 %
HCT: 41.7 % (ref 35.0–45.0)
Hemoglobin: 13.8 g/dL (ref 11.7–15.5)
Lymphs Abs: 2367 cells/uL (ref 850–3900)
MCH: 27.5 pg (ref 27.0–33.0)
MCHC: 33.1 g/dL (ref 32.0–36.0)
MCV: 83.2 fL (ref 80.0–100.0)
MPV: 10.3 fL (ref 7.5–12.5)
Monocytes Relative: 4.7 %
Neutro Abs: 3816 cells/uL (ref 1500–7800)
Neutrophils Relative %: 55.3 %
Platelets: 271 10*3/uL (ref 140–400)
RBC: 5.01 10*6/uL (ref 3.80–5.10)
RDW: 13.4 % (ref 11.0–15.0)
Total Lymphocyte: 34.3 %
WBC: 6.9 10*3/uL (ref 3.8–10.8)

## 2022-05-18 LAB — TEST AUTHORIZATION

## 2022-05-18 LAB — HEPATITIS C ANTIBODY: Hepatitis C Ab: NONREACTIVE

## 2022-05-20 DIAGNOSIS — M7541 Impingement syndrome of right shoulder: Secondary | ICD-10-CM | POA: Diagnosis not present

## 2022-05-25 DIAGNOSIS — M7541 Impingement syndrome of right shoulder: Secondary | ICD-10-CM | POA: Diagnosis not present

## 2022-05-27 DIAGNOSIS — M7541 Impingement syndrome of right shoulder: Secondary | ICD-10-CM | POA: Diagnosis not present

## 2022-05-29 DIAGNOSIS — S46011D Strain of muscle(s) and tendon(s) of the rotator cuff of right shoulder, subsequent encounter: Secondary | ICD-10-CM | POA: Diagnosis not present

## 2022-05-29 DIAGNOSIS — M25511 Pain in right shoulder: Secondary | ICD-10-CM | POA: Diagnosis not present

## 2022-06-05 DIAGNOSIS — M7541 Impingement syndrome of right shoulder: Secondary | ICD-10-CM | POA: Diagnosis not present

## 2022-06-12 DIAGNOSIS — M7541 Impingement syndrome of right shoulder: Secondary | ICD-10-CM | POA: Diagnosis not present

## 2022-06-19 DIAGNOSIS — M7541 Impingement syndrome of right shoulder: Secondary | ICD-10-CM | POA: Diagnosis not present

## 2022-06-24 DIAGNOSIS — M7541 Impingement syndrome of right shoulder: Secondary | ICD-10-CM | POA: Diagnosis not present

## 2022-07-02 DIAGNOSIS — M7541 Impingement syndrome of right shoulder: Secondary | ICD-10-CM | POA: Diagnosis not present

## 2022-07-09 DIAGNOSIS — M7541 Impingement syndrome of right shoulder: Secondary | ICD-10-CM | POA: Diagnosis not present

## 2022-07-15 DIAGNOSIS — M7541 Impingement syndrome of right shoulder: Secondary | ICD-10-CM | POA: Diagnosis not present

## 2022-07-21 NOTE — Progress Notes (Signed)
GI Office Note    Referring Provider: No ref. provider found Primary Care Physician:  No primary care provider on file.  Primary Gastroenterologist: Roetta Sessions, MD   Chief Complaint   Chief Complaint  Patient presents with   Follow-up    Doing well    History of Present Illness   Loretta Schaefer is a 52 y.o. female presenting today for follow-up.  Last seen in October 2023.  History of GERD, NASH (biopsy in 2000) undergoing yearly ultrasound and monitoring of LFTs every six months.  Mother and aunt both had NASH cirrhosis.  Prior ultrasound with elastography showing F4.  EGD August 2015 with no evidence of portal hypertension. Labs last year also showing low iron stores (ferritin) with no anemia.   Today: Doing weight watchers with her daughter, started three weeks ago, down about 4 pounds. Started using a walking pad. Did not tolerate oral diet so increased dietary iron about 3 months ago. BM regular. No melena, brbpr. No heartburn on PPI. No n/v, dysphagia. No abdominal pain. Had shoulder surgery 02/2022, finished up PT. Still with some mobility restrictions. Rarely uses NSAIDs (ibuprofen).   Previous work up:   Colonoscopy up-to-date June 2022 with 6 mm polyp at the ileocecal valve removed.  Pathology with tubular adenoma.  Recommended 5-year surveillance.   EGD 10/2013: small hiatal hernia, no evidence of portal hypertension   2016: Liver fibrosis/fibrotest F0 2016 [place] with elastography, Metavir score F3/F4 2015 [place] with elastography, Metavir score F4  Right upper quadrant ultrasound November 2023: Steatosis, no cholelithiasis.  Labs from October 2023: LFTs normal, immunoglobulins unremarkable, ANA negative, AMA negative, ASMA negative, TTG IgA normal, CBC normal, ferritin 9, iron sat 17%, TIBC 418, iron 73.  iFOBT negative.  Recommended ferrous sulfate 325 mg daily.  Follow-up labs in December with ferritin of 14, hemoglobin 12.8.  Labs from February 2024: Hemoglobin  13.8, platelets 271,000, total bilirubin 0.5, alkaline phosphatase 71, AST 31, ALT 45, ferritin 16, hepatitis C antibody negative,  Completed hepatitis A and B vaccinations remotely.    Medications   Current Outpatient Medications  Medication Sig Dispense Refill   buPROPion (WELLBUTRIN XL) 150 MG 24 hr tablet Take 150 mg by mouth daily with lunch.     busPIRone (BUSPAR) 5 MG tablet Take 5 mg by mouth 2 (two) times daily.     ibuprofen (ADVIL,MOTRIN) 200 MG tablet Take 400 mg by mouth every 6 (six) hours as needed for headache or moderate pain.     loratadine (CLARITIN) 10 MG tablet Take 10 mg by mouth daily.     metFORMIN (GLUCOPHAGE) 500 MG tablet Take 500 mg by mouth daily.     metoprolol succinate (TOPROL-XL) 25 MG 24 hr tablet Take 25 mg by mouth every evening.     Omega-3 Fatty Acids (FISH OIL) 1000 MG CAPS Take 1,000 mg by mouth daily.     pantoprazole (PROTONIX) 40 MG tablet Take 1 tablet (40 mg total) by mouth daily before breakfast. 90 tablet 3   rosuvastatin (CRESTOR) 10 MG tablet Take 10 mg by mouth every evening.     Vitamin D, Ergocalciferol, (DRISDOL) 1.25 MG (50000 UNIT) CAPS capsule Take 50,000 Units by mouth every Saturday.     vitamin E 400 UNIT capsule Take 400 Units by mouth daily.     No current facility-administered medications for this visit.    Allergies   Allergies as of 07/22/2022 - Review Complete 07/22/2022  Allergen Reaction Noted  Omnipaque [iohexol] Hives and Itching 06/02/2010        Review of Systems   General: Negative for anorexia, weight loss, fever, chills, fatigue, weakness. ENT: Negative for hoarseness, difficulty swallowing , nasal congestion. CV: Negative for chest pain, angina, palpitations, dyspnea on exertion, peripheral edema.  Respiratory: Negative for dyspnea at rest, dyspnea on exertion, cough, sputum, wheezing.  GI: See history of present illness. GU:  Negative for dysuria, hematuria, urinary incontinence, urinary frequency,  nocturnal urination.  Endo: Negative for unusual weight change.     Physical Exam   BP 126/82 (BP Location: Right Arm, Patient Position: Sitting, Cuff Size: Large)   Pulse 69   Temp 97.6 F (36.4 C) (Oral)   Ht 5\' 4"  (1.626 m)   Wt 189 lb 9.6 oz (86 kg)   SpO2 100%   BMI 32.54 kg/m    General: Well-nourished, well-developed in no acute distress.  Eyes: No icterus. Mouth: Oropharyngeal mucosa moist and pink   Abdomen: Bowel sounds are normal, nontender, nondistended, no hepatosplenomegaly or masses,  no abdominal bruits or hernia , no rebound or guarding.  Rectal: not performed Extremities: No lower extremity edema. No clubbing or deformities. Neuro: Alert and oriented x 4   Skin: Warm and dry, no jaundice.   Psych: Alert and cooperative, normal mood and affect.  Labs   No results found for: "HGBA1C" Lab Results  Component Value Date   CREATININE 0.90 12/03/2020   BUN 7 12/03/2020   NA 138 12/03/2020   K 3.7 12/03/2020   CL 106 12/03/2020   CO2 25 12/03/2020   Lab Results  Component Value Date   ALT 45 (H) 05/14/2022   AST 31 05/14/2022   GGT 15 02/15/2015   ALKPHOS 48 10/02/2016   BILITOT 0.5 05/14/2022   Lab Results  Component Value Date   WBC 6.9 05/14/2022   HGB 13.8 05/14/2022   HCT 41.7 05/14/2022   MCV 83.2 05/14/2022   PLT 271 05/14/2022   Lab Results  Component Value Date   IRON 73 01/23/2022   TIBC 418 01/23/2022   FERRITIN 16 05/14/2022     Imaging Studies   No results found.  Assessment   NASH: biopsy proven 2000. Strong family history of NASH cirrhosis. Previous F4 on elastography but ?falsely elevated. F0 on Fibrosure. She has been getting yearly u/s for hepatoma surveillance given F4 scores. No overt signs of cirrhosis.   GERD: well controlled  IDA: routine labs last fall showed ferritin of 9. Started on iron. Ferritin in 04/2022 was 16. Colonoscopy in 2022. Last EGD 2016. No overt GI bleeding.   PLAN   RUQ U/S in 12/2022 Labs  in six months, CBC, PT/INR, CMET, ferritin Return ov in six months after labs and ultrasound. Continue to strive for weight loss, exercise, glycemic and cholesterol control. Ifobt. If positive, recommend EGD. If negative, we will likely recheck iron labs in 3 months to ensure remain normal.    Leanna Battles. Melvyn Neth, MHS, PA-C [facility]

## 2022-07-22 ENCOUNTER — Ambulatory Visit: Payer: BC Managed Care – PPO | Admitting: Gastroenterology

## 2022-07-22 ENCOUNTER — Encounter: Payer: Self-pay | Admitting: Gastroenterology

## 2022-07-22 VITALS — BP 126/82 | HR 69 | Temp 97.6°F | Ht 64.0 in | Wt 189.6 lb

## 2022-07-22 DIAGNOSIS — M7541 Impingement syndrome of right shoulder: Secondary | ICD-10-CM | POA: Diagnosis not present

## 2022-07-22 DIAGNOSIS — K219 Gastro-esophageal reflux disease without esophagitis: Secondary | ICD-10-CM | POA: Diagnosis not present

## 2022-07-22 DIAGNOSIS — K7581 Nonalcoholic steatohepatitis (NASH): Secondary | ICD-10-CM

## 2022-07-22 DIAGNOSIS — D509 Iron deficiency anemia, unspecified: Secondary | ICD-10-CM | POA: Diagnosis not present

## 2022-07-22 NOTE — Patient Instructions (Addendum)
We will update your ultrasound of the liver around 12/2022 (one year follow up). We will update your liver and iron labs in six months.  Complete stool sample and return to our office.  Return office visit in six months after your labs and ultrasound.   Instructions for fatty liver: Recommend 1-2# weight loss per week until ideal body weight through exercise & diet. Low fat/cholesterol diet.   Avoid sweets, sodas, fruit juices, sweetened beverages like tea, etc. Gradually increase exercise from 15 min daily up to 1 hr per day 5 days/week. Limit alcohol use.

## 2022-07-23 ENCOUNTER — Other Ambulatory Visit: Payer: Self-pay

## 2022-07-23 ENCOUNTER — Ambulatory Visit (INDEPENDENT_AMBULATORY_CARE_PROVIDER_SITE_OTHER): Payer: BC Managed Care – PPO | Admitting: Gastroenterology

## 2022-07-23 DIAGNOSIS — R7989 Other specified abnormal findings of blood chemistry: Secondary | ICD-10-CM

## 2022-07-23 DIAGNOSIS — K6289 Other specified diseases of anus and rectum: Secondary | ICD-10-CM

## 2022-07-23 DIAGNOSIS — F332 Major depressive disorder, recurrent severe without psychotic features: Secondary | ICD-10-CM | POA: Diagnosis not present

## 2022-07-23 DIAGNOSIS — I10 Essential (primary) hypertension: Secondary | ICD-10-CM | POA: Diagnosis not present

## 2022-07-23 DIAGNOSIS — Z299 Encounter for prophylactic measures, unspecified: Secondary | ICD-10-CM | POA: Diagnosis not present

## 2022-07-23 DIAGNOSIS — E119 Type 2 diabetes mellitus without complications: Secondary | ICD-10-CM | POA: Diagnosis not present

## 2022-07-23 LAB — IFOBT (OCCULT BLOOD): IFOBT: NEGATIVE

## 2022-07-24 ENCOUNTER — Ambulatory Visit: Payer: BC Managed Care – PPO | Admitting: Gastroenterology

## 2022-07-27 ENCOUNTER — Other Ambulatory Visit: Payer: Self-pay

## 2022-07-27 DIAGNOSIS — D509 Iron deficiency anemia, unspecified: Secondary | ICD-10-CM

## 2022-08-20 ENCOUNTER — Other Ambulatory Visit (HOSPITAL_COMMUNITY): Payer: Self-pay | Admitting: Adult Health

## 2022-08-20 DIAGNOSIS — Z1231 Encounter for screening mammogram for malignant neoplasm of breast: Secondary | ICD-10-CM

## 2022-09-11 ENCOUNTER — Ambulatory Visit (HOSPITAL_COMMUNITY)
Admission: RE | Admit: 2022-09-11 | Discharge: 2022-09-11 | Disposition: A | Discharge status: Home / Self Care | Payer: BC Managed Care – PPO | Source: Ambulatory Visit | Attending: Adult Health | Admitting: Adult Health

## 2022-09-11 DIAGNOSIS — Z1231 Encounter for screening mammogram for malignant neoplasm of breast: Secondary | ICD-10-CM

## 2022-10-02 ENCOUNTER — Other Ambulatory Visit: Payer: Self-pay

## 2022-10-02 DIAGNOSIS — D509 Iron deficiency anemia, unspecified: Secondary | ICD-10-CM

## 2022-10-08 ENCOUNTER — Encounter: Payer: Self-pay | Admitting: Adult Health

## 2022-10-08 ENCOUNTER — Ambulatory Visit (INDEPENDENT_AMBULATORY_CARE_PROVIDER_SITE_OTHER): Payer: BC Managed Care – PPO | Admitting: Adult Health

## 2022-10-08 VITALS — BP 139/82 | HR 65 | Ht 60.0 in | Wt 180.0 lb

## 2022-10-08 DIAGNOSIS — Z90710 Acquired absence of both cervix and uterus: Secondary | ICD-10-CM | POA: Diagnosis not present

## 2022-10-08 DIAGNOSIS — Z1211 Encounter for screening for malignant neoplasm of colon: Secondary | ICD-10-CM | POA: Diagnosis not present

## 2022-10-08 DIAGNOSIS — Z01419 Encounter for gynecological examination (general) (routine) without abnormal findings: Secondary | ICD-10-CM

## 2022-10-08 LAB — HEMOCCULT GUIAC POC 1CARD (OFFICE): Fecal Occult Blood, POC: NEGATIVE

## 2022-10-08 NOTE — Progress Notes (Signed)
Patient ID: Loretta Schaefer, female   DOB: 1970/05/11, 52 y.o.   MRN: [id number] History of Present Illness: Loretta Schaefer is a 51 year old white female, married, sp hysterectomy in for well woman gyn exam. She has occasional hot flash and decreased sex drive and hips are stiff if sits too long.  PCP is Dr Sherril Croon.   Current Medications, Allergies, Past Medical History, Past Surgical History, Family History and Social History were reviewed in [employer] record.     Review of Systems: Patient denies any headaches, hearing loss, fatigue, blurred vision, shortness of breath, chest pain, abdominal pain, problems with bowel movements, urination, or intercourse. No joint pain or mood swings.  See HPI for positives   Physical Exam:BP 139/82 (BP Location: Left Arm, Patient Position: Sitting, Cuff Size: Normal)   Pulse 65   Ht 5' (1.524 m)   Wt 180 lb (81.6 kg)   BMI 35.15 kg/m   General:  Well developed, well nourished, no acute distress Skin:  Warm and dry Neck:  Midline trachea, normal thyroid, good ROM, no lymphadenopathy Lungs; Clear to auscultation bilaterally Breast:  No dominant palpable mass, retraction, or nipple discharge Cardiovascular: Regular rate and rhythm Abdomen:  Soft, non tender, no hepatosplenomegaly Pelvic:  External genitalia is normal in appearance, no lesions.  The vagina is pale. Urethra has no lesions or masses. The cervix and uterus are absent.  No adnexal masses or tenderness noted.Bladder is non tender, no masses felt. Rectal: Good sphincter tone, no polyps, or hemorrhoids felt.  Hemoccult negative. Extremities/musculoskeletal:  No swelling or varicosities noted, no clubbing or cyanosis Psych:  No mood changes, alert and cooperative,seems happy AA is 0 Fall risk is low    10/08/2022    8:32 AM 07/21/2021    9:58 AM 11/03/2019   11:42 AM  Depression screen PHQ 2/9  Decreased Interest 1 1 0  Down, Depressed, Hopeless 1 1 1   PHQ - 2 Score 2 2 1    Altered sleeping 1 1 2   Tired, decreased energy 3 3 2   Change in appetite 1 3 0  Feeling bad or failure about yourself  1 1 1   Trouble concentrating 1 2 1   Moving slowly or fidgety/restless 0 0 0  Suicidal thoughts 0 0 0  PHQ-9 Score 9 12 7   Difficult doing work/chores  Very difficult Not difficult at all   She is on meds but does not take Wellbutrin daily, used to have counseling, will consider again    10/08/2022    8:33 AM 11/03/2019   11:43 AM  GAD 7 : Generalized Anxiety Score  Nervous, Anxious, on Edge 1 2  Control/stop worrying 2 1  Worry too much - different things 3 2  Trouble relaxing 3 2  Restless 1 1  Easily annoyed or irritable 1 2  Afraid - awful might happen 0 0  Total GAD 7 Score 11 10  Anxiety Difficulty  Not difficult at all      Upstream - 10/08/22 0840       Pregnancy Intention Screening   Does the patient want to become pregnant in the next year? N/A    Does the patient's partner want to become pregnant in the next year? N/A    Would the patient like to discuss contraceptive options today? N/A      Contraception Wrap Up   Current Method Female Sterilization   hyst   End Method Female Sterilization   hyst   Contraception Counseling Provided  No             Examination chaperoned by Malachy Mood LPN  Impression and Plan: 1. Well woman exam with routine gynecological exam Physical in 1 year Labs with PCP Mammogram was negative 09/11/22 Colonoscopy per GI  Try  to increase frequency of sex and use good lubricate on self and partner Stay active  2. S/P hysterectomy  3. Encounter for screening fecal occult blood testing Hemoccult was negative  - POCT occult blood stool

## 2022-10-16 ENCOUNTER — Other Ambulatory Visit (HOSPITAL_COMMUNITY)
Admission: RE | Admit: 2022-10-16 | Discharge: 2022-10-16 | Disposition: A | Discharge status: Home / Self Care | Payer: BC Managed Care – PPO | Source: Ambulatory Visit | Attending: Gastroenterology | Admitting: Gastroenterology

## 2022-10-16 DIAGNOSIS — D509 Iron deficiency anemia, unspecified: Secondary | ICD-10-CM | POA: Insufficient documentation

## 2022-10-16 LAB — CBC WITH DIFFERENTIAL/PLATELET
Abs Immature Granulocytes: 0.01 10*3/uL (ref 0.00–0.07)
Basophils Absolute: 0.1 10*3/uL (ref 0.0–0.1)
Basophils Relative: 1 %
Eosinophils Absolute: 0.4 10*3/uL (ref 0.0–0.5)
Eosinophils Relative: 7 %
HCT: 39.3 % (ref 36.0–46.0)
Hemoglobin: 12.8 g/dL (ref 12.0–15.0)
Immature Granulocytes: 0 %
Lymphocytes Relative: 37 %
Lymphs Abs: 2.1 10*3/uL (ref 0.7–4.0)
MCH: 26.9 pg (ref 26.0–34.0)
MCHC: 32.6 g/dL (ref 30.0–36.0)
MCV: 82.6 fL (ref 80.0–100.0)
Monocytes Absolute: 0.9 10*3/uL (ref 0.1–1.0)
Monocytes Relative: 16 %
Neutro Abs: 2.2 10*3/uL (ref 1.7–7.7)
Neutrophils Relative %: 39 %
Platelets: 228 10*3/uL (ref 150–400)
RBC: 4.76 MIL/uL (ref 3.87–5.11)
RDW: 13.8 % (ref 11.5–15.5)
WBC: 5.7 10*3/uL (ref 4.0–10.5)
nRBC: 0 % (ref 0.0–0.2)

## 2022-10-16 LAB — FERRITIN: Ferritin: 17 ng/mL (ref 11–307)

## 2022-10-16 LAB — IRON AND TIBC
Iron: 59 ug/dL (ref 28–170)
Saturation Ratios: 13 % (ref 10.4–31.8)
TIBC: 459 ug/dL — ABNORMAL HIGH (ref 250–450)
UIBC: 400 ug/dL

## 2022-10-23 ENCOUNTER — Telehealth: Payer: Self-pay

## 2022-10-23 DIAGNOSIS — I10 Essential (primary) hypertension: Secondary | ICD-10-CM | POA: Diagnosis not present

## 2022-10-23 DIAGNOSIS — E1165 Type 2 diabetes mellitus with hyperglycemia: Secondary | ICD-10-CM | POA: Diagnosis not present

## 2022-10-23 DIAGNOSIS — Z299 Encounter for prophylactic measures, unspecified: Secondary | ICD-10-CM | POA: Diagnosis not present

## 2022-10-23 DIAGNOSIS — F332 Major depressive disorder, recurrent severe without psychotic features: Secondary | ICD-10-CM | POA: Diagnosis not present

## 2022-10-23 DIAGNOSIS — R0683 Snoring: Secondary | ICD-10-CM | POA: Diagnosis not present

## 2022-10-23 NOTE — Telephone Encounter (Signed)
Pt called wanting to know results to recent lab work.

## 2022-10-24 NOTE — Telephone Encounter (Signed)
See result note.  

## 2022-11-17 ENCOUNTER — Other Ambulatory Visit: Payer: Self-pay | Admitting: Gastroenterology

## 2022-11-20 ENCOUNTER — Ambulatory Visit: Payer: BC Managed Care – PPO | Admitting: Gastroenterology

## 2022-11-20 ENCOUNTER — Encounter: Payer: Self-pay | Admitting: Gastroenterology

## 2022-11-20 VITALS — BP 138/85 | HR 59 | Temp 98.0°F | Ht 60.0 in | Wt 180.2 lb

## 2022-11-20 DIAGNOSIS — K219 Gastro-esophageal reflux disease without esophagitis: Secondary | ICD-10-CM

## 2022-11-20 DIAGNOSIS — K7581 Nonalcoholic steatohepatitis (NASH): Secondary | ICD-10-CM | POA: Diagnosis not present

## 2022-11-20 DIAGNOSIS — D509 Iron deficiency anemia, unspecified: Secondary | ICD-10-CM | POA: Diagnosis not present

## 2022-11-20 NOTE — Patient Instructions (Signed)
We will send you lab orders closer to 12/2022. Plan for ultrasound with elastography in 12/2022.  Continue pantoprazole 40mg  daily before breakfast.  Look into WalkFit APP for way to complete short interval exercises at home.  I have provided you with patient information for Rezdiffra today.  Return office visit in one year unless we start Rezdiffra in the interim at which point we will see you sooner.

## 2022-11-20 NOTE — Progress Notes (Unsigned)
GI Office Note    Referring Provider: Ignatius Specking, MD Primary Care Physician:  Ignatius Specking, MD  Primary Gastroenterologist:Michael Jena Gauss, MD   Chief Complaint   Chief Complaint  Patient presents with   Follow-up    Discuss NASH    History of Present Illness   Loretta Schaefer is a 52 y.o. female presenting today follow-up. Last seen in October 2023.  History of GERD, NASH (biopsy in 2000) undergoing yearly ultrasound and monitoring of LFTs every six months.  Mother and aunt both had NASH cirrhosis.  Prior ultrasound with elastography showing F4 back in 2016 but at that time her fibrosis labs indicate F0.  EGD August 2015 with no evidence of portal hypertension. Labs last year also showing low iron stores (ferritin) with no anemia.  She took oral iron, ferritin improved to 16 6 months ago, at which time she stopped oral iron.    Last labs done in 09/2022. Ferritin 17, CBC normal. TIBC 459H. Stool heme negative.   She has been doing weight watchers.  Really trying to watch her diet.  Her A1c had gotten up to 7.5 but with last check was back down to 6.9.  She is lost about 9 pounds since April.  She has been on metformin for 2 years.  Previously did not tolerate Rybelsus.  Has never been on GLP-1 drug.  Has a lot of stress.  Finds it hard to exercise, by the end of her day she is tired.  Helps take care of her grandkids.  She denies any abdominal pain.  Appetite is normal.  Heartburn is controlled.  No dysphagia.  No constipation or diarrhea.  No melena or rectal bleeding.     Previous work up:   Colonoscopy up-to-date June 2022 with 6 mm polyp at the ileocecal valve removed.  Pathology with tubular adenoma.  Recommended 5-year surveillance.   EGD 10/2013: small hiatal hernia, no evidence of portal hypertension   2016: Liver fibrosis/fibrotest F0 2016 [place] with elastography, Metavir score F3/F4 2015 [place] with elastography, Metavir score F4   Right upper quadrant ultrasound  November 2023: Steatosis, no cholelithiasis.   Labs from October 2023: LFTs normal, immunoglobulins unremarkable, ANA negative, AMA negative, ASMA negative, TTG IgA normal, CBC normal, ferritin 9, iron sat 17%, TIBC 418, iron 73.  iFOBT negative.  Recommended ferrous sulfate 325 mg daily.  Follow-up labs in December with ferritin of 14, hemoglobin 12.8.   Labs from February 2024: Hemoglobin 13.8, platelets 271,000, total bilirubin 0.5, alkaline phosphatase 71, AST 31, ALT 45, ferritin 16, hepatitis C antibody negative,   Completed hepatitis A and B vaccinations remotely.  Liver bx: NASH, 2000       Wt Readings from Last 10 Encounters:  11/20/22 180 lb 3.2 oz (81.7 kg)  10/08/22 180 lb (81.6 kg)  07/22/22 189 lb 9.6 oz (86 kg)  01/23/22 186 lb 12.8 oz (84.7 kg)  10/16/21 184 lb (83.5 kg)  08/28/21 186 lb (84.4 kg)  07/21/21 191 lb (86.6 kg)  07/07/21 191 lb 6.4 oz (86.8 kg)  12/03/20 185 lb 12.8 oz (84.3 kg)  11/22/20 185 lb 12.8 oz (84.3 kg)     Medications   Current Outpatient Medications  Medication Sig Dispense Refill   buPROPion (WELLBUTRIN XL) 150 MG 24 hr tablet Take 150 mg by mouth daily with lunch.     busPIRone (BUSPAR) 5 MG tablet Take 5 mg by mouth.     ibuprofen (ADVIL,MOTRIN) 200 MG  tablet Take 400 mg by mouth every 6 (six) hours as needed for headache or moderate pain.     loratadine (CLARITIN) 10 MG tablet Take 10 mg by mouth daily.     metFORMIN (GLUCOPHAGE) 500 MG tablet Take 500 mg by mouth 2 (two) times daily with a meal.     metoprolol succinate (TOPROL-XL) 25 MG 24 hr tablet Take 25 mg by mouth every evening.     Omega-3 Fatty Acids (FISH OIL) 1000 MG CAPS Take 1,000 mg by mouth daily.     pantoprazole (PROTONIX) 40 MG tablet TAKE ONE TABLET BY MOUTH EVERY DAY 30 tablet 11   rosuvastatin (CRESTOR) 10 MG tablet Take 10 mg by mouth every evening.     Vitamin D, Ergocalciferol, (DRISDOL) 1.25 MG (50000 UNIT) CAPS capsule Take 50,000 Units by mouth every  Saturday.     vitamin E 400 UNIT capsule Take 400 Units by mouth daily.     No current facility-administered medications for this visit.    Allergies   Allergies as of 11/20/2022 - Review Complete 11/20/2022  Allergen Reaction Noted   Omnipaque [iohexol] Hives and Itching 06/02/2010    Review of Systems   General: Negative for anorexia, unintentional weight loss, fever, chills, fatigue, weakness. ENT: Negative for hoarseness, difficulty swallowing , nasal congestion. CV: Negative for chest pain, angina, palpitations, dyspnea on exertion, peripheral edema.  Respiratory: Negative for dyspnea at rest, dyspnea on exertion, cough, sputum, wheezing.  GI: See history of present illness. GU:  Negative for dysuria, hematuria, urinary incontinence, urinary frequency, nocturnal urination.  Endo: Negative for unusual weight change.     Physical Exam   BP 138/85 (BP Location: Right Arm, Patient Position: Sitting, Cuff Size: Normal)   Pulse (!) 59   Temp 98 F (36.7 C) (Oral)   Ht 5' (1.524 m)   Wt 180 lb 3.2 oz (81.7 kg)   SpO2 98%   BMI 35.19 kg/m    General: Well-nourished, well-developed in no acute distress.  Eyes: No icterus. Mouth: Oropharyngeal mucosa moist and pink  Abdomen: Bowel sounds are normal, nontender, nondistended, no hepatosplenomegaly or masses, no abdominal bruits or hernia , no rebound or guarding.  Rectal: not performed Extremities: No lower extremity edema. No clubbing or deformities. Neuro: Alert and oriented x 4   Skin: Warm and dry, no jaundice.   Psych: Alert and cooperative, normal mood and affect.  Labs     Lab Results  Component Value Date   ALT 45 (H) 05/14/2022   AST 31 05/14/2022   GGT 15 02/15/2015   ALKPHOS 71 05/14/2022   BILITOT 0.5 05/14/2022   Lab Results  Component Value Date   WBC 5.7 10/16/2022   HGB 12.8 10/16/2022   HCT 39.3 10/16/2022   MCV 82.6 10/16/2022   PLT 228 10/16/2022   Lab Results  Component Value Date   IRON  59 10/16/2022   TIBC 459 (H) 10/16/2022   FERRITIN 17 10/16/2022    Imaging Studies   No results found.  Assessment   *NASH *GERD *IDA   Reflux well controlled on PPI. Reinforced antireflux measures.  Mild iron deficiency last year with no anemia. Corrected with oral iron although ferritin low normal with mild elevated of TIBC. Stool hemoccult negative. Colonoscopy 2022. Will continue to monitor off oral iron. If decline in ferritin or Hgb would advise EGD.  We have been monitoring her biopsy proven NASH.  In 2016 her elastography showed F4 but her fibrosis labs F0. She  is working to control her weight and A1C. Her FH is significant for NASH cirrhosis (mother, maternal aunt). We discussed new medication, Rezdiffra today, indicated for NASH F3 patients. I would recommend she complete labs and updated elastography to restage her degree of fibrosis.   PLAN   CBC, CMET, PT/INR, iron/tibc/ferritin, NASH fibrosis lab RUQ U/S with elastography Continue pantoprazole 40mg  daily before breakfast. Consider WalkFit app or similar to assist with exercise regimen at home.  Continue to strive for weight loss towards ideal body weight and tight glycemic control. Return office visit in one year.    Leanna Battles. Melvyn Neth, MHS, PA-C [facility]

## 2022-12-16 ENCOUNTER — Other Ambulatory Visit: Payer: Self-pay | Admitting: Gastroenterology

## 2023-01-22 ENCOUNTER — Telehealth: Payer: Self-pay | Admitting: Gastroenterology

## 2023-01-22 ENCOUNTER — Telehealth: Payer: Self-pay

## 2023-01-22 ENCOUNTER — Other Ambulatory Visit: Payer: Self-pay | Admitting: *Deleted

## 2023-01-22 DIAGNOSIS — K7581 Nonalcoholic steatohepatitis (NASH): Secondary | ICD-10-CM

## 2023-01-22 DIAGNOSIS — R7989 Other specified abnormal findings of blood chemistry: Secondary | ICD-10-CM

## 2023-01-22 NOTE — Telephone Encounter (Signed)
Pt was made aware and verbalized understanding.  

## 2023-01-22 NOTE — Telephone Encounter (Signed)
See separate telephone note.

## 2023-01-22 NOTE — Telephone Encounter (Signed)
Pt informed us set up for 01/29/23, arrive at 9:15 am to check in, NPO after midnight.  Verbalized understanding.

## 2023-01-22 NOTE — Telephone Encounter (Signed)
Pt called wanting to get lab orders sent over to [facility] and also to scheduled [place] that is due this month.

## 2023-01-22 NOTE — Telephone Encounter (Signed)
Labs at [facility] or [facility] lab.  CBC, CMET, PT/INR, iron/tibc/ferritin, NASH fibrosis Orders entered

## 2023-01-28 LAB — COMPREHENSIVE METABOLIC PANEL
AG Ratio: 1.4 (calc) (ref 1.0–2.5)
ALT: 22 U/L (ref 6–29)
AST: 24 U/L (ref 10–35)
Albumin: 4.1 g/dL (ref 3.6–5.1)
Alkaline phosphatase (APISO): 67 U/L (ref 37–153)
BUN: 8 mg/dL (ref 7–25)
CO2: 27 mmol/L (ref 20–32)
Calcium: 9.6 mg/dL (ref 8.6–10.4)
Chloride: 105 mmol/L (ref 98–110)
Creat: 0.87 mg/dL (ref 0.50–1.03)
Globulin: 2.9 g/dL (ref 1.9–3.7)
Glucose, Bld: 119 mg/dL — ABNORMAL HIGH (ref 65–99)
Potassium: 4.5 mmol/L (ref 3.5–5.3)
Sodium: 139 mmol/L (ref 135–146)
Total Bilirubin: 0.3 mg/dL (ref 0.2–1.2)
Total Protein: 7 g/dL (ref 6.1–8.1)

## 2023-01-28 LAB — CBC WITH DIFFERENTIAL/PLATELET
Absolute Lymphocytes: 2703 {cells}/uL (ref 850–3900)
Absolute Monocytes: 403 {cells}/uL (ref 200–950)
Basophils Absolute: 62 {cells}/uL (ref 0–200)
Basophils Relative: 1 %
Eosinophils Absolute: 484 {cells}/uL (ref 15–500)
Eosinophils Relative: 7.8 %
HCT: 41.7 % (ref 35.0–45.0)
Hemoglobin: 13.3 g/dL (ref 11.7–15.5)
MCH: 26.7 pg — ABNORMAL LOW (ref 27.0–33.0)
MCHC: 31.9 g/dL — ABNORMAL LOW (ref 32.0–36.0)
MCV: 83.6 fL (ref 80.0–100.0)
MPV: 10.7 fL (ref 7.5–12.5)
Monocytes Relative: 6.5 %
Neutro Abs: 2548 {cells}/uL (ref 1500–7800)
Neutrophils Relative %: 41.1 %
Platelets: 269 10*3/uL (ref 140–400)
RBC: 4.99 10*6/uL (ref 3.80–5.10)
RDW: 14.1 % (ref 11.0–15.0)
Total Lymphocyte: 43.6 %
WBC: 6.2 10*3/uL (ref 3.8–10.8)

## 2023-01-28 LAB — LIVER FIBROSIS, FIBROTEST-ACTITEST
ALT: 24 U/L (ref 6–29)
Alpha-2-Macroglobulin: 206 mg/dL (ref 106–279)
Apolipoprotein A1: 193 mg/dL (ref 101–198)
Bilirubin: 0.4 mg/dL (ref 0.2–1.2)
Fibrosis Score: 0.09
GGT: 24 U/L (ref 3–70)
Haptoglobin: 158 mg/dL (ref 43–212)
Necroinflammat ACT Score: 0.08
Reference ID: 5179037

## 2023-01-28 LAB — IRON,TIBC AND FERRITIN PANEL
%SAT: 16 % (ref 16–45)
Ferritin: 9 ng/mL — ABNORMAL LOW (ref 16–232)
Iron: 71 ug/dL (ref 45–160)
TIBC: 433 ug/dL (ref 250–450)

## 2023-01-28 LAB — PROTIME-INR
INR: 1
Prothrombin Time: 10.6 s (ref 9.0–11.5)

## 2023-01-29 ENCOUNTER — Ambulatory Visit (HOSPITAL_COMMUNITY)
Admission: RE | Admit: 2023-01-29 | Discharge: 2023-01-29 | Disposition: A | Discharge status: Home / Self Care | Payer: BC Managed Care – PPO | Source: Ambulatory Visit | Attending: Gastroenterology | Admitting: Gastroenterology

## 2023-01-29 DIAGNOSIS — K7581 Nonalcoholic steatohepatitis (NASH): Secondary | ICD-10-CM | POA: Diagnosis present

## 2023-02-03 ENCOUNTER — Encounter: Payer: Self-pay | Admitting: *Deleted

## 2023-02-03 NOTE — Telephone Encounter (Signed)
Please schedule EGD with Dr. Jena Gauss for recurrent iron deficiency, GERD. ASA 3. Day before take metformin AM dose only.

## 2023-02-05 ENCOUNTER — Telehealth: Payer: Self-pay

## 2023-02-05 NOTE — Telephone Encounter (Signed)
Thank you Tammy. I have sent patient message regarding results.

## 2023-02-05 NOTE — Telephone Encounter (Signed)
Pt called regarding recent US. Explained to pt that it has not been resulted yet. Contacted reading room and they are sending it over to be read.

## 2023-02-08 NOTE — Telephone Encounter (Signed)
Noted  

## 2023-03-03 ENCOUNTER — Other Ambulatory Visit (HOSPITAL_COMMUNITY): Payer: BC Managed Care – PPO

## 2023-03-03 NOTE — Patient Instructions (Signed)
Loretta Schaefer  03/03/2023     @[email or web address] @   Your procedure is scheduled on  03/05/2023.   Report to [facility] at  1200  P.M.   Call this number if you have problems the morning of surgery:  [phone]  If you experience any cold or flu symptoms such as cough, fever, chills, shortness of breath, etc. between now and your scheduled surgery, please notify us at the above number.   Remember:  Follow the diet instructions given to you by the office.   You may drink clear liquids until 1000 am on 03/05/2023.     Clear liquids allowed are:                    Water, Juice (No red color; non-citric and without pulp; diabetics please choose diet or no sugar options), Carbonated beverages (diabetics please choose diet or no sugar options), Clear Tea (No creamer, milk, or cream, including half & half and powdered creamer), Black Coffee Only (No creamer, milk or cream, including half & half and powdered creamer), and Clear Sports drink (No red color; diabetics please choose diet or no sugar options)    Take these medicines the morning of surgery with A SIP OF WATER                        bupropion, metoprolol, pantoprazole.     Do not wear jewelry, make-up or nail polish, including gel polish,  artificial nails, or any other type of covering on natural nails (fingers and  toes).  Do not wear lotions, powders, or perfumes, or deodorant.  Do not shave 48 hours prior to surgery.  Men may shave face and neck.  Do not bring valuables to the hospital.  [facility] is not responsible for any belongings or valuables.  Contacts, dentures or bridgework may not be worn into surgery.  Leave your suitcase in the car.  After surgery it may be brought to your room.  For patients admitted to the hospital, discharge time will be determined by your treatment team.  Patients discharged the day of surgery will not be allowed to drive home and must have someone with them for 24 hours.     Special instructions:   DO NOT smoke tobacco or vape for 24 hours before your procedure.  Please read over the following fact sheets that you were given. Anesthesia Post-op Instructions and Care and Recovery After Surgery      Upper Endoscopy, Adult, Care After After the procedure, it is common to have a sore throat. It is also common to have: Mild stomach pain or discomfort. Bloating. Nausea. Follow these instructions at home: The instructions below may help you care for yourself at home. Your health care provider may give you more instructions. If you have questions, ask your health care provider. If you were given a sedative during the procedure, it can affect you for several hours. Do not drive or operate machinery until your health care provider says that it is safe. If you will be going home right after the procedure, plan to have a responsible adult: Take you home from the hospital or clinic. You will not be allowed to drive. Care for you for the time you are told. Follow instructions from your health care provider about what you may eat and drink. Return to your normal activities as told by your health care provider. Ask your health  care provider what activities are safe for you. Take over-the-counter and prescription medicines only as told by your health care provider. Contact a health care provider if you: Have a sore throat that lasts longer than one day. Have trouble swallowing. Have a fever. Get help right away if you: Vomit blood or your vomit looks like coffee grounds. Have bloody, black, or tarry stools. Have a very bad sore throat or you cannot swallow. Have difficulty breathing or very bad pain in your chest or abdomen. These symptoms may be an emergency. Get help right away. Call 911. Do not wait to see if the symptoms will go away. Do not drive yourself to the hospital. Summary After the procedure, it is common to have a sore throat, mild stomach  discomfort, bloating, and nausea. If you were given a sedative during the procedure, it can affect you for several hours. Do not drive until your health care provider says that it is safe. Follow instructions from your health care provider about what you may eat and drink. Return to your normal activities as told by your health care provider. This information is not intended to replace advice given to you by your health care provider. Make sure you discuss any questions you have with your health care provider. Document Revised: 06/25/2021 Document Reviewed: 06/25/2021 Elsevier Patient Education  2024 [employer]. Monitored Anesthesia Care, Care After The following information offers guidance on how to care for yourself after your procedure. Your health care provider may also give you more specific instructions. If you have problems or questions, contact your health care provider. What can I expect after the procedure? After the procedure, it is common to have: Tiredness. Little or no memory about what happened during or after the procedure. Impaired judgment when it comes to making decisions. Nausea or vomiting. Some trouble with balance. Follow these instructions at home: For the time period you were told by your health care provider:  Rest. Do not participate in activities where you could fall or become injured. Do not drive or use machinery. Do not drink alcohol. Do not take sleeping pills or medicines that cause drowsiness. Do not make important decisions or sign legal documents. Do not take care of children on your own. Medicines Take over-the-counter and prescription medicines only as told by your health care provider. If you were prescribed antibiotics, take them as told by your health care provider. Do not stop using the antibiotic even if you start to feel better. Eating and drinking Follow instructions from your health care provider about what you may eat and drink. Drink  enough fluid to keep your urine pale yellow. If you vomit: Drink clear fluids slowly and in small amounts as you are able. Clear fluids include water, ice chips, low-calorie sports drinks, and fruit juice that has water added to it (diluted fruit juice). Eat light and bland foods in small amounts as you are able. These foods include bananas, applesauce, rice, lean meats, toast, and crackers. General instructions  Have a responsible adult stay with you for the time you are told. It is important to have someone help care for you until you are awake and alert. If you have sleep apnea, surgery and some medicines can increase your risk for breathing problems. Follow instructions from your health care provider about wearing your sleep device: When you are sleeping. This includes during daytime naps. While taking prescription pain medicines, sleeping medicines, or medicines that make you drowsy. Do not use any products  that contain nicotine or tobacco. These products include cigarettes, chewing tobacco, and vaping devices, such as e-cigarettes. If you need help quitting, ask your health care provider. Contact a health care provider if: You feel nauseous or vomit every time you eat or drink. You feel light-headed. You are still sleepy or having trouble with balance after 24 hours. You get a rash. You have a fever. You have redness or swelling around the IV site. Get help right away if: You have trouble breathing. You have new confusion after you get home. These symptoms may be an emergency. Get help right away. Call 911. Do not wait to see if the symptoms will go away. Do not drive yourself to the hospital. This information is not intended to replace advice given to you by your health care provider. Make sure you discuss any questions you have with your health care provider. Document Revised: 08/11/2021 Document Reviewed: 08/11/2021 Elsevier Patient Education  2024 [employer].

## 2023-03-04 ENCOUNTER — Other Ambulatory Visit: Payer: Self-pay

## 2023-03-04 ENCOUNTER — Encounter (HOSPITAL_COMMUNITY)
Admission: RE | Admit: 2023-03-04 | Discharge: 2023-03-04 | Disposition: A | Discharge status: Home / Self Care | Payer: BC Managed Care – PPO | Source: Ambulatory Visit | Attending: Internal Medicine | Admitting: Internal Medicine

## 2023-03-04 ENCOUNTER — Encounter (HOSPITAL_COMMUNITY): Payer: Self-pay

## 2023-03-04 DIAGNOSIS — Z6834 Body mass index (BMI) 34.0-34.9, adult: Secondary | ICD-10-CM

## 2023-03-04 DIAGNOSIS — K759 Inflammatory liver disease, unspecified: Secondary | ICD-10-CM | POA: Diagnosis not present

## 2023-03-04 DIAGNOSIS — Z7984 Long term (current) use of oral hypoglycemic drugs: Secondary | ICD-10-CM | POA: Diagnosis not present

## 2023-03-04 DIAGNOSIS — F419 Anxiety disorder, unspecified: Secondary | ICD-10-CM | POA: Diagnosis not present

## 2023-03-04 DIAGNOSIS — Z6835 Body mass index (BMI) 35.0-35.9, adult: Secondary | ICD-10-CM | POA: Diagnosis not present

## 2023-03-04 DIAGNOSIS — Z13810 Encounter for screening for upper gastrointestinal disorder: Secondary | ICD-10-CM | POA: Diagnosis present

## 2023-03-04 DIAGNOSIS — E119 Type 2 diabetes mellitus without complications: Secondary | ICD-10-CM | POA: Diagnosis not present

## 2023-03-04 DIAGNOSIS — I10 Essential (primary) hypertension: Secondary | ICD-10-CM | POA: Diagnosis not present

## 2023-03-04 DIAGNOSIS — F32A Depression, unspecified: Secondary | ICD-10-CM | POA: Diagnosis not present

## 2023-03-04 DIAGNOSIS — I498 Other specified cardiac arrhythmias: Secondary | ICD-10-CM | POA: Insufficient documentation

## 2023-03-04 DIAGNOSIS — Z01810 Encounter for preprocedural cardiovascular examination: Secondary | ICD-10-CM | POA: Insufficient documentation

## 2023-03-04 DIAGNOSIS — K449 Diaphragmatic hernia without obstruction or gangrene: Secondary | ICD-10-CM | POA: Diagnosis not present

## 2023-03-04 DIAGNOSIS — K219 Gastro-esophageal reflux disease without esophagitis: Secondary | ICD-10-CM | POA: Diagnosis not present

## 2023-03-04 HISTORY — DX: Type 2 diabetes mellitus without complications: E11.9

## 2023-03-05 ENCOUNTER — Ambulatory Visit (HOSPITAL_COMMUNITY)
Admission: RE | Admit: 2023-03-05 | Discharge: 2023-03-05 | Disposition: A | Discharge status: Home / Self Care | Payer: BC Managed Care – PPO | Source: Ambulatory Visit | Attending: Internal Medicine | Admitting: Internal Medicine

## 2023-03-05 ENCOUNTER — Ambulatory Visit (HOSPITAL_COMMUNITY): Payer: BC Managed Care – PPO | Admitting: Certified Registered Nurse Anesthetist

## 2023-03-05 ENCOUNTER — Encounter (HOSPITAL_COMMUNITY)
Admission: RE | Disposition: A | Discharge status: Home / Self Care | Payer: Self-pay | Source: Ambulatory Visit | Attending: Internal Medicine

## 2023-03-05 ENCOUNTER — Encounter (HOSPITAL_COMMUNITY): Payer: Self-pay | Admitting: Internal Medicine

## 2023-03-05 DIAGNOSIS — K449 Diaphragmatic hernia without obstruction or gangrene: Secondary | ICD-10-CM | POA: Insufficient documentation

## 2023-03-05 DIAGNOSIS — E119 Type 2 diabetes mellitus without complications: Secondary | ICD-10-CM | POA: Insufficient documentation

## 2023-03-05 DIAGNOSIS — K7581 Nonalcoholic steatohepatitis (NASH): Secondary | ICD-10-CM | POA: Diagnosis not present

## 2023-03-05 DIAGNOSIS — K7402 Hepatic fibrosis, advanced fibrosis: Secondary | ICD-10-CM

## 2023-03-05 DIAGNOSIS — F419 Anxiety disorder, unspecified: Secondary | ICD-10-CM | POA: Insufficient documentation

## 2023-03-05 DIAGNOSIS — Z7984 Long term (current) use of oral hypoglycemic drugs: Secondary | ICD-10-CM | POA: Insufficient documentation

## 2023-03-05 DIAGNOSIS — Z13810 Encounter for screening for upper gastrointestinal disorder: Secondary | ICD-10-CM | POA: Diagnosis not present

## 2023-03-05 DIAGNOSIS — Z6834 Body mass index (BMI) 34.0-34.9, adult: Secondary | ICD-10-CM

## 2023-03-05 DIAGNOSIS — F32A Depression, unspecified: Secondary | ICD-10-CM | POA: Insufficient documentation

## 2023-03-05 DIAGNOSIS — K759 Inflammatory liver disease, unspecified: Secondary | ICD-10-CM | POA: Insufficient documentation

## 2023-03-05 DIAGNOSIS — I10 Essential (primary) hypertension: Secondary | ICD-10-CM | POA: Insufficient documentation

## 2023-03-05 DIAGNOSIS — K219 Gastro-esophageal reflux disease without esophagitis: Secondary | ICD-10-CM | POA: Insufficient documentation

## 2023-03-05 DIAGNOSIS — Z6835 Body mass index (BMI) 35.0-35.9, adult: Secondary | ICD-10-CM | POA: Insufficient documentation

## 2023-03-05 HISTORY — PX: ESOPHAGOGASTRODUODENOSCOPY (EGD) WITH PROPOFOL: SHX5813

## 2023-03-05 LAB — GLUCOSE, CAPILLARY: Glucose-Capillary: 106 mg/dL — ABNORMAL HIGH (ref 70–99)

## 2023-03-05 SURGERY — ESOPHAGOGASTRODUODENOSCOPY (EGD) WITH PROPOFOL
Anesthesia: General

## 2023-03-05 MED ORDER — PROPOFOL 500 MG/50ML IV EMUL
INTRAVENOUS | Status: AC
  Filled 2023-03-05: qty 50

## 2023-03-05 MED ORDER — LACTATED RINGERS IV SOLN: INTRAVENOUS | Status: DC

## 2023-03-05 MED ORDER — LIDOCAINE HCL (PF) 2 % IJ SOLN
INTRAMUSCULAR | Status: AC
  Filled 2023-03-05: qty 5

## 2023-03-05 MED ORDER — LIDOCAINE HCL (PF) 2 % IJ SOLN
INTRAMUSCULAR | Status: DC | PRN
  Administered 2023-03-05: 50 mg via INTRADERMAL

## 2023-03-05 MED ORDER — EPHEDRINE 5 MG/ML INJ
INTRAVENOUS | Status: AC
  Filled 2023-03-05: qty 5

## 2023-03-05 MED ORDER — PROPOFOL 10 MG/ML IV BOLUS
INTRAVENOUS | Status: DC | PRN
  Administered 2023-03-05: 80 mg via INTRAVENOUS
  Administered 2023-03-05: 180 ug/kg/min via INTRAVENOUS

## 2023-03-05 MED ORDER — SODIUM CHLORIDE 0.9 % IV SOLN: INTRAVENOUS | Status: DC

## 2023-03-05 NOTE — Anesthesia Preprocedure Evaluation (Signed)
Anesthesia Evaluation  Patient identified by MRN, date of birth, ID band Patient awake    Reviewed: Allergy & Precautions, H&P , NPO status , Patient's Chart, lab work & pertinent test results, reviewed documented beta blocker date and time   Airway Mallampati: II  TM Distance: >3 FB Neck ROM: full    Dental no notable dental hx.    Pulmonary neg pulmonary ROS   Pulmonary exam normal breath sounds clear to auscultation       Cardiovascular Exercise Tolerance: Good hypertension,  Rhythm:regular Rate:Normal     Neuro/Psych  PSYCHIATRIC DISORDERS Anxiety Depression    negative neurological ROS     GI/Hepatic ,GERD  ,,(+) Hepatitis -  Endo/Other  diabetes  Class 3 obesity  Renal/GU negative Renal ROS  negative genitourinary   Musculoskeletal   Abdominal   Peds  Hematology  (+) Blood dyscrasia, anemia   Anesthesia Other Findings   Reproductive/Obstetrics negative OB ROS                             Anesthesia Physical Anesthesia Plan  ASA: 3  Anesthesia Plan: General   Post-op Pain Management:    Induction:   PONV Risk Score and Plan: Propofol infusion  Airway Management Planned:   Additional Equipment:   Intra-op Plan:   Post-operative Plan:   Informed Consent: I have reviewed the patients History and Physical, chart, labs and discussed the procedure including the risks, benefits and alternatives for the proposed anesthesia with the patient or authorized representative who has indicated his/her understanding and acceptance.     Dental Advisory Given  Plan Discussed with: CRNA  Anesthesia Plan Comments:        Anesthesia Quick Evaluation

## 2023-03-05 NOTE — Transfer of Care (Signed)
Immediate Anesthesia Transfer of Care Note  Patient: Loretta Schaefer  Procedure(s) Performed: ESOPHAGOGASTRODUODENOSCOPY (EGD) WITH PROPOFOL  Patient Location: Short Stay  Anesthesia Type:General  Level of Consciousness: awake, alert , and oriented  Airway & Oxygen Therapy: Patient Spontanous Breathing  Post-op Assessment: Report given to RN, Post -op Vital signs reviewed and stable, and Patient moving all extremities X 4  Post vital signs: Reviewed and stable  Last Vitals:  Vitals Value Taken Time  BP 114/54   Temp 98.0   Pulse 94   Resp 22   SpO2 97     Last Pain:  Vitals:   03/05/23 1234  TempSrc: Oral  PainSc: 0-No pain         Complications: No notable events documented.

## 2023-03-05 NOTE — H&P (Addendum)
@LOGO @   Primary Care Physician:  Ignatius Specking, MD Primary Gastroenterologist:  Dr. Jena Gauss  Pre-Procedure History & Physical: HPI:  Loretta Schaefer is a 52 y.o. female here for screening EGD.  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  No upper GI tract symptoms.  Denies dysphagia  Past Medical History:  Diagnosis Date   Anxiety    Condyloma    Depression    Diabetes mellitus without complication (HCC)    Fibrocystic changes of left breast 05/29/2016   GERD (gastroesophageal reflux disease)    History of pyelonephritis 2003   right   Hyperplastic colon polyp 06/25/2010   colonoscopy by Dr. Jena Gauss   Hypertension    history of,is not on any medicqtions   NASH (nonalcoholic steatohepatitis)    Peri-menopause 02/06/2014   Prediabetes    RA (rheumatoid arthritis) (HCC)     Past Surgical History:  Procedure Laterality Date   ABDOMINAL HYSTERECTOMY     BREAST BIOPSY Left 03/2016   [facility]   CESAREAN SECTION     x 2   COLONOSCOPY  05/2010   normal TI, hyperplastic rectal polyp, friable anal canal   COLONOSCOPY N/A 07/11/2015   One 5 mm polyp in the descending colon, s/p resection and retrieval. Internal hemorrhoids. Hyperplastic polyp. Surveillance 2022.    COLONOSCOPY WITH PROPOFOL N/A 09/26/2020   Surgeon: Corbin Ade, MD;   6 mm polyp in the ileocecal valve removed.  Pathology with tubular adenoma.  Recommended 5-year repeat.   ESOPHAGOGASTRODUODENOSCOPY  05/2010   distal ERE, small hh   ESOPHAGOGASTRODUODENOSCOPY N/A 11/13/2013   UEA:[id number] hiatal hernia; otherwise, negative EGD.Nostigmata of cirrhosis foundPatient may not have cirrhosis at   FOOT SURGERY     LIVER BIOPSY  2000's   [facility]   PANNICULECTOMY  2005   POLYPECTOMY  09/26/2020   Procedure: POLYPECTOMY;  Surgeon: Corbin Ade, MD;  Location: [facility];  Service: Endoscopy;;   RADIOACTIVE SEED GUIDED EXCISIONAL BREAST BIOPSY Left 10/08/2016    Procedure: RADIOACTIVE SEED GUIDED EXCISIONAL LEFT  BREAST BIOPSY;  Surgeon: Emelia Loron, MD;  Location: [facility] OR;  Service: General;  Laterality: Left;   S/P Hysterectomy  2005   SHOULDER SURGERY Right 02/2022   debridement   TOE SURGERY     surgery on left big toe at joint   WISDOM TOOTH EXTRACTION      Prior to Admission medications   Medication Sig Start Date End Date Taking? Authorizing Provider  buPROPion (WELLBUTRIN XL) 150 MG 24 hr tablet Take 150 mg by mouth daily with lunch.   Yes [provider]  busPIRone (BUSPAR) 5 MG tablet Take 5 mg by mouth. 07/03/22  Yes [provider]  metFORMIN (GLUCOPHAGE) 500 MG tablet Take 500 mg by mouth 2 (two) times daily with a meal. 07/03/22  Yes [provider]  metoprolol succinate (TOPROL-XL) 25 MG 24 hr tablet Take 25 mg by mouth every evening. 06/28/20  Yes [provider]  pantoprazole (PROTONIX) 40 MG tablet TAKE 1 TABLET BY MOUTH EVERY DAY 30 MINUTES BEFORE BREAKFAST 12/16/22  Yes Tiffany Kocher, PA-C  rosuvastatin (CRESTOR) 10 MG tablet Take 10 mg by mouth every evening.   Yes [provider]  Vitamin D, Ergocalciferol, (DRISDOL) 1.25 MG (50000 UNIT) CAPS capsule Take 50,000 Units by mouth every Saturday. 06/11/19  Yes [provider]  vitamin E 400 UNIT capsule Take 400 Units by mouth daily.   Yes [provider]  ibuprofen (ADVIL,MOTRIN) 200 MG tablet Take 400 mg by mouth every 6 (six) hours as needed for headache or moderate pain.    [provider]  loratadine (CLARITIN) 10 MG tablet Take 10 mg by mouth daily.    [provider]  Omega-3 Fatty Acids (FISH OIL) 1000 MG CAPS Take 1,000 mg by mouth daily.    [provider]    Allergies as of 02/03/2023 - Review Complete 11/20/2022  Allergen Reaction Noted   Omnipaque [iohexol] Hives and Itching 06/02/2010    Family History  Problem Relation Age of Onset   Colon polyps Mother        less than  age 66   Cirrhosis Mother        NASH, deceased   Depression Mother    Diabetes Mother    Heart disease Mother    Cirrhosis Maternal Aunt        NASH   Bipolar disorder Sister    Asthma Sister    Depression Sister        bipolar   COPD Sister    Asthma Brother    Diabetes Sister    Hypertension Sister    Other Sister        degenerative disc in back   Supraventricular tachycardia Son    Asthma Son    Heart attack Father    COPD Father    Emphysema Father    Dementia Father     Social History   Socioeconomic History   Marital status: Married    Spouse name: Not on file   Number of children: Not on file   Years of education: Not on file   Highest education level: Not on file  Occupational History   Not on file  Tobacco Use   Smoking status: Never   Smokeless tobacco: Never  Vaping Use   Vaping status: Never Used  Substance and Sexual Activity   Alcohol use: No   Drug use: No   Sexual activity: Yes    Birth control/protection: Surgical    Comment: hyst  Other Topics Concern   Not on file  Social History Narrative   Not on file   Social Determinants of Health   Financial Resource Strain: Low Risk  (10/08/2022)   Overall Financial Resource Strain (CARDIA)    Difficulty of Paying Living Expenses: Not hard at all  Food Insecurity: No Food Insecurity (10/08/2022)   Hunger Vital Sign    Worried About Running Out of Food in the Last Year: Never true    Ran Out of Food in the Last Year: Never true  Transportation Needs: No Transportation Needs (10/08/2022)   PRAPARE - Administrator, Civil Service (Medical): No    Lack of Transportation (Non-Medical): No  Physical Activity: Inactive (10/08/2022)   Exercise Vital Sign    Days of Exercise per Week: 0 days    Minutes of Exercise per Session: 0 min  Stress: Stress Concern Present (10/08/2022)   [employer] of Occupational Health - Occupational Stress Questionnaire    Feeling of Stress : To some  extent  Social Connections: Moderately Isolated (10/08/2022)   Social Connection and Isolation Panel [NHANES]    Frequency of Communication with Friends and Family: More than three times a week    Frequency of Social Gatherings with Friends and Family: More than three times a week    Attends Religious Services: Never    Database administrator or Organizations: No  Attends Banker Meetings: Never    Marital Status: Married  Catering manager Violence: Not At Risk (10/08/2022)   Humiliation, Afraid, Rape, and Kick questionnaire    Fear of Current or Ex-Partner: No    Emotionally Abused: No    Physically Abused: No    Sexually Abused: No    Review of Systems: See HPI, otherwise negative ROS  Physical Exam: BP 133/88   Pulse 78   Temp 98.2 F (36.8 C) (Oral)   Resp 16   Ht 5' (1.524 m)   Wt 81.7 kg   SpO2 100%   BMI 35.18 kg/m  General:   Alert,  Well-developed, well-nourished, pleasant and cooperative in NAD Neck:  Supple; no masses or thyromegaly. No significant cervical adenopathy. Lungs:  Clear throughout to auscultation.   No wheezes, crackles, or rhonchi. No acute distress. Heart:  Regular rate and rhythm; no murmurs, clicks, rubs,  or gallops. Abdomen: Non-distended, normal bowel sounds.  Soft and nontender without appreciable mass or hepatosplenomegaly.   Impression/Plan: 52 year old lady with advanced fibrosis history of MASLD here for screening EGD.   The risks, benefits, limitations, alternatives and imponderables have been reviewed with the patient. Potential for esophageal dilation, biopsy, etc. have also been reviewed.  Questions have been answered. All parties agreeable.      Notice: This dictation was prepared with Dragon dictation along with smaller phrase technology. Any transcriptional errors that result from this process are unintentional and may not be corrected upon review.

## 2023-03-05 NOTE — Op Note (Signed)
[facility] Patient Name: Loretta Schaefer Procedure Date: 03/05/2023 1:57 PM MRN: [id number] Date of Birth: Feb 18, 1971 Attending MD: Gennette Pac , MD, [id number] CSN: [id number] Age: 52 Admit Type: Outpatient Procedure:                Upper GI endoscopy Indications:              Screening procedure, Hepatitis with suspected                            esophageal varices Providers:                Gennette Pac, MD, Nena Polio, RN, Lennice Sites Technician, Technician Referring MD:              Medicines:                Propofol per Anesthesia Complications:            No immediate complications. Estimated Blood Loss:     Estimated blood loss: none. Procedure:                Pre-Anesthesia Assessment:                           - Prior to the procedure, a History and Physical                            was performed, and patient medications and                            allergies were reviewed. The patient's tolerance of                            previous anesthesia was also reviewed. The risks                            and benefits of the procedure and the sedation                            options and risks were discussed with the patient.                            All questions were answered, and informed consent                            was obtained. Prior Anticoagulants: The patient has                            taken no anticoagulant or antiplatelet agents. ASA                            Grade Assessment: II - A patient with mild systemic  disease. After reviewing the risks and benefits,                            the patient was deemed in satisfactory condition to                            undergo the procedure.                           After obtaining informed consent, the endoscope was                            passed under direct vision. Throughout the                            procedure, the  patient's blood pressure, pulse, and                            oxygen saturations were monitored continuously. The                            [id number]-[id number] ([id number]) scope was introduced through the                            mouth, and advanced to the second part of duodenum.                            The upper GI endoscopy was accomplished without                            difficulty. The patient tolerated the procedure                            well. Scope In: 2:13:36 PM Scope Out: 2:16:39 PM Total Procedure Duration: 0 hours 3 minutes 3 seconds  Findings:      The examined esophagus was normal.      A small hiatal hernia was present.      The duodenal bulb and second portion of the duodenum were normal. Impression:               - Normal esophagus.                           - Small hiatal hernia.                           - Normal duodenal bulb and second portion of the                            duodenum.                           - No specimens collected. Moderate Sedation:      Moderate (conscious) sedation was personally administered by an       anesthesia professional. The following parameters were monitored: oxygen  saturation, heart rate, blood pressure, respiratory rate, EKG, adequacy       of pulmonary ventilation, and response to care. Recommendation:           - Patient has a contact number available for                            emergencies. The signs and symptoms of potential                            delayed complications were discussed with the                            patient. Return to normal activities tomorrow.                            Written discharge instructions were provided to the                            patient.                           - Advance diet as tolerated.                           - Continue present medications. Timing of any                            repeat EGD is to be determined. Procedure Code(s):        --- Professional  ---                           [phone], Esophagogastroduodenoscopy, flexible,                            transoral; diagnostic, including collection of                            specimen(s) by brushing or washing, when performed                            (separate procedure) Diagnosis Code(s):        --- Professional ---                           K44.9, Diaphragmatic hernia without obstruction or                            gangrene                           Z13.810, Encounter for screening for upper                            gastrointestinal disorder                           K75.9, Inflammatory liver disease,  unspecified CPT copyright 2022 American Medical Association. All rights reserved. The codes documented in this report are preliminary and upon coder review may  be revised to meet current compliance requirements. Gerrit Friends. Hurbert Duran, MD Gennette Pac, MD 03/05/2023 2:59:34 PM This report has been signed electronically. Number of Addenda: 0

## 2023-03-05 NOTE — Anesthesia Postprocedure Evaluation (Signed)
Anesthesia Post Note  Patient: Loretta Schaefer  Procedure(s) Performed: ESOPHAGOGASTRODUODENOSCOPY (EGD) WITH PROPOFOL  Patient location during evaluation: Phase II Anesthesia Type: General Level of consciousness: awake Pain management: pain level controlled Vital Signs Assessment: post-procedure vital signs reviewed and stable Respiratory status: spontaneous breathing and respiratory function stable Cardiovascular status: blood pressure returned to baseline and stable Postop Assessment: no headache and no apparent nausea or vomiting Anesthetic complications: no Comments: Late entry   No notable events documented.   Last Vitals:  Vitals:   03/05/23 1234 03/05/23 1421  BP: 133/88 (!) 114/54  Pulse: 78 98  Resp: 16 (!) 22  Temp: 36.8 C 36.7 C  SpO2: 100% 98%    Last Pain:  Vitals:   03/05/23 1421  TempSrc: Oral  PainSc: 0-No pain                 Windell Norfolk

## 2023-03-05 NOTE — Discharge Instructions (Signed)
EGD Discharge instructions Please read the instructions outlined below and refer to this sheet in the next few weeks. These discharge instructions provide you with general information on caring for yourself after you leave the hospital. Your doctor may also give you specific instructions. While your treatment has been planned according to the most current medical practices available, unavoidable complications occasionally occur. If you have any problems or questions after discharge, please call your doctor. ACTIVITY You may resume your regular activity but move at a slower pace for the next 24 hours.  Take frequent rest periods for the next 24 hours.  Walking will help expel (get rid of) the air and reduce the bloated feeling in your abdomen.  No driving for 24 hours (because of the anesthesia (medicine) used during the test).  You may shower.  Do not sign any important legal documents or operate any machinery for 24 hours (because of the anesthesia used during the test).  NUTRITION Drink plenty of fluids.  You may resume your normal diet.  Begin with a light meal and progress to your normal diet.  Avoid alcoholic beverages for 24 hours or as instructed by your caregiver.  MEDICATIONS You may resume your normal medications unless your caregiver tells you otherwise.  WHAT YOU CAN EXPECT TODAY You may experience abdominal discomfort such as a feeling of fullness or "gas" pains.  FOLLOW-UP Your doctor will discuss the results of your test with you.  SEEK IMMEDIATE MEDICAL ATTENTION IF ANY OF THE FOLLOWING OCCUR: Excessive nausea (feeling sick to your stomach) and/or vomiting.  Severe abdominal pain and distention (swelling).  Trouble swallowing.  Temperature over 101 F (37.8 C).  Rectal bleeding or vomiting of blood.    Small hiatal hernia otherwise negative EGD today.  Good news!  Office visit with Tana Coast in 6 weeks  Repeat EGD in the future to be determined.  At patient  request, I called Deerica Moehle at [phone] -reviewed findings and recommendations

## 2023-03-15 ENCOUNTER — Encounter (HOSPITAL_COMMUNITY): Payer: Self-pay | Admitting: Internal Medicine

## 2023-04-22 NOTE — Progress Notes (Signed)
GI Office Note    Referring Provider: Ignatius Specking, MD Primary Care Physician:  Ignatius Specking, MD  Primary Gastroenterologist: Roetta Sessions, MD   Chief Complaint   Chief Complaint  Patient presents with   Follow-up    Follow up after EGD, pt states she is doing better    History of Present Illness   Loretta Schaefer is a 53 y.o. female presenting today for follow-up.  Last seen in August.  She has a history of GERD, NASH (biopsy in 2000) undergoing yearly ultrasound and monitoring of LFTs every 6 months.  Mother and aunt both had NASH cirrhosis. Prior ultrasound with elastography showing F4 back in 2016 but at that time her fibrosis labs indicated F0.  EGD August 2015 with no evidence of portal hypertension.   Since her last office visit she completed repeat right upper quadrant ultrasound with elastography, noted to have median K PA of 3.8.  Unlikely to have advanced liver disease based on this finding.  Labs from October 2024: FibroTest with fibrosis stage 0.  Ferritin 9, TIBC 433, iron 71, iron sat 16%, INR 1, total bilirubin 0.3, alk phos 67, AST 24, ALT 22, albumin 4.1, white blood cell count 6200, hemoglobin 13.3, platelets 269,000.  Labs from January 2025: Hemoglobin 13.3, hematocrit 41.8, MCV 83, total bilirubin 0.5, alkaline phosphatase 89, AST 38, ALT 38 high  Right upper quadrant ultrasound elastography November 2024: IMPRESSION: ULTRASOUND RUQ: Echogenic liver compatible with hepatic steatosis. ULTRASOUND HEPATIC ELASTOGRAPHY: Median kPa:  3.8    EGD December 2024: for recurrent IDA, GERD -Normal esophagus -Small hiatal hernia-normal duodenal bulb and second portion of duodenum   Today: biggest issue is fatigue. She is not sleeping well either. Has a hard time shutting off her brain at night, worrying about different things. She continues to struggle with her weight. Up 9 pounds over the holidays. Reflux is going well. No dysphagia. No n/v. No abdominal pain.  BMs are good.   She was unable to restart iron, typically makes her sick. Tried a couple of different OTC irons.       Previous work up:   Colonoscopy up-to-date June 2022 with 6 mm polyp at the ileocecal valve removed.  Pathology with tubular adenoma.  Recommended 5-year surveillance.   EGD 10/2013: small hiatal hernia, no evidence of portal hypertension  2024: [place] with elastography, median kPa of 3.8, high probability of being normal. Quality of study with some reduced accuracy given IQR/Median kPa ratio: 0.5 2024: Fibrotest F0 2016: Liver fibrosis/fibrotest F0 2016 [place] with elastography, Metavir score F3/F4 2015 [place] with elastography, Metavir score F4   Right upper quadrant ultrasound November 2023: Steatosis, no cholelithiasis.   Labs from October 2023: LFTs normal, immunoglobulins unremarkable, ANA negative, AMA negative, ASMA negative, TTG IgA normal, CBC normal, ferritin 9, iron sat 17%, TIBC 418, iron 73.  iFOBT negative.  Recommended ferrous sulfate 325 mg daily.  Follow-up labs in December with ferritin of 14, hemoglobin 12.8.   Labs from February 2024: Hemoglobin 13.8, platelets 271,000, total bilirubin 0.5, alkaline phosphatase 71, AST 31, ALT 45, ferritin 16, hepatitis C antibody negative,   Completed hepatitis A and B vaccinations remotely.   Liver bx: NASH, 2000    Medications   Current Outpatient Medications  Medication Sig Dispense Refill   buPROPion (WELLBUTRIN XL) 150 MG 24 hr tablet Take 150 mg by mouth daily with lunch.     busPIRone (BUSPAR) 5 MG tablet Take 5 mg by  mouth.     ibuprofen (ADVIL,MOTRIN) 200 MG tablet Take 400 mg by mouth every 6 (six) hours as needed for headache or moderate pain.     loratadine (CLARITIN) 10 MG tablet Take 10 mg by mouth daily.     metFORMIN (GLUCOPHAGE) 500 MG tablet Take 500 mg by mouth 2 (two) times daily with a meal.     metoprolol succinate (TOPROL-XL) 25 MG 24 hr tablet Take 25 mg by mouth every evening.     Omega-3  Fatty Acids (FISH OIL) 1000 MG CAPS Take 1,000 mg by mouth daily.     pantoprazole (PROTONIX) 40 MG tablet TAKE 1 TABLET BY MOUTH EVERY DAY 30 MINUTES BEFORE BREAKFAST 90 tablet 3   rosuvastatin (CRESTOR) 10 MG tablet Take 10 mg by mouth every evening.     Vitamin D, Ergocalciferol, (DRISDOL) 1.25 MG (50000 UNIT) CAPS capsule Take 50,000 Units by mouth every Saturday.     vitamin E 400 UNIT capsule Take 400 Units by mouth daily.     No current facility-administered medications for this visit.    Allergies   Allergies as of 04/23/2023 - Review Complete 03/05/2023  Allergen Reaction Noted   Omnipaque [iohexol] Hives and Itching 06/02/2010      Review of Systems   General: Negative for anorexia, weight loss, fever, chills, +fatigue, weakness. ENT: Negative for hoarseness, difficulty swallowing , nasal congestion. CV: Negative for chest pain, angina, palpitations, dyspnea on exertion, peripheral edema.  Respiratory: Negative for dyspnea at rest, dyspnea on exertion, cough, sputum, wheezing.  GI: See history of present illness. GU:  Negative for dysuria, hematuria, urinary incontinence, urinary frequency, nocturnal urination.  Endo: Negative for unusual weight change.     Physical Exam   There were no vitals taken for this visit.   General: Well-nourished, well-developed in no acute distress.  Eyes: No icterus. Mouth: Oropharyngeal mucosa moist and pink   Lungs: Clear to auscultation bilaterally.  Heart: Regular rate and rhythm, no murmurs rubs or gallops.  Abdomen: Bowel sounds are normal, nontender, nondistended, no hepatosplenomegaly or masses,  no abdominal bruits or hernia , no rebound or guarding.  Rectal: not performed  Extremities: No lower extremity edema. No clubbing or deformities. Neuro: Alert and oriented x 4   Skin: Warm and dry, no jaundice.   Psych: Alert and cooperative, normal mood and affect.  Labs   See hpi  Imaging Studies   No results  found.  Assessment/Plan:   NASH: -strive for slow gradual weight loss -increase daily exercis -tight glycemic control -limit etoh to rare occasions -recent elastography and fibrotest reassuring, point away from significant fibrosis but with family history of NASH cirrhosis, we will continue to monitor closely -discussed possible referral to [facility]. She will let me know if she wants to pursue -return ov in six months.   GERD: doing well -continue pantoprazole 40mg  daily  IDA: -hgb normal but ferritin trending downward -colonoscopy 2022 -EGD recently updated -recheck iron/tibc/ferritin now. Then to decide if MVI with iron vs RX iron due to intolerability to OTC iron   Leanna Battles. Melvyn Neth, MHS, PA-C [facility]

## 2023-04-23 ENCOUNTER — Encounter: Payer: Self-pay | Admitting: Gastroenterology

## 2023-04-23 ENCOUNTER — Ambulatory Visit: Payer: BC Managed Care – PPO | Admitting: Gastroenterology

## 2023-04-23 VITALS — BP 151/83 | HR 61 | Temp 98.6°F | Ht 60.0 in | Wt 189.0 lb

## 2023-04-23 DIAGNOSIS — D509 Iron deficiency anemia, unspecified: Secondary | ICD-10-CM | POA: Diagnosis not present

## 2023-04-23 DIAGNOSIS — K219 Gastro-esophageal reflux disease without esophagitis: Secondary | ICD-10-CM

## 2023-04-23 DIAGNOSIS — K7581 Nonalcoholic steatohepatitis (NASH): Secondary | ICD-10-CM | POA: Diagnosis not present

## 2023-04-23 DIAGNOSIS — Z8379 Family history of other diseases of the digestive system: Secondary | ICD-10-CM

## 2023-04-23 NOTE — Patient Instructions (Signed)
Update labs today for iron deficiency. You can go to [facility] on [address] across from [facility]. We will see you in six months for follow up on your fatty liver. Let me know if you would like referral to [facility].

## 2023-04-24 LAB — IRON,TIBC AND FERRITIN PANEL
Ferritin: 18 ng/mL (ref 15–150)
Iron Saturation: 20 % (ref 15–55)
Iron: 90 ug/dL (ref 27–159)
Total Iron Binding Capacity: 450 ug/dL (ref 250–450)
UIBC: 360 ug/dL (ref 131–425)

## 2023-07-30 ENCOUNTER — Other Ambulatory Visit (HOSPITAL_COMMUNITY): Payer: Self-pay | Admitting: Adult Health

## 2023-07-30 DIAGNOSIS — Z1231 Encounter for screening mammogram for malignant neoplasm of breast: Secondary | ICD-10-CM

## 2023-08-12 ENCOUNTER — Other Ambulatory Visit: Payer: Self-pay | Admitting: Gastroenterology

## 2023-09-24 ENCOUNTER — Ambulatory Visit (HOSPITAL_COMMUNITY)
Admission: RE | Admit: 2023-09-24 | Discharge: 2023-09-24 | Disposition: A | Discharge status: Home / Self Care | Source: Ambulatory Visit | Attending: Adult Health | Admitting: Adult Health

## 2023-09-24 DIAGNOSIS — Z1231 Encounter for screening mammogram for malignant neoplasm of breast: Secondary | ICD-10-CM | POA: Diagnosis present

## 2023-09-28 ENCOUNTER — Ambulatory Visit: Payer: Self-pay | Admitting: Adult Health

## 2023-10-22 ENCOUNTER — Encounter: Payer: Self-pay | Admitting: Gastroenterology

## 2023-10-22 ENCOUNTER — Ambulatory Visit: Payer: BC Managed Care – PPO | Admitting: Gastroenterology

## 2023-10-22 VITALS — BP 137/84 | HR 64 | Temp 98.0°F | Ht 60.0 in | Wt 186.6 lb

## 2023-10-22 DIAGNOSIS — K219 Gastro-esophageal reflux disease without esophagitis: Secondary | ICD-10-CM

## 2023-10-22 DIAGNOSIS — K7581 Nonalcoholic steatohepatitis (NASH): Secondary | ICD-10-CM | POA: Diagnosis not present

## 2023-10-22 DIAGNOSIS — D509 Iron deficiency anemia, unspecified: Secondary | ICD-10-CM | POA: Diagnosis not present

## 2023-10-22 NOTE — Patient Instructions (Addendum)
 Please update your labs. We will be in touch with results as available.  Continue pantoprazole  daily before breakfast.  Keep working at your diabetes management, controlling your cholesterol, and weight loss efforts. Try to be as active as possible. Work towards brisk walking 30 minutes at least five days per week.   Eat low fat/low carb diet. Make sure you get adequate protein, take in at least 30 grams of protein with each meal. Incorporate protein in with your snacks too. Goal of minimum of 100 grams of protein each day.   Drink plenty of fluids. Try to drink at least 96 ounces of water  each day.   Return office visit in six months.

## 2023-10-22 NOTE — Progress Notes (Signed)
 GI Office Note    Referring Provider: Rosamond Leta NOVAK, MD Primary Care Physician:  Rosamond Leta NOVAK, MD  Primary Gastroenterologist: Ozell Hollingshead, MD   Chief Complaint   Chief Complaint  Patient presents with   Follow-up    Doing well, no issues    History of Present Illness   Loretta Schaefer is a 53 y.o. female presenting today for follow-up.  Last seen in January 2025.  She has a history of GERD, NASH (biopsy in the 2000s) undergoing yearly ultrasound and monitoring of LFTs every 6 months.  Both her mother and maternal aunt had NASH cirrhosis.  Prior ultrasound with elastography in 2016 showed F4 but at that time her fibrosis labs indicated F0.  EGD August 2015 with no evidence of portal hypertension.  She had a repeat ultrasound with elastography November 2024 with evidence of hepatic steatosis, median K PA of 3.8.  In October 2024 liver fibrosis score with fibrosis stage F0, necroinflammatory grade of a 0.  Her iron was 71, iron sat 16%, ferritin 9, hemoglobin 13.3, platelets 269,000.  We offered upper endoscopy at that time, which was completed, see below.  She has had multiple Hemoccult negative stools.  Repeat labs in January 2025, ferritin of 18.  Iron 90, TIBC 450, iron sats 20%.  Total bilirubin 0.5, alkaline phosphatase 89, AST 38, ALT 38 high.  Today: Semaglutide 2mg  weekly, started in March, trying to get covered by insurance. Tolerating. Some appetite suppression. Has seen improvement with A1C, went from 7.9 into the 6 range. She has not seen significant weight loss, only 3 pounds per our records since 03/2023.   She denies abdominal pain. Heartburn controlled. BMs regular. No melena, brbpr. No edema. She feels some bloating but believes it is due to her central obesity.   Does not tolerate oral iron.   EGD December 2024: for recurrent IDA, GERD -Normal esophagus -Small hiatal hernia-normal duodenal bulb and second portion of duodenum   Previous work up:    Colonoscopy up-to-date June 2022 with 6 mm polyp at the ileocecal valve removed.  Pathology with tubular adenoma.  Recommended 5-year surveillance.   EGD 10/2013: small hiatal hernia, no evidence of portal hypertension   2024: US  with elastography, median kPa of 3.8, high probability of being normal. Quality of study with some reduced accuracy given IQR/Median kPa ratio: 0.5 2024: Fibrotest F0 2016: Liver fibrosis/fibrotest F0 2016 US  with elastography, Metavir score F3/F4 2015 US  with elastography, Metavir score F4   Right upper quadrant ultrasound November 2023: Steatosis, no cholelithiasis.   Labs from October 2023: LFTs normal, immunoglobulins unremarkable, ANA negative, AMA negative, ASMA negative, TTG IgA normal, CBC normal, ferritin 9, iron sat 17%, TIBC 418, iron 73.  iFOBT negative.  Recommended ferrous sulfate 325 mg daily.  Follow-up labs in December with ferritin of 14, hemoglobin 12.8.   Labs from February 2024: Hemoglobin 13.8, platelets 271,000, total bilirubin 0.5, alkaline phosphatase 71, AST 31, ALT 45, ferritin 16, hepatitis C antibody negative,   Completed hepatitis A and B vaccinations remotely.   Liver bx: NASH, at [facility] remotely, per prior notes was in the 2000s but report is not available     Medications   Current Outpatient Medications  Medication Sig Dispense Refill   buPROPion  (WELLBUTRIN  XL) 150 MG 24 hr tablet Take 150 mg by mouth daily with lunch.     ibuprofen (ADVIL,MOTRIN) 200 MG tablet Take 400 mg by mouth every 6 (six) hours as needed for  headache or moderate pain.     loratadine (CLARITIN) 10 MG tablet Take 10 mg by mouth daily.     metFORMIN (GLUCOPHAGE-XR) 500 MG 24 hr tablet Take 500 mg by mouth 2 (two) times daily.     metoprolol succinate (TOPROL-XL) 25 MG 24 hr tablet Take 25 mg by mouth every evening.     metroNIDAZOLE (METROGEL) 0.75 % gel Apply 1 Application topically 2 (two) times daily.     Omega-3 Fatty Acids (FISH OIL) 1000 MG CAPS Take  1,000 mg by mouth daily.     pantoprazole  (PROTONIX ) 40 MG tablet TAKE 1 TABLET BY MOUTH EVERY DAY 30 MINUTES BEFORE BREAKFAST 90 tablet 3   rosuvastatin (CRESTOR) 10 MG tablet Take 10 mg by mouth every evening.     Semaglutide (OZEMPIC, 2 MG/DOSE, [place]) Inject 2 mg into the skin once a week.     Vitamin D , Ergocalciferol , (DRISDOL) 1.25 MG (50000 UNIT) CAPS capsule Take 50,000 Units by mouth every Saturday.     vitamin E 400 UNIT capsule Take 400 Units by mouth daily.     No current facility-administered medications for this visit.    Allergies   Allergies as of 10/22/2023 - Review Complete 10/22/2023  Allergen Reaction Noted   Omnipaque  [iohexol ] Hives and Itching 06/02/2010     Review of Systems   General: Negative for anorexia, weight loss, fever, chills, fatigue, weakness. ENT: Negative for hoarseness, difficulty swallowing , nasal congestion. CV: Negative for chest pain, angina, palpitations, dyspnea on exertion, peripheral edema.  Respiratory: Negative for dyspnea at rest, dyspnea on exertion, cough, sputum, wheezing.  GI: See history of present illness. GU:  Negative for dysuria, hematuria, urinary incontinence, urinary frequency, nocturnal urination.  Endo: Negative for unusual weight change.     Physical Exam   BP 137/84 (BP Location: Right Arm, Patient Position: Sitting, Cuff Size: Large)   Pulse 64   Temp 98 F (36.7 C) (Oral)   Ht 5' (1.524 m)   Wt 186 lb 9.6 oz (84.6 kg)   SpO2 99%   BMI 36.44 kg/m    General: Well-nourished, well-developed in no acute distress.  Eyes: No icterus. Mouth: Oropharyngeal mucosa moist and pink   Lungs: Clear to auscultation bilaterally.  Heart: Regular rate and rhythm, no murmurs rubs or gallops.  Abdomen: Bowel sounds are normal, nontender, nondistended, no hepatosplenomegaly or masses,  no abdominal bruits or hernia , no rebound or guarding.  Rectal: not performed Extremities: No lower extremity edema. No clubbing or  deformities. Neuro: Alert and oriented x 4   Skin: Warm and dry, no jaundice.   Psych: Alert and cooperative, normal mood and affect.  Labs   See hpi Imaging Studies   MM 3D SCREENING MAMMOGRAM BILATERAL BREAST Result Date: 09/28/2023 CLINICAL DATA:  Screening. EXAM: DIGITAL SCREENING BILATERAL MAMMOGRAM WITH TOMOSYNTHESIS AND CAD TECHNIQUE: Bilateral screening digital craniocaudal and mediolateral oblique mammograms were obtained. Bilateral screening digital breast tomosynthesis was performed. The images were evaluated with computer-aided detection. COMPARISON:  Previous exam(s). ACR Breast Density Category a: The breasts are almost entirely fatty. FINDINGS: There are no findings suspicious for malignancy. IMPRESSION: No mammographic evidence of malignancy. A result letter of this screening mammogram will be mailed directly to the patient. RECOMMENDATION: Screening mammogram in one year. (Code:SM-B-01Y) BI-RADS CATEGORY  1: Negative. Electronically Signed   By: Reyes Phi M.D.   On: 09/28/2023 09:58    Assessment/Plan:    NASH: -strive for slow gradual weight loss, she is now on  ozempic. Discussed dietary changes she can make to improve weight loss efforts -increase daily exercise, goal of brisk walking 30 minutes 5 days a week -tight glycemic control, she has improved A1C on ozempic -limit etoh to rare occasions -recent elastography and fibrotest reassuring, points away from significant fibrosis but with family history of NASH cirrhosis, we will continue to monitor closely  -return ov in six months.    GERD: doing well -continue pantoprazole  40mg  daily   IDA: -hgb normal but ferritin trending downward last year, improved in 03/2023 -colonoscopy 2022 -EGD 02/2023 -recheck iron/tibc/ferritin now   Sonny RAMAN. Ezzard, MHS, PA-C [facility]

## 2023-10-23 ENCOUNTER — Ambulatory Visit: Payer: Self-pay | Admitting: Gastroenterology

## 2023-10-23 LAB — IRON,TIBC AND FERRITIN PANEL
Ferritin: 16 ng/mL (ref 15–150)
Iron Saturation: 19 % (ref 15–55)
Iron: 78 ug/dL (ref 27–159)
Total Iron Binding Capacity: 421 ug/dL (ref 250–450)
UIBC: 343 ug/dL (ref 131–425)

## 2023-10-23 LAB — CBC WITH DIFFERENTIAL/PLATELET
Basophils Absolute: 0.1 x10E3/uL (ref 0.0–0.2)
Basos: 1 %
EOS (ABSOLUTE): 0.3 x10E3/uL (ref 0.0–0.4)
Eos: 5 %
Hematocrit: 42.7 % (ref 34.0–46.6)
Hemoglobin: 13.6 g/dL (ref 11.1–15.9)
Immature Grans (Abs): 0 x10E3/uL (ref 0.0–0.1)
Immature Granulocytes: 0 %
Lymphocytes Absolute: 2.6 x10E3/uL (ref 0.7–3.1)
Lymphs: 41 %
MCH: 26.7 pg (ref 26.6–33.0)
MCHC: 31.9 g/dL (ref 31.5–35.7)
MCV: 84 fL (ref 79–97)
Monocytes Absolute: 0.5 x10E3/uL (ref 0.1–0.9)
Monocytes: 7 %
Neutrophils Absolute: 3 x10E3/uL (ref 1.4–7.0)
Neutrophils: 46 %
Platelets: 281 x10E3/uL (ref 150–450)
RBC: 5.09 x10E6/uL (ref 3.77–5.28)
RDW: 14.2 % (ref 11.7–15.4)
WBC: 6.4 x10E3/uL (ref 3.4–10.8)

## 2023-10-23 LAB — COMPREHENSIVE METABOLIC PANEL WITH GFR
ALT: 28 IU/L (ref 0–32)
AST: 27 IU/L (ref 0–40)
Albumin: 4.5 g/dL (ref 3.8–4.9)
Alkaline Phosphatase: 79 IU/L (ref 44–121)
BUN/Creatinine Ratio: 10 (ref 9–23)
BUN: 9 mg/dL (ref 6–24)
Bilirubin Total: 0.5 mg/dL (ref 0.0–1.2)
CO2: 20 mmol/L (ref 20–29)
Calcium: 9.8 mg/dL (ref 8.7–10.2)
Chloride: 104 mmol/L (ref 96–106)
Creatinine, Ser: 0.9 mg/dL (ref 0.57–1.00)
Globulin, Total: 2.6 g/dL (ref 1.5–4.5)
Glucose: 97 mg/dL (ref 70–99)
Potassium: 4.6 mmol/L (ref 3.5–5.2)
Sodium: 142 mmol/L (ref 134–144)
Total Protein: 7.1 g/dL (ref 6.0–8.5)
eGFR: 75 mL/min/1.73 (ref >59)

## 2023-12-10 ENCOUNTER — Ambulatory Visit: Admitting: Gastroenterology

## 2023-12-23 ENCOUNTER — Encounter: Payer: Self-pay | Admitting: Adult Health

## 2023-12-23 ENCOUNTER — Ambulatory Visit: Admitting: Adult Health

## 2023-12-23 VITALS — BP 150/78 | HR 77 | Ht 60.0 in | Wt 185.0 lb

## 2023-12-23 DIAGNOSIS — R232 Flushing: Secondary | ICD-10-CM

## 2023-12-23 DIAGNOSIS — Z90710 Acquired absence of both cervix and uterus: Secondary | ICD-10-CM

## 2023-12-23 DIAGNOSIS — Z01419 Encounter for gynecological examination (general) (routine) without abnormal findings: Secondary | ICD-10-CM | POA: Diagnosis not present

## 2023-12-23 DIAGNOSIS — F32A Depression, unspecified: Secondary | ICD-10-CM

## 2023-12-23 DIAGNOSIS — I10 Essential (primary) hypertension: Secondary | ICD-10-CM

## 2023-12-23 DIAGNOSIS — F419 Anxiety disorder, unspecified: Secondary | ICD-10-CM | POA: Diagnosis not present

## 2023-12-23 DIAGNOSIS — N898 Other specified noninflammatory disorders of vagina: Secondary | ICD-10-CM | POA: Diagnosis not present

## 2023-12-23 DIAGNOSIS — N941 Unspecified dyspareunia: Secondary | ICD-10-CM

## 2023-12-23 MED ORDER — ESCITALOPRAM OXALATE 10 MG PO TABS: 10.0000 mg | ORAL_TABLET | Freq: Every day | ORAL | 2 refills | Status: AC

## 2023-12-23 MED ORDER — PREMARIN 0.625 MG/GM VA CREA
TOPICAL_CREAM | VAGINAL | 1 refills | Status: AC
Start: 2023-12-23 — End: ?

## 2023-12-23 NOTE — Progress Notes (Signed)
 Patient ID: LAKEA MITTELMAN, female   DOB: 07-14-1970, 53 y.o.   MRN: [id number] History of Present Illness:  Loretta Schaefer is a 53 year old white female, married, sp hysterectomy in for a well woman gyn exam.   PCP is Dr  Rosamond.  Current Medications, Allergies, Past Medical History, Past Surgical History, Family History and Social History were reviewed in [employer] record.     Review of Systems: Patient denies any  hearing loss, fatigue, blurred vision, shortness of breath, chest pain, abdominal pain, problems with bowel movements.  No joint pain or mood swings.  Has occasional headaches  Has occasional hot flash  Has noticed change in vision, sees eye doctor today Has urinary frequency  Has pain with sex, feels dry Has different sensation with swallowing a times, has see Dr Shaaron   Physical Exam:BP (!) 150/78 (BP Location: Left Arm, Patient Position: Sitting, Cuff Size: Normal)   Pulse 77   Ht 5' (1.524 m)   Wt 185 lb (83.9 kg)   BMI 36.13 kg/m   General:  Well developed, well nourished, no acute distress Skin:  Warm and dry Neck:  Midline trachea, normal thyroid , good ROM, no lymphadenopathy Lungs; Clear to auscultation bilaterally Breast:  No dominant palpable mass, retraction, or nipple discharge Cardiovascular: Regular rate and rhythm Abdomen:  Soft, non tender, no hepatosplenomegaly Pelvic:  External genitalia is normal in appearance, no lesions.  The vagina is pale. Urethra has no lesions or masses. The cervix and uterus are absent. No adnexal masses or tenderness noted.Bladder is non tender, no masses felt. Rectal: Deferred Extremities/musculoskeletal:  No swelling or varicosities noted, no clubbing or cyanosis Psych:  No mood changes, alert and cooperative,seems happy, but was teary when asked about depression(has moved sister to assisted living) AA is 0 Fall risk I slow    12/23/2023    9:33 AM 10/08/2022    8:32 AM 07/21/2021    9:58 AM  Depression  screen PHQ 2/9  Decreased Interest 1 1 1   Down, Depressed, Hopeless 1 1 1   PHQ - 2 Score 2 2 2   Altered sleeping 1 1 1   Tired, decreased energy 2 3 3   Change in appetite 1 1 3   Feeling bad or failure about yourself  1 1 1   Trouble concentrating 1 1 2   Moving slowly or fidgety/restless 0 0 0  Suicidal thoughts 0 0 0  PHQ-9 Score 8 9 12   Difficult doing work/chores   Very difficult       12/23/2023    9:34 AM 10/08/2022    8:33 AM 11/03/2019   11:43 AM  GAD 7 : Generalized Anxiety Score  Nervous, Anxious, on Edge 1 1 2   Control/stop worrying 1 2 1   Worry too much - different things 1 3 2   Trouble relaxing 2 3 2   Restless 0 1 1  Easily annoyed or irritable 1 1 2   Afraid - awful might happen 0 0 0  Total GAD 7 Score 6 11 10   Anxiety Difficulty   Not difficult at all      Upstream - 12/23/23 0930       Pregnancy Intention Screening   Does the patient want to become pregnant in the next year? N/A    Does the patient's partner want to become pregnant in the next year? N/A    Would the patient like to discuss contraceptive options today? N/A      Contraception Wrap Up   Current Method Female  Sterilization   hyst   End Method Female Sterilization   hyst   Contraception Counseling Provided No          Examination chaperoned by Clarita Salt LPN  Impression and plan: 1. Encounter for well woman exam with routine gynecological exam (Primary) Physical in 1 year Labs with PCP Mammogram was negative 09/24/23 Colonoscopy per GI   2. S/P hysterectomy  3. Dyspareunia, female Will rx premarin  vaginal cream Meds ordered this encounter  Medications   conjugated estrogens  (PREMARIN ) vaginal cream    Sig: Use 0.5 gm in vagina at bedtime for 2 weeks then go to 2 x weekly    Dispense:  42.5 g    Refill:  1    Supervising Provider:   JAYNE MINDER H [[id number]]   escitalopram  (LEXAPRO ) 10 MG tablet    Sig: Take 1 tablet (10 mg total) by mouth daily.    Dispense:  30 tablet    Refill:   2    Supervising Provider:   JAYNE, LUTHER H [[id number]]     4. Hypertension, unspecified type Continue BP meds and follow up with PCP  5. Anxiety and depression Will try lexapro  10 mg 1 daily Follow up in 4 weeks for ROS   6. Hot flashes  7. Vaginal dryness Will rx PVC

## 2024-01-20 ENCOUNTER — Ambulatory Visit: Admitting: Adult Health

## 2024-06-09 ENCOUNTER — Ambulatory Visit: Admitting: Gastroenterology
